# Patient Record
Sex: Male | Born: 1960 | Race: White | Hispanic: No | Marital: Married | Smoking: Former smoker
Health system: Southern US, Community
[De-identification: ages and names within clinical notes are randomized; demographics above are authoritative.]

## PROBLEM LIST (undated history)

## (undated) DIAGNOSIS — R609 Edema, unspecified: Secondary | ICD-10-CM

## (undated) DIAGNOSIS — E119 Type 2 diabetes mellitus without complications: Secondary | ICD-10-CM

## (undated) DIAGNOSIS — D649 Anemia, unspecified: Secondary | ICD-10-CM

## (undated) DIAGNOSIS — J90 Pleural effusion, not elsewhere classified: Secondary | ICD-10-CM

## (undated) DIAGNOSIS — Z8719 Personal history of other diseases of the digestive system: Secondary | ICD-10-CM

## (undated) DIAGNOSIS — E669 Obesity, unspecified: Secondary | ICD-10-CM

## (undated) DIAGNOSIS — I219 Acute myocardial infarction, unspecified: Secondary | ICD-10-CM

## (undated) DIAGNOSIS — E7800 Pure hypercholesterolemia, unspecified: Secondary | ICD-10-CM

## (undated) DIAGNOSIS — I10 Essential (primary) hypertension: Secondary | ICD-10-CM

## (undated) DIAGNOSIS — I5032 Chronic diastolic (congestive) heart failure: Secondary | ICD-10-CM

## (undated) DIAGNOSIS — I2510 Atherosclerotic heart disease of native coronary artery without angina pectoris: Secondary | ICD-10-CM

## (undated) DIAGNOSIS — I4819 Other persistent atrial fibrillation: Secondary | ICD-10-CM

## (undated) DIAGNOSIS — R140 Abdominal distension (gaseous): Secondary | ICD-10-CM

## (undated) HISTORY — DX: Abdominal distension (gaseous): R14.0

## (undated) HISTORY — DX: Atherosclerotic heart disease of native coronary artery without angina pectoris: I25.10

## (undated) HISTORY — DX: Acute myocardial infarction, unspecified: I21.9

## (undated) HISTORY — DX: Type 2 diabetes mellitus without complications: E11.9

## (undated) HISTORY — DX: Pleural effusion, not elsewhere classified: J90

## (undated) HISTORY — DX: Obesity, unspecified: E66.9

## (undated) HISTORY — DX: Essential (primary) hypertension: I10

## (undated) HISTORY — DX: Personal history of other diseases of the digestive system: Z87.19

## (undated) HISTORY — DX: Edema, unspecified: R60.9

## (undated) HISTORY — DX: Anemia, unspecified: D64.9

## (undated) HISTORY — DX: Pure hypercholesterolemia, unspecified: E78.00

## (undated) HISTORY — DX: Other persistent atrial fibrillation: I48.19

---

## 1994-07-01 HISTORY — PX: CORONARY ANGIOPLASTY: SHX604

## 2001-11-13 ENCOUNTER — Ambulatory Visit (HOSPITAL_COMMUNITY): Admission: RE | Admit: 2001-11-13 | Discharge: 2001-11-13 | Payer: Self-pay | Admitting: Family Medicine

## 2001-11-13 ENCOUNTER — Encounter: Payer: Self-pay | Admitting: Family Medicine

## 2002-06-02 ENCOUNTER — Other Ambulatory Visit: Admission: RE | Admit: 2002-06-02 | Discharge: 2002-06-02 | Payer: Self-pay | Admitting: Dermatology

## 2003-02-22 ENCOUNTER — Emergency Department (HOSPITAL_COMMUNITY): Admission: EM | Admit: 2003-02-22 | Discharge: 2003-02-23 | Payer: Self-pay | Admitting: *Deleted

## 2005-10-24 ENCOUNTER — Encounter: Payer: Self-pay | Admitting: Cardiovascular Disease

## 2005-10-25 ENCOUNTER — Inpatient Hospital Stay (HOSPITAL_COMMUNITY): Admission: AD | Admit: 2005-10-25 | Discharge: 2005-10-29 | Payer: Self-pay | Admitting: Cardiovascular Disease

## 2006-07-01 HISTORY — PX: CARDIAC CATHETERIZATION: SHX172

## 2006-07-01 HISTORY — PX: CORONARY ARTERY BYPASS GRAFT: SHX141

## 2007-04-07 ENCOUNTER — Ambulatory Visit (HOSPITAL_COMMUNITY): Admission: RE | Admit: 2007-04-07 | Discharge: 2007-04-07 | Payer: Self-pay | Admitting: *Deleted

## 2007-04-14 ENCOUNTER — Ambulatory Visit: Payer: Self-pay | Admitting: Surgery

## 2007-04-16 ENCOUNTER — Ambulatory Visit: Payer: Self-pay | Admitting: Surgery

## 2007-04-16 ENCOUNTER — Inpatient Hospital Stay (HOSPITAL_COMMUNITY): Admission: AD | Admit: 2007-04-16 | Discharge: 2007-04-26 | Payer: Self-pay | Admitting: Surgery

## 2007-05-11 ENCOUNTER — Ambulatory Visit (HOSPITAL_COMMUNITY): Admission: RE | Admit: 2007-05-11 | Discharge: 2007-05-11 | Payer: Self-pay | Admitting: Cardiovascular Disease

## 2007-05-12 ENCOUNTER — Ambulatory Visit: Payer: Self-pay | Admitting: Surgery

## 2007-05-27 ENCOUNTER — Ambulatory Visit (HOSPITAL_COMMUNITY): Admission: RE | Admit: 2007-05-27 | Discharge: 2007-05-27 | Payer: Self-pay | Admitting: Cardiovascular Disease

## 2007-06-01 ENCOUNTER — Ambulatory Visit (HOSPITAL_COMMUNITY): Admission: RE | Admit: 2007-06-01 | Discharge: 2007-06-01 | Payer: Self-pay | Admitting: Cardiovascular Disease

## 2007-06-02 ENCOUNTER — Ambulatory Visit: Payer: Self-pay | Admitting: Thoracic Surgery (Cardiothoracic Vascular Surgery)

## 2007-06-02 ENCOUNTER — Encounter
Admission: RE | Admit: 2007-06-02 | Discharge: 2007-06-02 | Payer: Self-pay | Admitting: Thoracic Surgery (Cardiothoracic Vascular Surgery)

## 2007-06-16 ENCOUNTER — Encounter
Admission: RE | Admit: 2007-06-16 | Discharge: 2007-06-16 | Payer: Self-pay | Admitting: Thoracic Surgery (Cardiothoracic Vascular Surgery)

## 2007-06-16 ENCOUNTER — Ambulatory Visit: Payer: Self-pay | Admitting: Thoracic Surgery (Cardiothoracic Vascular Surgery)

## 2007-06-22 ENCOUNTER — Encounter (HOSPITAL_COMMUNITY): Admission: RE | Admit: 2007-06-22 | Discharge: 2007-07-01 | Payer: Self-pay | Admitting: Cardiovascular Disease

## 2007-07-03 ENCOUNTER — Encounter (HOSPITAL_COMMUNITY): Admission: RE | Admit: 2007-07-03 | Discharge: 2007-08-02 | Payer: Self-pay | Admitting: Cardiovascular Disease

## 2007-07-20 ENCOUNTER — Ambulatory Visit (HOSPITAL_COMMUNITY): Admission: RE | Admit: 2007-07-20 | Discharge: 2007-07-20 | Payer: Self-pay | Admitting: Cardiovascular Disease

## 2007-08-20 ENCOUNTER — Ambulatory Visit (HOSPITAL_COMMUNITY): Admission: RE | Admit: 2007-08-20 | Discharge: 2007-08-20 | Payer: Self-pay | Admitting: Cardiovascular Disease

## 2008-03-14 ENCOUNTER — Ambulatory Visit: Payer: Self-pay | Admitting: Internal Medicine

## 2008-05-04 ENCOUNTER — Ambulatory Visit: Payer: Self-pay | Admitting: Internal Medicine

## 2009-02-27 DIAGNOSIS — Z862 Personal history of diseases of the blood and blood-forming organs and certain disorders involving the immune mechanism: Secondary | ICD-10-CM | POA: Insufficient documentation

## 2009-02-27 DIAGNOSIS — K219 Gastro-esophageal reflux disease without esophagitis: Secondary | ICD-10-CM | POA: Insufficient documentation

## 2009-02-27 DIAGNOSIS — R197 Diarrhea, unspecified: Secondary | ICD-10-CM | POA: Insufficient documentation

## 2009-02-27 DIAGNOSIS — R12 Heartburn: Secondary | ICD-10-CM | POA: Insufficient documentation

## 2009-02-28 ENCOUNTER — Ambulatory Visit: Payer: Self-pay | Admitting: Internal Medicine

## 2009-03-02 ENCOUNTER — Encounter: Payer: Self-pay | Admitting: Internal Medicine

## 2009-03-20 ENCOUNTER — Ambulatory Visit: Payer: Self-pay | Admitting: Internal Medicine

## 2009-03-20 ENCOUNTER — Ambulatory Visit (HOSPITAL_COMMUNITY): Admission: RE | Admit: 2009-03-20 | Discharge: 2009-03-20 | Payer: Self-pay | Admitting: Internal Medicine

## 2009-03-20 ENCOUNTER — Encounter: Payer: Self-pay | Admitting: Internal Medicine

## 2009-04-03 ENCOUNTER — Encounter: Payer: Self-pay | Admitting: Internal Medicine

## 2009-05-01 ENCOUNTER — Ambulatory Visit: Payer: Self-pay | Admitting: Gastroenterology

## 2009-05-11 ENCOUNTER — Encounter: Payer: Self-pay | Admitting: Internal Medicine

## 2009-05-22 ENCOUNTER — Ambulatory Visit (HOSPITAL_COMMUNITY): Admission: RE | Admit: 2009-05-22 | Discharge: 2009-05-22 | Payer: Self-pay | Admitting: Internal Medicine

## 2009-05-23 ENCOUNTER — Telehealth: Payer: Self-pay | Admitting: Urgent Care

## 2009-05-31 ENCOUNTER — Telehealth (INDEPENDENT_AMBULATORY_CARE_PROVIDER_SITE_OTHER): Payer: Self-pay | Admitting: *Deleted

## 2010-07-22 ENCOUNTER — Encounter: Payer: Self-pay | Admitting: Surgery

## 2010-10-05 LAB — OVA AND PARASITE EXAMINATION: Ova and parasites: NONE SEEN

## 2010-10-05 LAB — CLOSTRIDIUM DIFFICILE EIA: C difficile Toxins A+B, EIA: NEGATIVE

## 2010-10-05 LAB — FECAL LACTOFERRIN, QUANT

## 2010-10-05 LAB — STOOL CULTURE

## 2010-11-13 NOTE — Assessment & Plan Note (Signed)
OFFICE VISIT   [name], [name]  DOB:  [birth year]                                        June 02, 2007  CHART #:  [id number]   ADDENDUM:  The patient has pointed out that he has had persistent hypertension and  elevated heart rate since his surgery.  He says every time he has been  checked his blood pressure has been elevated, and his heart rate has  been around 100.  He has been on Lopressor 25 [name].i.d.  I am going to  increase his Lopressor dose to 50 [name].i.d. to see if we can get his blood  pressure and heart rate more under control.   Salvatore Decent Dorris Fetch, M.D.  Electronically Signed   SCH/MEDQ  D:  06/02/2007  T:  06/02/2007  Job:  [id number]

## 2010-11-13 NOTE — Consult Note (Signed)
[name], [name]                 ACCOUNT NO.:  [id number]   MEDICAL RECORD NO.:  [id number]          PATIENT TYPE:  INP   LOCATION:  2032                         FACILITY:  [facility]   PHYSICIAN:  Alfonse Alpers. Gegick, M.D.DATE OF BIRTH:  05/15/1961   DATE OF CONSULTATION:  DATE OF DISCHARGE:                                 CONSULTATION   HISTORY:  This is a 50 year old man admitted to the hospital with a  history of arteriosclerotic heart disease.  Approximately 1-1/2 years  ago he had an evaluation for coronary disease and was found to have  atherosclerotic disease and was treated with one stent.  Since then, he  has had an increased number of stents to be replaced and is scheduled  for a coronary artery bypass graft.   His risk factors primarily revolve around a history of metabolic  syndrome associated with cigarette use.  He used to smoke a pack and a  half of cigarettes per day but stopped 18 months ago.  He has a history  of diabetes which has been present for approximately 10 years.  This has  never been well controlled.  He also has a history of dyslipidemia for  which he is taking Lipitor and Niaspan.  The patient also has a history  of hypertension which apparently has been controlled.  His A1c is 8.4 in  the hospital, and his blood sugars range usually less than 200 in the  hospital at this time.   PERSONAL HISTORY:  He used to smoke but discontinued this a year and a  half ago.  He denies excessive alcoholic intake.  No history of  allergies.   REVIEW OF SYSTEMS:  His weight has increased approximately 15-20 pounds  over the last year and a half.  He does not know what his A1c's have  been during the interim either.  GI:  No complaints.  GU:  No  complaints.   PHYSICAL EXAMINATION:  GENERAL:  This is a well-developed obese man in  no distress.  LUNGS:  Clear.  HEART:  Normal.  ABDOMEN:  Negative.   IMPRESSION:  1. Arteriosclerotic heart disease.  2. Diabetes  mellitus, type 2.  3. History of hypertension.  4. History of dyslipidemia.   MEDICATIONS PRIOR TO ADMISSION:  Plavix, Lopressor, Isordil, Avandia 8  mg daily, glipizide 5 mg daily, metformin 1000 mg b.i.d.   DISCUSSION:  He has type 2 diabetes associated with his coronary artery  disease.  The metabolic syndrome is exacerbated by his history of  cigarette use.  At this time, I would change some of his medications for  the reasons below.  Avandia will be switched to Actos.  There are  several studies now that have raised some question about Avandia with  reference to coronary artery disease.  This will be switched to Actos.  In addition, the Lipitor should be at maximum dose which it is now at 80  mg.  Niaspan will be discontinued and started later if needed.  Hopefully with the use of Actos, we will decrease the triglycerides and  increase  the HDL such that we will not need the  Niaspan.  Niaspan will increase insulin resistance.  Glipizide will also  be discontinued.  Again with this medication, there are some questions  about its role in coronary artery disease.  Januvia will be replaced  instead.  Thank you for the opportunity in seeing this patient.  We will  follow this patient.           ______________________________  Alfonse Alpers. Dagoberto Ligas, M.D.     CGG/MEDQ  D:  04/18/2007  T:  04/20/2007  Job:  [id number]

## 2010-11-13 NOTE — Assessment & Plan Note (Signed)
OFFICE VISIT   ROLLEN, SELDERS  DOB:  [birth year]                                        June 16, 2007  CHART #:  [id number]   Mr. Stellmach is a 50 year old gentleman who had coronary bypass grafting  x4 by Dr. Laneta Simmers back in October.  He had been seen by Dr. Laneta Simmers in  November, at which time he had a little bit of left basilar atelectasis,  but was really making good progress.  He subsequently returned in early  December with a large left pleural effusion.  I saw him at that time as  Dr. Laneta Simmers was out, and recommended a thoracentesis.  We had to stop  that short because he experienced a great deal of discomfort, but we  were able to remove approximately 50% - 60% of the pleural fluid, which  was about 800 cc.  He then was treated with diuretics.  He states that  since that time, he has had marked improvement in his breathing and has  not noted any shortness of breath or orthopnea.  He still has Lasix  tablets.  He says he takes one about every 2-3 days, when he notices  that his weight is increasing and he has noted some peripheral edema as  well.   PHYSICAL EXAMINATION:  On physical examination, Mr. Schue is a well-  appearing 50 year old white male, in no acute distress.  His blood  pressure is 138/95, pulse 99, respirations are 18, his oxygen saturation  is 98% on room air.  Lungs are essentially equal bilaterally, with no  wheezing.  There are some minimal crackles in the left base.  There is  trace peripheral edema.   His chest x-ray shows marked improvement of his left pleural effusion.  There is still a small residual effusion, with some blunting of the  costophrenic angle, but overall there is much improved aeration of the  left lung.   IMPRESSION:  Mr. Elizondo is doing well at this point in time.  His left  pleural effusion has improved markedly with thoracentesis and diuretics.  He still is taking diuretics on an as-needed basis, watching  his weight.  He does not need any further specific followup regarding the pleural  effusion, but should just continue to follow up with Dr. [name] as  scheduled.  We would be happy to see him back at any time if we could be  of any further assistance with his care.   Salvatore Decent Dorris Fetch, M.D.  Electronically Signed   SCH/MEDQ  D:  06/16/2007  T:  06/16/2007  Job:  [id number]   cc:   Evelene Croon, M.D.  Richard A. [name], M.D.  Mila Homer. Sudie Bailey, M.D.

## 2010-11-13 NOTE — Assessment & Plan Note (Signed)
OFFICE VISIT   [name], [name]  DOB:  1961-06-25                                        June 02, 2007  CHART #:  [id number]   The patient is a 50 year old gentleman who had coronary artery bypass  grafting x4 on October 22 of this year by Dr. Laneta Simmers.  He had some  postoperative atrial fibrillation and was discharged home on amiodarone.  He returns today because he has been having persistent problems with his  breathing.  He does get short of breath.  He does have some orthopnea  and some cough although that has not been severe.  He continued to use  his incentive spirometer, and actually says over the last few days, he  has noted some improvement.  He was noted about a week ago to have a  large left pleural effusion on chest x-ray and had his Lasix dose  increased.  He also had some peripheral edema at that time.  His  peripheral edema since has resolved, but other than some slight  improvement, his shortness of breath has not.   PHYSICAL EXAMINATION:  The patient is a 50 year old white male in no  acute distress.  His blood pressure is 148/93, pulse 102, respirations  18, oxygen saturation 97% on room air.  His lungs have diminished breath  sounds with dullness to percussion at the left base.  Otherwise, clear.  There is no peripheral edema.   Chest x-ray shows a moderately large left pleural effusion with  compressive atelectasis.   IMPRESSION:  The patient is a 50 year old gentleman who is now about 6  weeks out from coronary artery bypass grafting by Dr. Laneta Simmers.  He has a  left pleural effusion.  He has had some improvement with increasing  diuretics, but the effusion has not changed in size since his diuretics  were increased and I recommend that he have a left thoracentesis to  evacuate the fluid.  I discussed with them the indications, risks,  benefits, and alternatives.  They understand the risk, including  bleeding and pneumothorax.  They do  understand that there is the  possibility of need for repeat thoracentesis in approximately 10% of  patients.   PROCEDURE NOTE:  Using local anesthetic and sterile technique, left  thoracentesis was performed.  Approximately 600 mL of fluid was removed  and the patient became light-headed and very anxious.  The procedure was  terminated at that point.  We will proceed with a chest x-ray and then  plan to follow him up in 2 weeks with a repeat chest x-ray.  He will  continue with the 80 mg of Lasix [name].i.d. in the interim.   Salvatore Decent Dorris Fetch, M.D.  Electronically Signed   SCH/MEDQ  D:  06/02/2007  T:  06/02/2007  Job:  [id number]   cc:   Gerlene Burdock A. [name], M.D.  Mila Homer. Sudie Bailey, M.D.

## 2010-11-13 NOTE — Consult Note (Signed)
NEW PATIENT CONSULTATION   [name]  DOB:  03-11-1961                                        April 14, 2007  CHART #:  [id number]   REASON FOR CONSULTATION:  Severe three-vessel coronary artery disease  with unstable angina.   CLINICAL HISTORY:  I was asked to evaluate this gentleman for  consideration of coronary artery bypass graft surgery, by Dr. Pearletha Furl.  [name].  He is a 50 year old gentleman with a history of premature  coronary artery disease starting at age 51 in 81, when he underwent a  stenting for a high-grade proximal LAD stenosis with a bare metal stent.  He had suffered an anterior wall subendocardial myocardial infarction at  that time.  He was treated medically but had recurrent angina in 2007,  and underwent repeat catheterization on October 25, 2005, showing widely  patent proximal LAD stent with progression of disease in his mid-LAD to  85%-90% stenosis, as well as 95% stenosis in the mid-posterolateral  branch.  He underwent staged intervention with a TAXUS stent to the  proximal right coronary artery and mid-posterolateral branch.  On October 28, 2005, he underwent overlapping CYPHER stents to the mid-LAD, across  the second diagonal branch.  He has been treated medically since then  and has done well until the past four to six weeks, when he has  developed intermittent substernal chest pain with exertion.  He has also  had bilateral upper extremity aching pain, particularly in his upper  arms.  This initially occurred with exertion, but he has also been  having symptoms at rest.  He does have associated shortness of breath  and some palpitations.  He underwent a cardiac catheterization on  April 10, 2007.  This showed about a 40%-50% stent restenosis in the  proximal LAD stent.  Beyond the stent there was aneurysmal dilatation  with 85% stenosis of the LAD involving the first diagonal branch.  Further down, the stent that was  across the second diagonal branch, has  mild in-stent re-stenosis.  The second diagonal branch itself has about  90% proximal stenosis.  The left circumflex gives off a moderate-sized  first marginal and has a 70%-80% proximal stenosis.  There is a  posterolateral branch that has about 95% proximal stenosis.  The right  coronary artery has 50%-60% proximal and mid-stenosis.  There is less  than 30% stenosis within the posterolateral stent.  The left ventricular  ejection fraction was normal.  The end diastolic pressure was 18.  There  was no gradient across the aortic valve.  There was no mitral  regurgitation.   REVIEW OF SYSTEMS:  GENERAL:  He denies any fevers or chills.  He has  had no recent weight changes, but has gained weight since he quit  smoking in April 2007.  He does report fatigue.  He has continued to  work full-time.  EYES:  Negative.  ENT:  Negative.  ENDOCRINE:  He has  adult onset diabetes since age 63.  He denies hypothyroidism.  CARDIOVASCULAR:  As above.  He has had exertional and rest chest  pressure and pain.  He has had radiation into his upper arms.  He has  had exertional shortness of breath.  He reports palpitations.  He denies  peripheral edema.  RESPIRATORY:  He denies cough  and sputum production.  GI:  He has some reflux symptoms.  He denies nausea and vomiting.  He  has had no melena or bright red blood per rectum.  GENITOURINARY:  He  does have urinary frequency.  He denies dysuria and hematuria.  VASCULAR:  Denies claudication and phlebitis.  NEUROLOGIC:  He has had  some dizziness recently.  He denies any focal weakness or numbness.  He  has never had a TIA or a stroke.  MUSCULOSKELETAL:  He does have joint  pains.  PSYCHIATRIC:  Negative.  HEMATOLOGICAL:  Negative.   ALLERGIES:  PROTONIX causes diarrhea.   CURRENT MEDICATIONS:  1. Cozaar 100 mg daily.  2. Avandia 8 mg daily.  3. A multivitamin daily.  4. Metformin 1000 mg twice daily.  5.  Glucosamine 1500 mg daily.  6. Plavix 75 mg daily.  7. Lipitor 80 mg daily.  8. Naprosyn 500 mg twice daily.  9. Niaspan ER 500 mg twice daily.  10.Glipizide 5 mg daily.  11.Lopressor 25 mg twice daily.  12.Nexium 40 mg daily.  13.Imdur 30 mg daily.  14.Nitroglycerin spray p.r.n.  15.Keflex 500 mg four times daily, which was just recently started for      a flare up of infection under his arms.   PAST MEDICAL HISTORY:  1. Significant for coronary artery disease, as mentioned above.      Status post multiple percutaneous interventions.  2. History of hypertension.  3. History of hyperlipidemia.  4. He is status post ORIF of a left leg fracture in the past with slow      healing.  5. 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  These have always responded to azithromycin.   FAMILY HISTORY:  Strongly positive for coronary artery disease.  His  father has had coronary artery bypass graft surgery, as well as carotid  artery surgery.   SOCIAL HISTORY:  He is married and has one child.  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  He quit smoking in  April 2007, and denies alcohol abuse.  He weighs about 280 pounds and is  6 feet tall.   PHYSICAL EXAMINATION:  VITAL SIGNS:  Blood pressure 145/98, pulse 74 and  regular, respirations 18 and unlabored, oxygen saturation on room air  96%.  GENERAL:  He is an obese, large-framed white male, in no distress.  HEENT:  Normocephalic and atraumatic.  Pupils equal, react to light and  accommodation.  His extraocular muscles are intact.  Throat is clear.  Teeth in good condition.  NECK:  Normal carotid pulses bilaterally.  There are no bruits.  There  is no adenopathy or thyromegaly.  HEART:  A regular rate and rhythm with normal S1 and S2.  There is no  murmur, rub or gallop.  LUNGS:  Clear.  ABDOMEN:  Shows active bowel sounds.  His abdomen is obese.  He does  have  diastasis recti.  There are no palpable masses or organomegaly.  EXTREMITIES:  Shows no peripheral edema.  Pedal pulses palpable  bilaterally.  SKIN:  Warm and dry.  NEUROLOGIC:  Alert and oriented x3.  Motor and sensory exams are grossly  normal.   IMPRESSION/RECOMMENDATIONS:  1. Mr. Venard has severe three-vessel coronary artery disease with      high-grade proximal left anterior descending coronary artery,      diagonal bifurcation stenosis.  He has unstable  anginal symptoms      and is now having chest and upper arm pain at rest.  I agree that      coronary artery bypass graft surgery is the best treatment to      prevent further ischemia and infarction.  He has been on Plavix      chronically for multiple drug-coated stents.  With his unstable      anginal symptoms, I would be hesitant to stop his Plavix prior to      proceeding with surgery.  We will plan to admit him for Integrilin      therapy while his Plavix is discontinued for at least five days.  2. 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  I started him on azithromycin today, to try to      get this treated prior to surgery.  It has always resolved before      with that therapy.   I discussed the operative procedure of coronary artery bypass graft  surgery with him and with his wife.  We discussed alternatives, benefits  and risks including bleeding, blood transfusion, infection, stroke,  myocardial infarction, graft failure, renal failure and death.  He  understands and  would like to proceed with surgery.  We will  plan to admit him on  Thursday for Integrilin therapy and will discontinue his Plavix at that  time and plan to do surgery next Tuesday.   Evelene Croon, M.D.  Electronically Signed   BB/MEDQ  D:  04/14/2007  T:  04/15/2007  Job:  [id number]   cc:   Gerlene Burdock A. [name], M.D.  Mila Homer. Sudie Bailey, M.D.

## 2010-11-13 NOTE — Assessment & Plan Note (Signed)
[name], [name]                  CHART#:  [id number]   DATE:  05/04/2008                       DOB:  1961/03/23   Follow up gastroesophageal reflux disease, diarrhea, iron deficiency  anemia, Hemoccult negative.   The patient returns to see today after orginal consultation on March 14, 2008.  He had problems with diarrhea at least temporarily related to  a variety of proton pump inhibitor agents.  He had now been started on  Aciphex.  We started him on Aciphex 20 mg orally daily.  Nearly, at the  same time Dr. [name] backed off on his Bystolic to one-half of a 10  mg tablet daily.  These maneuvers were associated with essentially  resolution of his diarrhea.  His reflux symptoms are under great control  with the Aciphex.  He has had longstanding reflux symptoms for a number  of years.  No odynophagia.  No dysphagia.  No melena.  No rectal  bleeding.  He has never had his lower GI tract evaluated.  There is no  family history of colon cancer.  Overall, he is doing very well at this  point from a GI standpoint.  He has lost 40 pounds since his bypass  surgery.   CURRENT MEDICATIONS:  See updated list.   ALLERGIES:  No known drug allergies.   PHYSICAL EXAMINATION:  Today, he looks well.  Weight 265, height 6 feet  10 inches, temperature 97.8, BP 118/82, and pulse 60.  A detailed exam  today is deferred.   ASSESSMENT:  1. Gastroesophageal reflux disease, now well controlled on Aciphex 20      mg orally daily.  He is tolerating this agent very well with no      apparent side effects at this time.  2. History of iron deficiency anemia, Hemoccult negative.   No GI evaluation thus far.   RECOMMENDATIONS:  I have recommended to the patient as he go ahead and  have both a diagnostic colonoscopy and an EGD in the near future to  further evaluate his longstanding gastroesophageal reflux disease and  iron deficiency anemia.  Risks, benefits, alternatives, and limitations  of  this approach have been reviewed.  He is the primary caregiver for  his ailing father and has to get some issues taken care of with him  before he signs up for an endoscopic evaluation.  He states that he need  a little time and wanted to get this done between now and  January 2010.  I told him we would be on stand and as soon as he wanted  to proceed we can get him set up at [facility].  He is to let  us know.       Jonathon Bellows, M.D.  Electronically Signed     RMR/MEDQ  D:  05/04/2008  T:  05/04/2008  Job:  [id number]   cc:   Gerlene Burdock A. [name], M.D.

## 2010-11-13 NOTE — Discharge Summary (Signed)
[name], [name]                 ACCOUNT NO.:  [id number]   MEDICAL RECORD NO.:  [id number]          PATIENT TYPE:  INP   LOCATION:  2003                         FACILITY:  [facility]   PHYSICIAN:  Evelene Croon, M.D.     DATE OF BIRTH:  May 19, 1961   DATE OF ADMISSION:  04/16/2007  DATE OF DISCHARGE:  04/26/2007                               DISCHARGE SUMMARY   FINAL DIAGNOSIS:  Severe three vessel coronary artery disease with  unstable angina.   IN-HOSPITAL DIAGNOSES:  1. Postoperative atrial fibrillation.  2. Postoperative acute blood loss anemia.  3. Volume overload postoperatively.   SECONDARY DIAGNOSES:  1. Hypertension.  2. Dyslipidemia.  3. Diabetes mellitus type 2.   IN-HOSPITAL OPERATIONS AND PROCEDURES:  1. Coronary artery bypass grafting x4 using a left internal mammary      artery graft to left anterior descending coronary artery, saphenous      vein graft to first diagonal branch of the left anterior descending      artery, saphenous vein graft to first obtuse marginal branch of      left circumflex coronary artery, saphenous vein graft to posterior      descending coronary artery.  2. Endoscopic vein harvesting from right leg.   HISTORY AND PHYSICAL AND HOSPITAL COURSE:  The patient is a 50 year old  gentleman referred by Dr. [name] who has a history of premature  coronary artery disease starting at age 67 in 89 when he underwent  stenting of a high-grade proximal LAD stenosis with a bare metal stent.  He has sustained anterior myocardial infarction at that time.  The  patient was treated medically but had recurrent angina in 2007.  He  underwent repeat cardiac catheterization in April of 2007 which showed  high-grade right coronary stenosis.  He underwent staged intervention  with a Taxus stent to the proximal right coronary artery in the mid  posterior lateral branch.  He then subsequently underwent overlapping  Cypher stents to the mid LAD, first and  second diagonal branch.  He has  been treated medically since and did well until the past 4-6 weeks.  The  patient at that point developed intermittent substernal chest pain with  exertion.  He also had bilateral upper extremity aching pain.  He  underwent repeat cardiac catheterization April 10, 2007, which showed  40-50% in stent restenosis in the proximal LAD stent.  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  Further down the stent crossed the  second diagonal branch and mid in stent restenosis.  Second diagonal  branch had 90% proximal stenosis.  Left circumflex developed some  moderate sized first marginal and had 78% proximal stenosis.  There is a  posterior lateral branch of the distal circumflex that had 95% proximal  stenosis.  The right coronary artery had 50-60% proximal mid stenosis.  There was less than 30% within the posterior lateral stent.  Left  ventricular ejection fraction was normal.  The patient was then referred  to Dr. Laneta Simmers.  Dr. Laneta Simmers saw and evaluated the patient.  He discussed  with the patient undergoing coronary artery bypass grafting.  He  discussed risks and benefits with the patient.  The patient acknowledged  his understanding and agreed to proceed.  The plan was to admit the  patient for intravenous Integrilin therapy with Plavix washing out for  about 5 days prior to undergoing surgery.  The plan was to admit the  patient April 16, 2007, and proceed with surgery April 22, 2007.  The patient agreed to this plan.  For details of the patient's past  medical history and physical exam please see dictated H and P.   The patient was admitted to [facility] on April 16, 2007.  At  this time his Plavix was discontinued and he was started on Integrilin.  The patient remained stable preoperatively.  Dr. Dagoberto Ligas was consulted to  assist in managing the patient's  diabetes mellitus preoperatively as  well as postoperatively.  The patient did have bilateral carotid duplex  ultrasound done preoperatively showing no ICA stenosis.  He also had  bilateral ABIs done showing to be bilaterally greater than 1.0.  The  patient had no chest pain or shortness of breath during his preoperative  course.  He was scheduled and ready for surgery for April 22, 2007.   The patient was taken to the operating room on April 22, 2007, where  he underwent coronary artery bypass grafting x4 using a left internal  mammary artery graft to the left anterior descending coronary artery,  saphenous vein graft to the first diagonal branch of the left anterior  descending artery, saphenous vein graft to the first obtuse marginal  branch of the left circumflex coronary artery, saphenous vein graft to  the posterior descending coronary artery.  Endoscopic vein harvesting  from the right leg was done.  The patient tolerated this procedure well  and was transferred to the intensive care unit in stable condition.  Postoperatively the patient was noted to be hemodynamically stable.  He  was extubated the evening of surgery.  Post extubation the patient was  noted to be alert and oriented x4.  Neuro intact.  Postop day 1 while in  the intensive care unit the patient's vital signs were monitored  closely.  These did remain stable.  Swan-Ganz catheter was discontinued  in the normal fashion.  Postoperative chest x-ray was clear.  The  patient had minimal drainage from chest tubes and chest tubes were  discontinued in normal fashion.  The patient did have slight acute blood  loss anemia with hemoglobin and hematocrit 8.4 and 24%.  He was  asymptomatic and was to be followed closely.  The patient was noted to  be in normal sinus rhythm postop day 1.  He was out of bed ambulating  well with cardiac rehab.  The patient was felt to be stable for transfer  to PCU postoperatively.   While  on telemetry floor the patient went into atrial fibrillation  evening postop day 1 and morning of postop day 2.  He was started on IV  amiodarone.  After starting IV amiodarone the patient was able to  convert back to normal sinus rhythm.  Postop day 2 the patient was  switched to p.o. amiodarone.  During the remainder of his postoperative  course he did remain in normal sinus rhythm.  He was continued on  amiodarone p.o.  The patient's other vital signs remained stable.  He  remained afebrile.  He was able to be weaned off oxygen satting greater  than 90% on room air.  The patient did have slight volume overload and  was started on diuretics.  Daily weights were obtained.  The patient was  back near baseline weight prior to discharge home.  The patient's blood  sugars were also monitored closely.  Dr. Dagoberto Ligas continued to follow the  patient postoperatively.  Prior to discharge home blood sugars were  stable.  The patient was noted to have developed anemia postoperatively  while in the intensive care unit.  Postop day 3 CBC was obtained.  Hemoglobin and hematocrit had dropped further to 7.3 and 21%.  He did  require one unit of packed red blood cells.  Repeat CBC did improve at  7.7 and 22.4% the following day.  This was rechecked in the a.m. and  hematocrit increased further to 24% postop day 5.  The patient was  asymptomatic.  Postop day 5 the patient was noted to be in normal sinus  rhythm.  His pulmonary status continued to improve.  All incisions were  clean, dry and intact and healing well.  He was out of bed and  ambulating well without difficulty.  He was tolerating diet well.  No  nausea or vomiting noted.  External pacing wires were discontinued in  normal fashion.  The patient was felt to be stable and ready for  discharge home postop day 5 on April 26, 2007.   FOLLOWUP APPOINTMENTS:  A followup appointment was arranged with Dr.  Laneta Simmers for May 12, 2007, at 11:45 a.m.   The patient will need to  obtain PA and lateral chest x-ray 30 minutes prior to this appointment.  The patient will need to follow up with Dr. [name] in 2 weeks.  He  will need to contact the office to make these arrangements.   ACTIVITY:  The patient was instructed no driving until released to do  so, no heavy lifting over 10 pounds.  He was told to ambulate 4x per day  and progress as tolerated and to continue his breathing exercises.   INCISIONAL CARE:  The patient is told to shower, washing his incisions  using soap water.  He is to contact the office if he develops any  drainage or opening from any of his incision sites.   DIET:  The patient was educated on diet to be low-fat, low-salt as well  as modified medium calorie diet.   DISCHARGE MEDICATIONS:  1. Aspirin 325 mg daily.  2. Lopressor 25 mg b.i.d.  3. Crestor 40 mg at night.  4. Januvia 100 mg daily.  5. Amiodarone 200 mg 3x per day.  6. Lasix 40 mg daily.  7. Potassium chloride 20 mEq daily.  8. Plavix 75 mg daily.  9. Metformin 1000 mg b.i.d.  10.Actos 45 mg daily.  11.Oxycodone 5 mg one to two tabs q.4-6 h p.r.n. pain.      Theda Belfast, [place]      Evelene Croon, M.D.  Electronically Signed    KMD/MEDQ  D:  05/14/2007  T:  05/15/2007  Job:  [id number]   cc:   Evelene Croon, M.D.  Richard A. [name], M.D.

## 2010-11-13 NOTE — Op Note (Signed)
[name], [name]                 ACCOUNT NO.:  [id number]   MEDICAL RECORD NO.:  [id number]          PATIENT TYPE:  INP   LOCATION:  2306                         FACILITY:  [facility]   PHYSICIAN:  Evelene Croon, M.D.     DATE OF BIRTH:  11-04-1960   DATE OF PROCEDURE:  04/22/2007  DATE OF DISCHARGE:                               OPERATIVE REPORT   PREOPERATIVE DIAGNOSIS:  Severe three-vessel coronary disease with  unstable angina.   POSTOPERATIVE DIAGNOSIS:  Severe three-vessel coronary disease with  unstable angina.   OPERATIVE PROCEDURES:  1. Median sternotomy.  2. Extracorporeal circulation.  3. Coronary artery bypass graft surgery x4 using a left internal      mammary artery graft to the left anterior descending coronary      artery, with a saphenous vein graft to the first diagonal branch of      the left anterior descending artery, a saphenous vein graft to the      first obtuse marginal branch of the left circumflex coronary      artery, and a saphenous vein graft to the posterior descending      coronary artery.  4. Endoscopic vein harvesting from the right leg.   ATTENDING SURGEON:  Evelene Croon, M.D.   ASSISTANT:  Kerin Perna, M.D.   SECOND ASSISTANT:  Lenise Herald, [facility].   ANESTHESIA:  General endotracheal.   CLINICAL HISTORY:  This patient is a 50 year old gentleman referred by  Dr. Susa Griffins, who has a history of premature coronary disease  starting at age 45 in 39 when he underwent stenting of a high-grade  proximal LAD stenosis with a bare metal stent.  He had suffered an  anterior myocardial infarction at that time.  He was treated medically  but had recurrent angina in 2007 and underwent repeat catheterization in  April 2007, which showed high-grade right coronary stenosis.  He  underwent staged intervention with a Taxus stent to the proximal right  coronary artery and the mid posterolateral branch.  Then he subsequently  underwent overlapping  Cypher stents to the mid LAD across the second  diagonal branch.  He has been treated medically since then and did well  until the past 4-6 weeks, when he developed intermittent substernal  chest pain with exertion.  He also had bilateral upper extremity aching  pain.  This began occurring with exertion initially but then progressed  to having intermittent rest symptoms.  He underwent repeat  catheterization on April 10, 2007, which showed a 40-50% in-stent  restenosis in the proximal LAD stent.  Just beyond the stent there was  an aneurysmal dilatation of 85% stenosis of the LAD involving the first  diagonal branch.  Further down the stent that was across second diagonal  branch had mild in-stent restenosis.  The second diagonal branch itself  had about 90% proximal stenosis and was a small vessel.  The left  circumflex gave off of a moderate-sized first marginal that had 70-80%  proximal stenosis.  There is posterolateral branch off the distal left  circumflex that had about 95% proximal  stenosis but was a relatively  small vessel.  The right coronary artery had 50-60% proximal and mid  stenosis.  There was less than 30% stenosis within the posterolateral  stent.  Left ventricular ejection fraction was normal.  There was no  gradient across the aortic valve and no mitral regurgitation.  After  review of the angiogram and examination of the patient, it was felt that  coronary artery bypass graft surgery was the best treatment to prevent  further ischemia and infarction.  Given the patient's symptoms at rest  and the appearance of the proximal LAD stent with aneurysmal dilatation  and stenosis just beyond it, I felt it would be best to admit the  patient for intravenous Integrilin therapy while his Plavix was washing  out for about 5 days.  I discussed the plan with the patient and his  wife, including surgery.  We discussed the alternatives, benefits, and  risks, including but not  limited to bleeding, blood transfusion,  infection, stroke, myocardial infarction, graft failure, and death.  He  understood and agreed to proceed.  He was admitted to the hospital and  started on Integrilin and his Plavix was discontinued for 5 days prior  to surgery.  He remained stable without symptoms.   OPERATIVE PROCEDURE:  The patient was taken to the operating room and  placed on the table in supine position.  After induction of general  endotracheal anesthesia, a Foley catheter was placed in the bladder  using sterile technique.  Then the chest, abdomen and both lower  extremities were prepped and draped in the usual sterile manner.  The  chest was entered through a median sternotomy incision and the  pericardium opened in the midline.  Examination of the heart showed good  ventricular contractility.  The ascending aorta had no palpable plaques  in it.   Then the left internal mammary artery was harvested from the chest wall  as pedicle graft.  This was a medium-caliber vessel with excellent blood  flow through it.  At the same time a segment of greater saphenous vein  was harvested from the right leg using endoscopic vein harvest  technique.  This vein was of medium size and good quality.  There was  large branch in the mid thigh which could not be ligated safely using  the endoscopic system, and therefore a second counter incision was made  in the mid thigh to localize this branch and ligate it.   Then the patient was heparinized and when an adequate ACT was obtained,  the distal ascending aorta was cannulated using a 22-French aortic  cannula for arterial inflow.  Venous outflow was achieved using a two-  stage venous cannula through the right atrial appendage.  An antegrade  cardioplegia and vent cannula was inserted in the aortic root.   The patient was placed on cardiopulmonary bypass and the distal coronary  arteries identified.  The LAD was heavily diseased in its  proximal  portion.  The mid vessel had segmental plaque present.  The distal third  of the vessel appeared fairly free of disease.  The first diagonal  branch was a moderate-sized, graftable vessel.  The second diagonal  branch was small and diffusely diseased and not felt to be graftable.  The first marginal was intramyocardial but located in its midportion,  where it was a large, graftable vessel.  The second marginal was visible  on the surface the heart and had no apparent disease.  The distal  left  circumflex terminated as a posterolateral branch that was small and not  felt to be graftable.  The right coronary artery gave off a high  posterior descending branch that came off just above the acute margin  and then crossed over the acute marginal into the inferior wall.  This  had mild disease in it.  The continuation of the right coronary artery  was palpable and diffusely diseased and there was a stent palpable in it  distally.  It was not possible to get beyond the stented area to graft  the distal right coronary artery.  The posterolateral branch itself was  small and non-graftable.  The stent was patent with less than 30%  stenosis noted on angiogram.   Then the aorta was crossclamped and 1000 mL of cold blood antegrade  cardioplegia was administered in the aortic root with quick arrest the  heart.  Systemic hypothermia to 28 degrees centigrade and topical  hypothermia with iced saline was used.  A temperature probe was placed  in septum, an insulating pad in the pericardium.   The first distal anastomosis was performed to the obtuse marginal  branch.  The internal diameter of this vessel was about 1.75 mm.  The  conduit used was a segment of greater saphenous vein and the anastomosis  performed in an end-to-side manner using continuous 7-0 Prolene suture.  Flow was noted through the graft and was excellent.   The second distal anastomosis was performed to the posterior  descending  branch.  The internal diameter was about 1.75 mm.  The conduit used was  a second segment of greater saphenous vein and the anastomosis performed  in a end-to-side manner using continuous 7-0 Prolene suture.  Flow was  noted through the graft and was excellent.  Then another dose of  cardioplegia was given down the vein grafts and in the aortic root.   The third distal anastomosis was performed to the first diagonal branch.  The internal diameter of this vessel was a 1.6 mm.  The conduit used was  a third segment of greater saphenous vein and the anastomosis performed  in an end-to-side manner using continuous 7-0 Prolene suture.  Flow was  noted through the graft and was excellent.   The fourth distal anastomosis was performed to the distal LAD.  The  internal diameter was 1.75 mm.  The conduit used was the left internal  mammary graft and this brought through an opening in the left  pericardium anterior to the phrenic nerve.  This was anastomosed to the  LAD in an end-to-side manner continuous 8-0 Prolene suture.  The pedicle  was sutured to the epicardium with 6-0 Prolene sutures to prevent  rotation.  Then the patient was rewarmed to 37 degrees centigrade.  With  the crossclamp in place the three proximal vein graft anastomoses were  performed to the aortic root in an end-to-side manner using continuous 6-  0 Prolene suture.  Then the clamp was removed from the mammary pedicle.  There was rapid warming of ventricular septum and return of spontaneous  ventricular fibrillation.  The crossclamp was removed with a time of 83  minutes.  The patient defibrillated into sinus rhythm.  The proximal and  distal anastomoses appeared hemostatic and the alignment of the  satisfactory.  Graft markers were placed around the proximal  anastomoses.  Two temporary right ventricular and right atrial pacing  wires were placed and brought through the skin.   When the  patient had rewarmed to  37 degrees centigrade, he was weaned  off cardiopulmonary bypass on no inotropic agents.  Total bypass time  was 106 minutes.  Cardiac function appeared excellent with a cardiac  output of 5 L:min.  Protamine was given and the venous and aortic  cannulas were removed without difficulty.  Hemostasis was achieved.  Three chest tubes were placed with a tube in the posterior pericardium,  one in the left pleural space and one in the anterior mediastinum.  The  sternum was then closed with double #6 stainless steel wires.  The  fascia was closed with continuous #1 Vicryl suture.  The subcutaneous  tissue was closed with continuous 2-0 Vicryl and the skin with a 3-0  Vicryl subcuticular closure.  The lower extremity vein harvest site was  closed in layers in a similar manner.  The sponge, needle and instrument  counts were correct according to the scrub nurse.  Dry sterile dressings  were applied over the incisions and around the chest tubes, which were  hooked to Pleur-Evac suction.  The patient remained hemodynamically  stable and was transported to the SICU in guarded but stable condition.      Evelene Croon, M.D.  Electronically Signed     BB/MEDQ  D:  04/21/2007  T:  04/22/2007  Job:  [id number]   cc:   Gerlene Burdock A. [name], M.D.

## 2010-11-13 NOTE — Telephone Encounter (Signed)
[name], [name]                  CHART#:  [id number]   DATE:  03/14/2008                       DOB:  [birth year]   PHYSICIAN REQUESTING CONSULTATION:  [name] A. [name] Amass, MD   PRIMARY CARE PHYSICIAN:  [name] Homer. Sudie Bailey, MD   REASON FOR CONSULTATION:  Reflux, nausea, diarrhea, and indigestion.   HISTORY OF PRESENT ILLNESS:  The patient is a 50 year old Caucasian  gentleman who presents today for further evaluation of the above-stated  symptoms at the request of Dr. Susa [name].  The patient states  that he has about 2-3 year history of acid reflux.  Initially, he was  started on [name] [name] for a couple of months and did fairly well with  regards to his reflux, but then he started having diarrhea.  He was then  switched to [name] [name].  At one point, he was on [name] [name] which did control  his symptoms well and did not notice any problems with his bowel  movements.  When he was in the hospital last fall with [name], he was  switched to [name] [name] and developed postprandial diarrhea on this.  Most  recently, he was switched to [name] [name] and his diarrhea is better but  still persists.  He predominantly noticed his postprandial watery stools  occurring mostly after breakfast, sometimes after lunch.  Denies any  gross blood per rectum or melena.  He has had multiple stool hemoccults  which have been negative per his report, given his history of anemia  postoperatively.  His hemoglobin has been improving, but he still has a  hemoglobin around 11.4 with an iron level of 44, iron saturation is 13%  which is slightly low.  He states that his heartburn is well controlled.  He denies any dysphagia or odynophagia.  No nausea or vomiting.  Denies  any unintentional weight loss.   CURRENT MEDICATIONS:  1. [name] [name] 40 mg daily.  2. Folic acid 1 mg b.i.d.  3. [name] [name] 500 mg b.i.d.  4. [name] [name] 1000 mg b.i.d.  5. [name] 20 mg daily.  6. [name] [name] 20 mg daily.  7. [name] [name] 20 mEq 1/2 tablet daily.  8.  Bystolic 10 mg daily.  9. [name] [name] 50 mg daily.  10.[name] [name] 100 mg daily.  11.[name] [name] 81 mg daily.  12.[name] [name] 325 mg b.i.d.   ALLERGIES:  No known drug allergies.   PAST MEDICAL HISTORY:  1. Hypertension.  2. Diabetes mellitus.  3. Hypercholesterolemia.  4. Coronary artery disease status post [name] x4 in October 2008.  He      had postoperative paroxysmal atrial fibrillation.  5. He has chronic GERD.  6. Anemia postoperatively with low iron saturation recently.  He has      had a steel rod put in his left leg due to fracture.  No prior      colonoscopy or EGD.   FAMILY HISTORY:  Mother succumb to acetaminophen poisoning at age 57.  No family history of colorectal cancer.   SOCIAL HISTORY:  He is married.  He is employed by [name].  He  is a Production designer, theatre/television/film at [name] in [place].  Quit smoking a couple of  years ago.  Smoked for over 20 years at 1-2 packs a day.  Rarely  consumes beer or wine.   REVIEW OF SYMPTOMS:  See HPI for GI.  Constitutional:  No unintentional  weight loss.  Cardiopulmonary:  Denies chest pain or shortness of  breath.  Genitourinary:  Denies dysuria or hematuria.   PHYSICAL EXAMINATION:  VITAL SIGNS:  Weight 264, height 6 feet, temp  98.5, blood pressure 142/90, and pulse 72.  GENERAL:  A pleasant obese Caucasian male in no acute distress.  SKIN:  Warm and dry.  No jaundice.  HEENT:  Sclerae nonicteric.  Oropharyngeal mucosa moist and pink.  No  lesions, erythema, or exudates.  No lymphadenopathy or thyromegaly.  CHEST:  Lungs are clear to auscultation.  CARDIAC:  Regular rate and rhythm.  No murmurs, rubs, or gallops.  ABDOMEN:  Obese, positive bowel sounds.  Soft and nontender.  No  organomegaly or masses.  No rebound or guarding.  No abdominal bruits.  LOWER EXTREMITIES:  No edema.   LABORATORY DATA:  Labs from July 2009, WBC is 8800, hemoglobin reported  to be 11.4, platelets 228,000, hemoglobin A1c 7.6, iron 44, TRBC 343,  and  iron saturation is 13%.   IMPRESSION:  The patient is a 49 year old gentleman with 2-3 year  history of gastroesophageal reflux disease which has been intermittently  controlled with PPI therapy.  He has had some problems he feels with  diarrhea related to [name] [name], [name] [name], and [name] [name].  He states he did  better on [name] [name].  He has pretty good control of his reflux symptoms  currently on [name] [name].  He denies any alarm symptoms at this time.  He  does have some chronic anemia postoperatively, per his report, multiple  negative heme stool hemoccults.  I did discuss with him today that if  his anemia persists, we really ought to consider a colonoscopy for iron  deficiency anemia, plus or minus or chronic intermittent diarrhea  depending on if those symptoms settle down.  If colonoscopy is pursued,  we would offer EGD at the same time for chronic gastroesophageal reflux  disease to exclude complications such as Barrett esophagus.  For now, he  would like to postpone any type of endoscopic procedures.  We would like  to see how he does on [name] [name] again.   PLAN:  1. [name] [name] 20 mg daily, #90 with 3 refills given.  2. He will come back in 6-8 weeks to see Dr. Jena [name].  Based on how he      is doing then, may consider colonoscopy, plus or minus EGD      predominantly for chronic GERD, iron deficiency anemia and to      reevaluate whether or not his diarrhea has resolved with change in      PPI therapy.   I would like to thank Dr. Alanda [name] for allowing [place] to take part in the  care of this patient.      [name] [name], P.A.  Electronically Signed     R. [name] [name], M.D.  Electronically Signed    [name] [name]  D:  03/14/2008  T:  03/15/2008  Job:  [id number]   cc:   [name] [name], M.D.  [name] [name], M.D.

## 2010-11-13 NOTE — Assessment & Plan Note (Signed)
OFFICE VISIT   ARPAN, ESKELSON B  DOB:  08-Apr-1961                                        May 12, 2007  CHART #:  [id number]   The patient returned today for followup status post coronary artery  bypass graft surgery x4 on April 22, 2007. He had post operative  atrial fibrillation and was sent home on amiodarone. He has been feeling  fairly well overall and is walking daily without chest pain or shortness  of breath. His main complaint is that he has had persistent nausea since  discharge and has not been eating very well. He saw Dr. [name]  yesterday and his amiodarone was decreased to 300 mg per day.   PHYSICAL EXAMINATION:  His blood pressure is 133/90 and his pulse is 84  and regular, respiratory rate is 18 and unlabored. Oxygen saturation on  room air is 98%. He looks well. CARDIAC: Shows a regular rate and rhythm  with normal heart sounds. His lung examination is clear. The chest  incision is healing well and the sternum is stable. His leg incision is  healing well and there is no peripheral edema.   Followup chest x-ray shows minimal left basilar atelectasis.   IMPRESSION:  Overall, the patient is recovering well from his surgery.  His only real complaint is of nausea, which is likely related to the  amiodarone. If this is not resolved over the next week on a lower dose  of amiodarone, then I would recommend discontinuing that altogether  since he appears to be maintaining sinus rhythm and is far enough out  from surgery that the likelihood of recurrent atrial fibrillation is  low. I encouraged him to continue walking as much as possible. I asked  him not to lift anything heavier than 10 pounds for a total of three  months from the date of surgery. I told him he can return to driving a  car. He will continue to followup with Dr.  [name] and his primary physician Dr. John Giovanni. He will return  to see me if he develops any problems  with his incisions.   Evelene Croon, M.D.  Electronically Signed   BB/MEDQ  D:  05/12/2007  T:  05/12/2007  Job:  [id number]   cc:   Gerlene Burdock A. [name], M.D.  Mila Homer. Sudie Bailey, M.D.

## 2010-11-16 NOTE — Cardiovascular Report (Signed)
[name], [name]                 ACCOUNT NO.:  [id number]   MEDICAL RECORD NO.:  [id number]          PATIENT TYPE:  INP   LOCATION:  2036                         FACILITY:  [facility]   PHYSICIAN:  Richard A. [name], M.D.DATE OF BIRTH:  [birth year]   DATE OF PROCEDURE:  10/25/2005  DATE OF DISCHARGE:                              CARDIAC CATHETERIZATION   PROCEDURE:  Retrograde central aortic catheterization, selective right  coronary angiography via Judkins technique, pre and postop nitroglycerin  administration, predilatation, percutaneous transluminal coronary  angiography, and subsequent DES 2.5/16 Taxus stent distal right coronary  artery (posterolateral artery) symptomatic stenosis, direct stenting  proximal right coronary artery stenosis 3/12 Taxus DES, double bolus  Aggrastat plus infusion, intracoronary nitroglycerin administration, weight  adjusted heparin 600,900 units in divided doses, preoperative Plavix 600 mg  give 4-6 hours prior to the procedure.   DESCRIPTION OF PROCEDURE:  The patient was brought to the second floor CP  lab.  Heparin drip was discontinued.  He had been premedicated with 600 mg  of Plavix p.o. prior to the procedure after catheterization in the  [facility].  Please see dictated report for full details.  The  previously placed 4-French Cordis short side-arm sheath was exchanged  sterilely for a 6-French Daig side-arm sheath.  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  The patient was hydrated preoperatively.  During the  procedure, he was given that Versed 3 mg IV in divided doses, 75 mcg of  fentanyl IV, and 2 mg of Nubain IV at the end of the procedure.  Previous  catheterization in the setting of new onset angina showed 95% segmental  stenosis of the PLA which arose before the acute  margin and a dominant right  coronary, 70% narrowing of the proximal third of the RCA, 40-50% at the  junction of the proximal third, and 40% beyond the acute marginal branch.  The previously placed DES 1535 from 1995 in the proximal LAD was widely  patent and he had a high-grade 85-90% stenosis beyond the second diagonal  and first septal perforator at the junction of the proximal and mid third of  the LAD.  Good LV function normal in the LAO projection with mid  anterolateral and distal inferior hypokinesis of the RAO projection.  EF  greater than 55%. Normal single renal arteries in the setting of  hypertension.   The patient enters diagnostic catheterization.  He was hydrated and given  600 mg Plavix p.o.  We elected to proceed with PCI in this setting in a  staged fashion for his culprit lesions.  Informed consent was obtained from  the patient and his wife to proceed.  The patient was given weight adjusted  heparin, ACTs were monitored, and he was sedated as outlined above.  The  right coronary was intubated with a JR-4 Cordis guiding catheter.  The  lesion was crossed with a 0.014 inch Asahi soft wire which was free in  the  distal PLA.  Initial attempts at direct stenting of the distal PLA with a  2.5 Taxus stent were unsuccessful because of the high-grade stenosis, so  this was removed and the lesion was predilated with a 2.25 by 12 Scimed  Maverick balloon at 07-15, 7-25 and 10-17.  This was then exchanged for  2.5/16 DES Taxus stent which was positioned across the stenosis and into the  distal bifurcation and AV node and deployed 12-35.  He was post dilated at  67-35.  The balloon was pulled back.  There was 70% stenosis just beyond the  guide catheter and there may have been a slight tear related to the guide  catheter, so we elected to go ahead and stent this area.  This was direct  stented with a 3/12 Taxus Express II stent positioned fluoroscopically and  deployed at 14-27  and post dilated 18-19 atmospheres.  The balloon was  pulled back.  Final injection showed the PLA stenosis reduced from 95 to 0%  and the proximal RCA 70% stenosis reduced from 70% to 0%.  There was no  dissection and good flow throughout the RCA.  There was residual 40%  narrowing at the junction of the proximal mid third beyond the proximal RCA  stent.  There was another 40% narrowing in the mid RCA beyond the RV branch.  There was excellent flow and no dissection.  Arterial pressures were  monitored throughout the procedure and blood pressure was 120-140 mmHg with  sinus rhythm.   The dilatation system was removed and diagnostic left coronary angiography  was done with two hand injections for better dye visualization with 6-French  JL-4 catheter.  This demonstrated high grade with 85% to 90% mildly  segmental stenosis of the LAD beyond the second diagonal and the first  septal perforator at the junction of the proximal mid third of the LAD.  The  previously placed DES 15/35 in the very proximal LAD before the DX1 was  widely patent and the circumflex had no significant disease.   The patient had successful RCA DES stenting for new onset unstable angina  seen yesterday in the office.  We have planned staged PCI of his LAD culprit  lesion.  Continue medical therapy including smoking cessation, ACE  inhibitors and ARB are being held now with good blood pressure control, he  was started on a beta-blocker, and will be continued on the Plavix.   CATHETERIZATION DIAGNOSIS:  1.  Arteriosclerotic heart disease - AWSEMI, age 25 premature coronary      disease with single vessel disease and ostial LAD bare metal stent      [id number] on Apalog protocol December 20, 1993, no long term re-stenosis.  2.  Unstable angina new onset.      1.  Successful high-grade PLA high-grade proximal RCA DES stenting as          outlined above.  3.  Mild segmental wall motion abnormalities, good overall EF greater  than     55% as outlined above.  4.  Planned staged LAD PCI.  5.  Systemic hypertension, normal renal arteries on medical therapy.  6.  AODM.  7.  Severe exogenous obesity.  8.  Hyperlipidemia on therapy.  9.  Continued cigarette abuse.      Richard A. [name], M.D.  Electronically Signed     RAW/MEDQ  D:  10/25/2005  T:  10/26/2005  Job:  [id number]   cc:   Mila Homer. Sudie Bailey, M.D.  Fax: [phone]

## 2010-11-16 NOTE — Cardiovascular Report (Signed)
NAMEWELLS, MABE                 ACCOUNT NO.:  [id number]   MEDICAL RECORD NO.:  [id number]          PATIENT TYPE:  INP   LOCATION:  6527                         FACILITY:  [facility]   PHYSICIAN:  Richard A. [name], M.D.DATE OF BIRTH:  06-May-1961   DATE OF PROCEDURE:  10/28/2005  DATE OF DISCHARGE:  10/29/2005                              CARDIAC CATHETERIZATION   PROCEDURE:  1.  Retrograde central aortic catheterization.  2.  Selective left coronary angiography pre and post intracoronary      nitroglycerin administration.  3.  IVUS interrogation left anterior descending using Atlantis Pro Scimed      IVUS.  4.  Weight-adjusted heparin.  5.  Continued Aggrastat infusion.  6.  Continued aspirin and Plavix.   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  He  has known coronary disease and remote acute coronary syndrome with a [facility]  and [id number] proximal LAD stent October 20, 1993 on epilog protocol.  No long  term re-stenosis.  He has been intolerant to beta blockers as an outpatient.  Continues to smoke.  Has hypertension, hyperlipidemia, exogenous obesity,  AODM.  He did well long-term with a negative Cardiolite just over two years  ago and presented with acute coronary syndrome, unstable angina of several  weeks' duration.  He has underwent outpatient catheterization at [facility], was found to have no restenosis of his proximal LAD, but high-  grade stenosis of the mid/distal right PLA (arising proximal to the acute  margin), proximal RCA and proximal/mid LAD, and borderline stenosis of the  proximal LAD.  He had minor wall motion abnormalities, good EF.  __transferred to [facility] where he underwent staged PCI on October 25, 2005 with 2B3A inhibitor, Plavix loading, continued aspirin.  He had PLA  stenting with a 2.516 TAXUS in the large PLA and required 3/12  TAXUS  stenting of the proximal bend of the RCA.  He tolerated this well.  Heparin  was discontinued.  He was kept on 2B3A over the weekend and he was brought  back for staged intervention of his LAD.  Informed consent was obtained from  the patient and his wife to proceed after all risks, benefits, and  alternatives were explained.   Patient was brought to the second floor CP laboratory in the post absorptive  state after 5 mg Valium p.o. pre medication, hydration preoperatively.  The  right groin had some mild ecchymosis but we used the left groin and LCFA was  entered with single anterior puncture using 18 thin-wall needle and a 6-  [place] short Daig sidearm sheath were inserted without difficulty.  The LAD  was intubated with a 6-French JL4 guiding catheter.  The LAD was crossed  into the distal vessel with a 0.014 inch Asahi soft wire after the patient  was given weight-adjusted heparin of 5000 units and ACT was therapeutic.  He  was given IC nitroglycerin 200 x3 during the procedure.  For sedation there  was 1%  Xylocaine, 5 mg of Versed IV, fentanyl 25 mcg IV, and Nubain 2 mg IV  in divided doses.  The LAD demonstrated calcific disease, a patent [id number]  stent just beyond the LAD ostia in the proximal third before DX1.  DX1 had  mild dilatation in its proximal third, but no significant stenosis when we  did very steep caudal shallow RAO views.  There was aneurysmal dilatation of  the LAD before DX1.  There was a 70-75% eccentric stenosis of the LAD  opposite the DX2 and between the DX2 and DX3 in the mid LAD was a 95%  stenosis concentric.  The initial lesion beyond the DX2 was crossed and  direct stented with a 3/13 CYPHER positioned fluoroscopically beyond the SP  branch deployed a 14/23, post dilated 14/21 and 16/14.  The balloon was  pulled back.  IVUS interrogation was then done with an Atlantis Pro IVUS.  This revealed good expansion of the mid LAD stent with some eccentric  mild  residual plaque.  There was 75% narrowing just the opposite to DX2.  The DX2  ostium was intact and widely patent.  The DX1 ostium was widely patent.  The  [id number] was widely patent with less than 30% narrowing except for a focal  area of approximately 50% or less in the mid stent.  This was fairly typical  intimal proliferation.   It was elected to proceed with tandem stenting of the proximal/mid LAD  lesion to cross with a DX2.  This was done with an overlapping 3/13 CYPHER  that was deployed at 14/21, post dilated at 16/20 and then the overlapped  dilated to 18/30.  The overlap was approximately one to two cells  angiographically.  He was very difficult to see because of his large size.  There was good TIMI 3 flow throughout the LAD and stenosis was reduced from  95% in the mid LAD to 0 and 75% in the proximal/mid LAD to 0 post DES  stenting.  There was TIMI 3 flow.  No dissection.  The patient tolerated the  procedure well.  He had chest pain with inflation promptly relieved with  balloon deflations   Left femoral angiogram showed good puncture into the LCFA.  The final ACT  was 224 seconds so we went ahead and used StarClose device to close the left  common femoral arteriotomy.  This was done successfully.   CATHETERIZATION DIAGNOSIS:  Please see full diagnoses on the catheterization  report/PCI report of October 25, 2005.   Successful staged mid left anterior descending and proximal/mid left  anterior descending tandem DES stenting with IVUS interrogation as outlined  above.      Richard A. [name], M.D.  Electronically Signed     RAW/MEDQ  D:  10/28/2005  T:  10/29/2005  Job:  [id number]   cc:   Mila Homer. Sudie Bailey, M.D.  Fax: [phone]

## 2010-11-16 NOTE — Discharge Summary (Signed)
NAMEDEQUAVIUS, KUHNER                 ACCOUNT NO.:  [id number]   MEDICAL RECORD NO.:  [id number]          PATIENT TYPE:  INP   LOCATION:  6527                         FACILITY:  [facility]   PHYSICIAN:  Richard A. [name], M.D.DATE OF BIRTH:  [birth year]   DATE OF ADMISSION:  10/25/2005  DATE OF DISCHARGE:  10/29/2005                                 DISCHARGE SUMMARY   DISCHARGE DIAGNOSES:  1.  Unstable angina, staged percutaneous coronary intervention to the right      coronary artery on October 25, 2005, and the left anterior descending      coronary artery on October 28, 2005.  2.  Prior left anterior descending coronary artery stent in 1995.  3.  Non-insulin-dependent diabetes.  4.  Treated dyslipidemia.  5.  History of smoking.  6.  Obesity.   HOSPITAL COURSE:  The patient is a 50 year old male followed by Dr.  [name] and Dr. Sudie Bailey who lives in [place].  He has a history of  coronary disease and had an LAD stent for unstable angina in June 1995.  He  had been treated medically.  He presented to Dr. [name] October 24, 2005,  and had been complaining of exertional chest pain worrisome for unstable  angina.  He was set up [facility]  This was October 25, 2005.  It revealed a 70% proximal RCA, a  95% distal RCA, a patent proximal RCA stent, and an 85% mid-LAD stenosis  after the second diagonal.  There was a 75% stenosis of the LAD at the  takeoff of the second diagonal.  Circumflex was without significant stenosis  and his EF was greater than 55%.  Renal arteries were normal.  Internal  mammary arteries were normal.  The patient was transferred to [facility], started  on IV nitrates and heparin, and set up for RCA intervention which was done  October 25, 2005, by Dr. [name].  Proximal and distal RCA stenoses were  opened with Taxus stents.  Plan was for a staged intervention to the LAD.  He tolerated the procedure well.  He was  kept on Aggrastat post procedure.  On Monday October 28, 2005, he underwent elective intervention to the LAD to  two sites.  Cypher stents were used.  He tolerated this procedure well.  We  feel he can be discharged Oct 29, 2005.   DISCHARGE MEDICATIONS:  1.  Coated aspirin once a day.  2.  Plavix 75 mg a day.  3.  Niacin 1 g h.s.  4.  Glyburide 5 mg twice a day.  5.  Avandia 8 mg a day.  6.  Glucophage 500 mg twice a day to start on Oct 31, 2005.  7.  Cozaar 100 mg a day.  8.  Lisinopril 20 mg a day.  9.  Metoprolol 25 mg twice a day.  10. Zantac 150 a day.  11. Lipitor 80 mg a day.  12. Nitroglycerin sublingual p.r.n.   LABORATORIES:  White count 10.7, hemoglobin 12.6, hematocrit 36.5, platelets  304.  Sodium 138,  potassium 3.9, BUN 11, creatinine 1.0, glucose 170.  Troponins and CKs are negative.  Hemoglobin A1c is 7.4.  Chest x-ray:  No  active disease.  INR 0.9.  EKG shows sinus rhythm with no acute changes.   DISPOSITION:  The patient is discharged in stable condition and will follow  up with Dr. [name] in [place]; he already has an appointment.      Abelino Derrick, P.A.      Richard A. [name], M.D.  Electronically Signed    LKK/MEDQ  D:  10/29/2005  T:  10/29/2005  Job:  [id number]   cc:   Mila Homer. Sudie Bailey, M.D.  Fax: [phone]

## 2011-04-10 LAB — CROSSMATCH: Antibody Screen: NEGATIVE

## 2011-04-10 LAB — URINE MICROSCOPIC-ADD ON

## 2011-04-10 LAB — CBC
HCT: 21 — ABNORMAL LOW
HCT: 23.2 — ABNORMAL LOW
HCT: 34.4 — ABNORMAL LOW
HCT: 35.8 — ABNORMAL LOW
HCT: 37 — ABNORMAL LOW
Hemoglobin: 12 — ABNORMAL LOW
Hemoglobin: 12.2 — ABNORMAL LOW
Hemoglobin: 12.8 — ABNORMAL LOW
Hemoglobin: 13.1
Hemoglobin: 7.3 — CL
Hemoglobin: 8 — ABNORMAL LOW
MCHC: 33.9
MCHC: 34
MCHC: 34.4
MCHC: 34.5
MCHC: 34.5
MCHC: 34.5
MCHC: 34.9
MCV: 87.6
MCV: 88.4
MCV: 88.7
MCV: 88.7
MCV: 89.4
MCV: 90
Platelets: 246
Platelets: 266
Platelets: 333
RBC: 2.35 — ABNORMAL LOW
RBC: 2.67 — ABNORMAL LOW
RBC: 2.74 — ABNORMAL LOW
RBC: 2.85 — ABNORMAL LOW
RBC: 3.91 — ABNORMAL LOW
RBC: 4.09 — ABNORMAL LOW
RBC: 4.21 — ABNORMAL LOW
RBC: 4.24
RDW: 12.6
RDW: 12.7
RDW: 12.9
RDW: 12.9
RDW: 13.3
RDW: 13.7
WBC: 10.6 — ABNORMAL HIGH
WBC: 11.3 — ABNORMAL HIGH
WBC: 13.4 — ABNORMAL HIGH
WBC: 13.6 — ABNORMAL HIGH
WBC: 17.5 — ABNORMAL HIGH
WBC: 9.1
WBC: 9.6
WBC: 9.7

## 2011-04-10 LAB — HEMOGLOBIN A1C
Hgb A1c MFr Bld: 8.4 — ABNORMAL HIGH
Mean Plasma Glucose: 222

## 2011-04-10 LAB — BASIC METABOLIC PANEL
BUN: 14
BUN: 20
CO2: 25
CO2: 28
CO2: 28
Calcium: 8 — ABNORMAL LOW
Calcium: 8.3 — ABNORMAL LOW
Calcium: 8.4
Chloride: 103
Chloride: 99
Creatinine, Ser: 0.9
Creatinine, Ser: 0.92
Creatinine, Ser: 0.98
GFR calc Af Amer: >60
GFR calc Af Amer: >60
GFR calc Af Amer: >60
GFR calc Af Amer: >60
GFR calc non Af Amer: >60
Glucose, Bld: 142 — ABNORMAL HIGH
Glucose, Bld: 165 — ABNORMAL HIGH
Glucose, Bld: 180 — ABNORMAL HIGH
Potassium: 4.7
Sodium: 136
Sodium: 138

## 2011-04-10 LAB — LIPID PANEL
HDL: 21 — ABNORMAL LOW
Total CHOL/HDL Ratio: 5.6

## 2011-04-10 LAB — I-STAT EC8
Acid-base deficit: 3 — ABNORMAL HIGH
Bicarbonate: 23.3
Chloride: 105
HCT: 24 — ABNORMAL LOW
Operator id: 277261
pCO2 arterial: 45.5 — ABNORMAL HIGH

## 2011-04-10 LAB — POCT I-STAT 3, ART BLOOD GAS (G3+)
Bicarbonate: 23.6
Bicarbonate: 24
Bicarbonate: 24.9 — ABNORMAL HIGH
Bicarbonate: 25.2 — ABNORMAL HIGH
O2 Saturation: 100
O2 Saturation: 98
Operator id: 3402
TCO2: 25
TCO2: 25
pCO2 arterial: 40.5
pCO2 arterial: 41.9
pCO2 arterial: 43.4
pH, Arterial: 7.351
pO2, Arterial: 195 — ABNORMAL HIGH
pO2, Arterial: 283 — ABNORMAL HIGH
pO2, Arterial: 83
pO2, Arterial: 98

## 2011-04-10 LAB — POCT I-STAT 4, (NA,K, GLUC, HGB,HCT)
Glucose, Bld: 142 — ABNORMAL HIGH
Glucose, Bld: 157 — ABNORMAL HIGH
HCT: 24 — ABNORMAL LOW
HCT: 38 — ABNORMAL LOW
Hemoglobin: 12.9 — ABNORMAL LOW
Hemoglobin: 8.2 — ABNORMAL LOW
Hemoglobin: 8.8 — ABNORMAL LOW
Operator id: 3402
Potassium: 4.3
Potassium: 4.5
Sodium: 135
Sodium: 136

## 2011-04-10 LAB — URINALYSIS, ROUTINE W REFLEX MICROSCOPIC
Bilirubin Urine: NEGATIVE
Glucose, UA: >1000 — AB
Ketones, ur: NEGATIVE
Leukocytes, UA: NEGATIVE
Nitrite: NEGATIVE
Nitrite: NEGATIVE
Protein, ur: NEGATIVE
Protein, ur: NEGATIVE
pH: 5.5
pH: 7

## 2011-04-10 LAB — URINE CULTURE: Culture: NO GROWTH

## 2011-04-10 LAB — MAGNESIUM
Magnesium: 2.3
Magnesium: 2.5

## 2011-04-10 LAB — ABO/RH: ABO/RH(D): A POS

## 2011-04-10 LAB — HEMOGLOBIN AND HEMATOCRIT, BLOOD
HCT: 26.8 — ABNORMAL LOW
Hemoglobin: 9.1 — ABNORMAL LOW

## 2011-04-10 LAB — COMPREHENSIVE METABOLIC PANEL
CO2: 28
Calcium: 9.5
Chloride: 103
Creatinine, Ser: 0.86
GFR calc Af Amer: >60
Glucose, Bld: 236 — ABNORMAL HIGH
Potassium: 4.2

## 2011-04-10 LAB — BLOOD GAS, ARTERIAL
Acid-Base Excess: 0.3
TCO2: 25.5
pCO2 arterial: 38.6

## 2011-04-10 LAB — PREPARE PLATELET PHERESIS

## 2011-04-10 LAB — TYPE AND SCREEN
ABO/RH(D): A POS
Antibody Screen: NEGATIVE

## 2011-04-10 LAB — C-REACTIVE PROTEIN: CRP: 0.1 — ABNORMAL LOW (ref <0.6)

## 2011-04-10 LAB — CREATININE, SERUM: GFR calc Af Amer: >60

## 2014-02-10 ENCOUNTER — Encounter: Payer: Self-pay | Admitting: Cardiovascular Disease

## 2014-02-10 ENCOUNTER — Telehealth: Payer: Self-pay | Admitting: Cardiovascular Disease

## 2014-02-11 NOTE — Telephone Encounter (Signed)
Closed encounter °

## 2014-02-28 ENCOUNTER — Telehealth: Payer: Self-pay | Admitting: Cardiovascular Disease

## 2014-02-28 ENCOUNTER — Other Ambulatory Visit (HOSPITAL_COMMUNITY): Payer: Self-pay | Admitting: Internal Medicine

## 2014-02-28 ENCOUNTER — Ambulatory Visit (HOSPITAL_COMMUNITY)
Admission: RE | Admit: 2014-02-28 | Discharge: 2014-02-28 | Disposition: A | Discharge status: Home / Self Care | Payer: Federal, State, Local not specified - PPO | Source: Ambulatory Visit | Attending: Internal Medicine | Admitting: Internal Medicine

## 2014-02-28 DIAGNOSIS — N5089 Other specified disorders of the male genital organs: Secondary | ICD-10-CM

## 2014-02-28 DIAGNOSIS — N508 Other specified disorders of male genital organs: Secondary | ICD-10-CM | POA: Diagnosis present

## 2014-02-28 DIAGNOSIS — I861 Scrotal varices: Secondary | ICD-10-CM | POA: Insufficient documentation

## 2014-02-28 NOTE — Telephone Encounter (Signed)
Pt's wife was returning Cheryl's phone call. Please call  thanks

## 2014-02-28 NOTE — Telephone Encounter (Signed)
Returned call to patient's wife.She stated husband has been having chest pain off and on for 1 week.Stated no chest pain at present.Stated husband is a former patient of Dr.Weintraub and has not been seen in a long time over 4 years.Stated he has appointment with Dr.Croitoru 03/28/14.Stated would like to be seen sooner.Dr.Croitoru's schedule is full.Will send message to his nurse Britta Mccreedy for a sooner appointment.Advised to go to ER if needed.

## 2014-02-28 NOTE — Telephone Encounter (Signed)
[name] is calling because [name] is having chest pains off and on for 2-3 days which is not constant  and shortness of breath and feeling bad . Has an appointment  On 09/28 and wants to know should he comes in earlier.. Please call    Thanks

## 2014-03-01 NOTE — Telephone Encounter (Signed)
Message sent to [facility] to schedule sooner if an opening comes available..  This will be a new patient.  Per Elnita Maxwell advised to go to ER if he has any chest pain.

## 2014-03-02 ENCOUNTER — Telehealth: Payer: Self-pay | Admitting: Cardiovascular Disease

## 2014-03-03 NOTE — Telephone Encounter (Signed)
Closed encounter °

## 2014-03-28 ENCOUNTER — Ambulatory Visit (INDEPENDENT_AMBULATORY_CARE_PROVIDER_SITE_OTHER): Payer: Federal, State, Local not specified - PPO | Admitting: Cardiovascular Disease

## 2014-03-28 ENCOUNTER — Encounter: Payer: Self-pay | Admitting: Cardiovascular Disease

## 2014-03-28 VITALS — BP 132/86 | HR 79 | Resp 16 | Ht 73.0 in | Wt 263.7 lb

## 2014-03-28 DIAGNOSIS — E669 Obesity, unspecified: Secondary | ICD-10-CM

## 2014-03-28 DIAGNOSIS — I4891 Unspecified atrial fibrillation: Secondary | ICD-10-CM

## 2014-03-28 DIAGNOSIS — I798 Other disorders of arteries, arterioles and capillaries in diseases classified elsewhere: Secondary | ICD-10-CM

## 2014-03-28 DIAGNOSIS — I2510 Atherosclerotic heart disease of native coronary artery without angina pectoris: Secondary | ICD-10-CM

## 2014-03-28 DIAGNOSIS — E782 Mixed hyperlipidemia: Secondary | ICD-10-CM

## 2014-03-28 DIAGNOSIS — I10 Essential (primary) hypertension: Secondary | ICD-10-CM

## 2014-03-28 DIAGNOSIS — I4819 Other persistent atrial fibrillation: Secondary | ICD-10-CM

## 2014-03-28 DIAGNOSIS — E1159 Type 2 diabetes mellitus with other circulatory complications: Secondary | ICD-10-CM

## 2014-03-28 MED ORDER — DRONEDARONE HCL 400 MG PO TABS: 400.0000 mg | ORAL_TABLET | Freq: Two times a day (BID) | ORAL | Status: DC

## 2014-03-28 NOTE — Patient Instructions (Signed)
A referral has been placed to Dr. Hillis Range to discuss cardiac ablation.  START Multaq  twice a day.  Once you start the Xarelto Dr. Sudie Bailey has prescribed STOP the Aspirin.  Your physician has requested that you have an echocardiogram. Echocardiography is a painless test that uses sound waves to create images of your heart. It provides your doctor with information about the size and shape of your heart and how well your heart's chambers and valves are working. This procedure takes approximately one hour. There are no restrictions for this procedure.  Dr. Royann Shivers recommends that you schedule a follow-up appointment in: One Month.

## 2014-03-31 ENCOUNTER — Encounter: Payer: Self-pay | Admitting: Cardiovascular Disease

## 2014-03-31 DIAGNOSIS — E669 Obesity, unspecified: Secondary | ICD-10-CM | POA: Insufficient documentation

## 2014-03-31 DIAGNOSIS — I4819 Other persistent atrial fibrillation: Secondary | ICD-10-CM | POA: Insufficient documentation

## 2014-03-31 DIAGNOSIS — E785 Hyperlipidemia, unspecified: Secondary | ICD-10-CM | POA: Insufficient documentation

## 2014-03-31 DIAGNOSIS — Z951 Presence of aortocoronary bypass graft: Secondary | ICD-10-CM | POA: Insufficient documentation

## 2014-03-31 DIAGNOSIS — E119 Type 2 diabetes mellitus without complications: Secondary | ICD-10-CM | POA: Insufficient documentation

## 2014-03-31 DIAGNOSIS — I10 Essential (primary) hypertension: Secondary | ICD-10-CM | POA: Insufficient documentation

## 2014-03-31 NOTE — Assessment & Plan Note (Addendum)
This is well rate controlled and appears to be most moderately symptomatic.  Recurrent episode's duration is uncertain. Cardioversion does not appear to be immediately necessary and may not be necessary as part of long-term management either. Discussed the fact that there are limited antiarrhythmic options for a patient with coronary disease. He has no interest in being hospitalized to start sotalol or Tikosyn. The side effects of amiodarone are probably not justified in a patient that is not symptomatic. We'll check an echocardiogram to assess the size of his left atrium and update evaluation of left ventricular systolic function. Stop aspirin when he starts theXarelto.  It is very important that he start taking anticoagulation. We discussed the risks of embolic events including major stroke at length. I think he now understands how critical this treatment is and agrees to proceed. Can try outpatient multaq 400 mg twice a day taken with meals. He needs to start the anticoagulant first. He is most interested in undergoing a more permanent solution for the arrhythmia and we'll refer him to discuss radiofrequency ablation with Dr. Hillis RangeJames Allred.

## 2014-03-31 NOTE — Progress Notes (Signed)
Patient ID: [name], male   DOB: [birth year], 53 y.o.   MRN: [id number]     Reason for office visit Atrial fibrillation, CAD  [name] was a patient of Dr. Terance Ice for many years but has not been seen by cardiology since 2009. He has a history of premature coronary artery disease.   He first presented with coronary disease at age 55 (bare-metal [id number] stent to proximal LAD in the setting of acute myocardial infarction) and had recurrent problems in 2007 (right coronary artery drug-eluting Taxus stents to the proximal RCA 3.0x12 and the mid PLA 2.5x16, followed by same admission placement of 2 drug-eluting Cypher 3.0x13 stents to the mid LAD). Bypass surgery was performed after repeat cardiac catheterization October 2008 showed 50% proximal LAD stent restenosis followed by aneurysmal dilatation and an 85% stenosis of the LAD involving the first diagonal branch as well as moderate lesions in the left circumflex and right coronary artery. He  underwent four-vessel bypass surgery in 2008. (Dr. Cyndia Bent, LIMA to LAD, SVG to first diagonal, SVG to first OM, SVG to PDA).  His most recent functional study was an echocardiogram in 2009 that showed normal overall left ventricular systolic function with mild anteroseptal hypokinesis and a mild to moderately dilated left atrium. He has not had a nuclear stress test since his bypass procedure.  He describes classical angina pectoris and shortness of breath before his coronary events. He describes as a substernal pressure and burning. It is different from his reflux pain. Angina has not recurred since his bypass  He did have postoperative paroxysmal atrial fibrillation but no atrial fibrillation was recorded until he recently saw Dr. Karie Kirks. The arrhythmia was recorded incidentally during a routine exam. He is aware of the palpitations and thinks that he does a little worse when he is in the arrhythmia. He thinks he has less energy and is a little  more dyspneic. He thinks it has happened many times over the last few years. His rhythm remains irregular today. Rate control is excellent, due to chronic treatment with beta blockers.  Dr. Karie Kirks prescribed Xarelto but the patient has not started it yet. He is reluctant because of the "bad commercials" on television. He does not have a history of stroke or TIA or other embolic events and does not have a known bleeding tendency or abnormal bleeding.  Additional problems include type 2 diabetes mellitus, obesity, hypertension and gastroesophageal reflux disease. He has mixed hyperlipidemia. He is a former smoker. He may have a history of asbestos exposure when he worked in the [employer]. 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  Denies exertional dyspnea but when I looked through his primary care physician's notes, [name] has described episodes of up to 8 pound weight gain within a couple of days it is associated with dyspnea and resolves if he "sweats a lot" by working.   No Known Allergies  Current Outpatient Prescriptions  Medication Sig Dispense Refill  . ACCU-CHEK AVIVA PLUS test strip       . aspirin EC 81 MG tablet Take 81 mg by mouth daily.      Marland Kitchen atorvastatin (LIPITOR) 40 MG tablet       . BYSTOLIC 10 MG tablet       . ferrous sulfate 325 (65 FE) MG tablet Take 325 mg by mouth 2 (two) times daily.      . furosemide (LASIX) 40  MG tablet       . JANUVIA 100 MG tablet       . Lancets (ACCU-CHEK MULTICLIX) lancets       . LORazepam (ATIVAN) 0.5 MG tablet       . losartan (COZAAR) 50 MG tablet       . metFORMIN (GLUCOPHAGE) 1000 MG tablet       . Multiple Vitamins-Minerals (MULTIVITAMIN WITH MINERALS) tablet Take 1 tablet by mouth daily.      . naproxen (NAPROSYN) 500 MG tablet       . potassium chloride SA (K-DUR,KLOR-CON) 20 MEQ tablet Take 20 mEq by mouth daily.      . ranitidine (ZANTAC) 75 MG tablet Take 75 mg by  mouth daily.      Marland Kitchen dronedarone (MULTAQ) 400 MG tablet Take 1 tablet (400 mg total) by mouth 2 (two) times daily with a meal.  60 tablet  6   No current facility-administered medications for this visit.    Past Medical History  Diagnosis Date  . Heart disease   . Hypertension   . Diabetes   . High cholesterol     Past Surgical History  Procedure Laterality Date  . Coronary angioplasty    . Coronary artery bypass graft      History reviewed. No pertinent family history.  History   Social History  . Marital Status: Married    Spouse Name: N/A    Number of Children: N/A  . Years of Education: N/A   Occupational History  . Not on file.   Social History Main Topics  . Smoking status: Never Smoker   . Smokeless tobacco: Not on file  . Alcohol Use: 0.5 oz/week    1 drink(s) per week  . Drug Use: No  . Sexual Activity: Not on file   Other Topics Concern  . Not on file   Social History Narrative  . No narrative on file    Review of systems: The patient specifically denies any chest pain at rest or with exertion, dyspnea at rest or with exertion, orthopnea, paroxysmal nocturnal dyspnea, syncope, palpitations, focal neurological deficits, intermittent claudication, lower extremity edema,  cough, hemoptysis or wheezing.  The patient also denies abdominal pain, nausea, vomiting, dysphagia, diarrhea, constipation, polyuria, polydipsia, dysuria, hematuria, frequency, urgency, abnormal bleeding or bruising, fever, chills, unexpected weight changes, mood swings, change in skin or hair texture, change in voice quality, auditory or visual problems, allergic reactions or rashes, new musculoskeletal complaints other than usual "aches and pains".   PHYSICAL EXAM BP 132/86  Pulse 79  Resp 16  Ht $R'6\' 1"'fN$  (1.854 m)  Wt 119.614 kg (263 lb 11.2 oz)  BMI 34.80 kg/m2  General: Alert, oriented x3, no distress Head: no evidence of trauma, PERRL, EOMI, no exophtalmos or lid lag, no  myxedema, no xanthelasma; normal ears, nose and oropharynx Neck: normal jugular venous pulsations and no hepatojugular reflux; brisk carotid pulses without delay and no carotid bruits Chest: clear to auscultation, no signs of consolidation by percussion or palpation, normal fremitus, symmetrical and full respiratory excursions Cardiovascular: normal position and quality of the apical impulse, irregular rhythm, normal first and second heart sounds, no murmurs, rubs or gallops Abdomen: no tenderness or distention, no masses by palpation, no abnormal pulsatility or arterial bruits, normal bowel sounds, no hepatosplenomegaly Extremities: no clubbing, cyanosis or edema; 2+ radial, ulnar and brachial pulses bilaterally; 2+ right femoral, posterior tibial and dorsalis pedis pulses; 2+ left femoral, posterior tibial and dorsalis pedis  pulses; no subclavian or femoral bruits Neurological: grossly nonfocal   EKG: Atrial fibrillation, delayed R-wave progression, otherwise normal  Lipid Panel     Component Value Date/Time   CHOL  Value: 118        ATP III CLASSIFICATION:  <200     mg/dL   Desirable  200-239  mg/dL   Borderline High  >=240    mg/dL   High 04/19/2007 0326   TRIG 169* 04/19/2007 0326   HDL 21* 04/19/2007 0326   CHOLHDL 5.6 04/19/2007 0326   VLDL 34 04/19/2007 0326   LDLCALC  Value: 63        Total Cholesterol/HDL:CHD Risk Coronary Heart Disease Risk Table                     Men   Women  1/2 Average Risk   3.4   3.3 04/19/2007 0326    BMET    Component Value Date/Time   NA 135 04/25/2007 0500   K 4.5 04/25/2007 0500   CL 99 04/25/2007 0500   CO2 28 04/25/2007 0500   GLUCOSE 165* 04/25/2007 0500   BUN 20 04/25/2007 0500   CREATININE 0.92 04/25/2007 0500   CALCIUM 8.4 04/25/2007 0500   GFRNONAA >60 04/25/2007 0500   GFRAA  Value: >60        The eGFR has been calculated using the MDRD equation. This calculation has not been validated in all clinical 04/25/2007 0500     ASSESSMENT  AND PLAN Persistent atrial fibrillation This is well rate controlled and appears to be most moderately symptomatic.  Recurrent episode's duration is uncertain. Cardioversion does not appear to be immediately necessary and may not be necessary as part of long-term management either. Discussed the fact that there are limited antiarrhythmic options for a patient with coronary disease. He has no interest in being hospitalized to start sotalol or Tikosyn. The side effects of amiodarone are probably not justified in a patient that is not symptomatic. We'll check an echocardiogram to assess the size of his left atrium and update evaluation of left ventricular systolic function. Stop aspirin when he starts theXarelto.  It is very important that he start taking anticoagulation. We discussed the risks of embolic events including major stroke at length. I think he now understands how critical this treatment is and agrees to proceed. Can try outpatient multaq 400 mg twice a day taken with meals. He needs to start the anticoagulant first. He is most interested in undergoing a more permanent solution for the arrhythmia and we'll refer him to discuss radiofrequency ablation with Dr. Thompson Grayer.  Mixed hyperlipidemia I don't have the actual results, but it appears that he was switched from Crestor to atorvastatin for financial reasons not long ago. Will need a repeat lipid profile. Diabetes control is mediocre, hemoglobin A1c of 7.7%.  CAD (coronary artery disease) He had typical exertional angina before all of his previous coronary events. He has no anginal complaints at this time. He does have shortness of breath which may be an expression of diastolic dysfunction/systolic dysfunction or even a manifestation of his atrial fibrillation. We'll have to review after his echocardiogram.   Orders Placed This Encounter  Procedures  . Ambulatory referral to Cardiac Electrophysiology  . EKG 12-Lead  . 2D  Echocardiogram without contrast   Meds ordered this encounter  Medications  . atorvastatin (LIPITOR) 40 MG tablet    Sig:   . furosemide (LASIX) 40 MG tablet  Sig:   . LORazepam (ATIVAN) 0.5 MG tablet    Sig:   . losartan (COZAAR) 50 MG tablet    Sig:   . metFORMIN (GLUCOPHAGE) 1000 MG tablet    Sig:   . naproxen (NAPROSYN) 500 MG tablet    Sig:   . BYSTOLIC 10 MG tablet    Sig:   . JANUVIA 100 MG tablet    Sig:   . ACCU-CHEK AVIVA PLUS test strip    Sig:   . Lancets (ACCU-CHEK MULTICLIX) lancets    Sig:   . Multiple Vitamins-Minerals (MULTIVITAMIN WITH MINERALS) tablet    Sig: Take 1 tablet by mouth daily.  Marland Kitchen aspirin EC 81 MG tablet    Sig: Take 81 mg by mouth daily.  . ranitidine (ZANTAC) 75 MG tablet    Sig: Take 75 mg by mouth daily.  . potassium chloride SA (K-DUR,KLOR-CON) 20 MEQ tablet    Sig: Take 20 mEq by mouth daily.  . ferrous sulfate 325 (65 FE) MG tablet    Sig: Take 325 mg by mouth 2 (two) times daily.  Marland Kitchen dronedarone (MULTAQ) 400 MG tablet    Sig: Take 1 tablet (400 mg total) by mouth 2 (two) times daily with a meal.    Dispense:  60 tablet    Refill:  Jackson Renlee Floor, MD, [place] [phone] office [phone] pager

## 2014-03-31 NOTE — Assessment & Plan Note (Signed)
He had typical exertional angina before all of his previous coronary events. He has no anginal complaints at this time. He does have shortness of breath which may be an expression of diastolic dysfunction/systolic dysfunction or even a manifestation of his atrial fibrillation. We'll have to review after his echocardiogram.

## 2014-03-31 NOTE — Assessment & Plan Note (Signed)
I don't have the actual results, but it appears that he was switched from Crestor to atorvastatin for financial reasons not long ago. Will need a repeat lipid profile. Diabetes control is mediocre, hemoglobin A1c of 7.7%.

## 2014-04-04 ENCOUNTER — Ambulatory Visit (HOSPITAL_COMMUNITY)
Admission: RE | Admit: 2014-04-04 | Discharge: 2014-04-04 | Disposition: A | Discharge status: Home / Self Care | Payer: Federal, State, Local not specified - PPO | Source: Ambulatory Visit | Attending: Cardiology | Admitting: Cardiology

## 2014-04-04 DIAGNOSIS — I340 Nonrheumatic mitral (valve) insufficiency: Secondary | ICD-10-CM | POA: Insufficient documentation

## 2014-04-04 DIAGNOSIS — I059 Rheumatic mitral valve disease, unspecified: Secondary | ICD-10-CM

## 2014-04-04 DIAGNOSIS — I4891 Unspecified atrial fibrillation: Secondary | ICD-10-CM | POA: Insufficient documentation

## 2014-04-04 NOTE — Progress Notes (Signed)
2D Echo Performed 04/04/2014    Kasen Sako, RCS  

## 2014-04-05 ENCOUNTER — Telehealth: Payer: Self-pay | Admitting: Cardiovascular Disease

## 2014-04-05 NOTE — Telephone Encounter (Signed)
Returning your call. °

## 2014-04-05 NOTE — Telephone Encounter (Signed)
Echo results discussed with patient.  Voiced understanding.

## 2014-04-20 ENCOUNTER — Institutional Professional Consult (permissible substitution): Payer: Federal, State, Local not specified - PPO | Admitting: Internal Medicine

## 2014-04-25 ENCOUNTER — Ambulatory Visit (INDEPENDENT_AMBULATORY_CARE_PROVIDER_SITE_OTHER): Payer: Federal, State, Local not specified - PPO | Admitting: Internal Medicine

## 2014-04-25 ENCOUNTER — Encounter: Payer: Self-pay | Admitting: Internal Medicine

## 2014-04-25 VITALS — BP 124/76 | HR 71 | Ht 72.0 in | Wt 262.6 lb

## 2014-04-25 DIAGNOSIS — I10 Essential (primary) hypertension: Secondary | ICD-10-CM

## 2014-04-25 DIAGNOSIS — I481 Persistent atrial fibrillation: Secondary | ICD-10-CM

## 2014-04-25 DIAGNOSIS — E669 Obesity, unspecified: Secondary | ICD-10-CM

## 2014-04-25 DIAGNOSIS — I4819 Other persistent atrial fibrillation: Secondary | ICD-10-CM

## 2014-04-25 NOTE — Patient Instructions (Signed)
Your physician has recommended you make the following change in your medication:  1) Stop Multaq   Audrie LiaSally Earl, Pharm D will call you about going to hospital for Tikosyn load

## 2014-04-25 NOTE — Progress Notes (Signed)
Primary Care Physician: Milana ObeyKNOWLTON,STEPHEN D, MD Primary Cardiologist:Dr. Croitoru Primary Electrophysiologist:Dr. Naiyah Klostermann Referring Physician:Dr. Croitoru   [name] OttoOtha B Yetta [name] is a 53 y.o. male with a h/o persitent atrial fibrillation who [facility].  The patient was initially diagnosed with atrial fibrillation in 2008 post op from  Bypass surgery. He took amiodarone for a short time after surgery but he returned to SR. He has noticed that over the years he would has spells of his heart racing that would be brief. Several weeks ago he started to feel poorly and was found to be in afib. after presenting with symptoms of  Fatigue. He was started on xarelto and multaq and feels much better but presents in rate controlled afib today.  Today, he denies symptoms of palpitations, chest pain, shortness of breath, orthopnea, PND, lower extremity edema, dizziness, presyncope, syncope, snoring, daytime somnolence, bleeding, or neurologic sequela. The patient is tolerating medications without difficulties and is otherwise without complaint today.    Atrial Fibrillation Risk Factors:  he does not have symptoms or diagnosis of sleep apnea.  he does not have a history of rheumatic fever.  he does not have a history of alcohol use.  he has a BMI of Body mass index is 35.61 kg/(m^2).Marland Kitchen. Filed Weights   04/25/14 0902  Weight: 262 lb 9.6 oz (119.115 kg)    LA size: 33.5 mm   Atrial Fibrillation Management history:  Previous antiarrhythmic drugs: multaq, remoter use of amiodrone s/p bypass surgery.  Previous cardioversions: none  Previous ablations: noe  CHADS2VASC score: at least 3  Anticoagulation history: xarelto   Past Medical History  Diagnosis Date  . Heart disease   . Hypertension   . Diabetes   . High cholesterol   . Dysrhythmia   . Myocardial infarction   . Coronary artery disease    Past Surgical History  Procedure  Laterality Date  . Coronary angioplasty    . Coronary artery bypass graft      Current Outpatient Prescriptions  Medication Sig Dispense Refill  . ACCU-CHEK AVIVA PLUS test strip       . atorvastatin (LIPITOR) 40 MG tablet Take 40 mg by mouth daily at 6 PM. Generic only      . BYSTOLIC 10 MG tablet Take 10 mg by mouth every morning.       . dronedarone (MULTAQ) 400 MG tablet Take 1 tablet (400 mg total) by mouth 2 (two) times daily with a meal.  60 tablet  6  . ferrous sulfate 325 (65 FE) MG tablet Take 325 mg by mouth 2 (two) times daily.      . furosemide (LASIX) 40 MG tablet Take 1-2 tablets by mouth daily as needed for fluid      . JANUVIA 100 MG tablet Take 100 mg by mouth daily.       . Lancets (ACCU-CHEK MULTICLIX) lancets       . LORazepam (ATIVAN) 0.5 MG tablet Take 0.5 mg by mouth at bedtime.       Marland Kitchen. losartan (COZAAR) 50 MG tablet Take 50 mg by mouth daily.       . metFORMIN (GLUCOPHAGE) 1000 MG tablet Take 1,000 mg by mouth 2 (two) times daily with a meal.       . Multiple Vitamins-Minerals (MULTIVITAMIN WITH MINERALS) tablet Take 1 tablet by mouth daily.      [name]. Omeprazole-Sodium Bicarbonate (ZEGERID OTC PO) Take 1 capsule by mouth daily.      .Marland Kitchen  potassium chloride SA (K-DUR,KLOR-CON) 20 MEQ tablet Take 20 mEq by mouth daily.      [name]. XARELTO 20 MG TABS tablet Take 1 tablet by mouth daily.       No current facility-administered medications for this visit.    No Known Allergies  History   Social History  . Marital Status: Married    Spouse Name: N/A    Number of Children: N/A  . Years of Education: N/A   Occupational History  . Not on file.   Social History Main Topics  . Smoking status: Never Smoker   . Smokeless tobacco: Not on file  . Alcohol Use: 0.5 oz/week    1 drink(s) per week  . Drug Use: No  . Sexual Activity: Not on file   Other Topics Concern  . Not on file   Social History Narrative  . No narrative on file    No family history on file. The  patient does not have a history of early familial atrial fibrillation or other arrhythmias.  ROS- All systems are reviewed and negative except as per the HPI above.  Physical Exam: Filed Vitals:   04/25/14 0902  BP: 124/76  Pulse: 71  Height: 6' (1.829 m)  Weight: 262 lb 9.6 oz (119.115 kg)    GEN- The patient is well appearing, alert and oriented x 3 today.   Head- normocephalic, atraumatic Eyes-  Sclera clear, conjunctiva pink Ears- hearing intact Oropharynx- clear Neck- supple, no JVP Lymph- no cervical lymphadenopathy Lungs- Clear to ausculation bilaterally, normal work of breathing Heart- Irregular rate and rhythm, no murmurs, rubs or gallops, PMI not laterally displaced GI- soft, NT, ND, + BS Extremities- no clubbing, cyanosis, or edema MS- no significant deformity or atrophy Skin- no rash or lesion Psych- euthymic mood, full affect Neuro- strength and sensation are intact  EKG afib rate controlled at 71 bpm. LPFB.  Echo- Left ventricle: The cavity size was normal. Wall thickness was normal. Systolic function was normal. The estimated ejection fraction was in the range of 50% to 55%. - Mitral valve: There was mild regurgitation. - Left atrium: The atrium was mildly dilated.    Epic records are reviewed at length today  Assessment and Plan:  1. Atrial fibrillation The patient has Symptomatic  persistent atrial fibrillation.  The patients CHAD2VASC score is  at least 3.  he is  appropriately anticoagulated at this time. The patient is adequately rate controlled with bysolic. Antiarrhythmic therapy to dates has included multaq.  The patients left atrial size is 33.5 mm.  Additional echo findings include Normal EF A long discussion with the patient was had today regarding therapeutic strategies.  Extensive discussion of lifestyle modification including attempt to lose weight .Marland Kitchen.  Presently, our recommendations include  1. Stop multaq due to failure to return to  sinus rhythm and adverse effects if remains on drug and in permament afib   on drug as per PALLAS trial. Therapeutic strategies for afib including medicine and ablation were discussed in detail with the patient today. Risk, benefits, and alternatives to EP study and radiofrequency ablation for afib were also discussed in detail today. These risks include but are not limited to stroke, bleeding, vascular damage, tamponade, perforation, damage to the esophagus, lungs, and other structures, pulmonary vein stenosis, worsening renal function, and death. The patient understands these risk and wishes to proceed with Tikosyn at this time. He will be set up with PharmD to discuss and set up hospitalization. Stop multaq.  2. Morbid obesity As above, lifestyle modification was discussed at length including regular exercise and weight reduction.  3. History of snoring Pt states he use to snore heavily but does not do so any longer, but does have a few possible symptoms of sleep apnea. Will defer sleep study for now but may have to be considered for the future if pt does not respond to treatment as expected.  4.This patients CHA2DS2-VASc Score and unadjusted Ischemic Stroke Rate (% per year) is equal to 3.2 % stroke rate/year from a score of 3.  Above score calculated as 1 point each if present [CHF, HTN, DM, Vascular=MI/PAD/Aortic Plaque, Age if 65-74, or Male] Above score calculated as 2 points each if present [Age > 75, or Stroke/TIA/TE]   Rudi Coco NP  Nurse Practitioner, [facility]   I have seen, examined the patient, and reviewed the above assessment and plan with Rudi Coco NP.  Changes to above are made where necessary.   The patient is quite symptomatic with afib and is at risk for decompensation/ hospitalization.  The patient has failed medical therapy with multaq.  I will therefore arrange for admission to initiate tikosyn in the next few days.  We will ask  our tikosyn clinic to arrange hospitalization.    Co Sign: Hillis Range, MD 04/28/2014 11:08 PM

## 2014-05-20 ENCOUNTER — Ambulatory Visit (INDEPENDENT_AMBULATORY_CARE_PROVIDER_SITE_OTHER): Payer: Federal, State, Local not specified - PPO | Admitting: Pharmacist

## 2014-05-20 ENCOUNTER — Inpatient Hospital Stay (HOSPITAL_COMMUNITY)
Admission: AD | Admit: 2014-05-20 | Discharge: 2014-05-23 | DRG: 310 | Disposition: A | Discharge status: Home / Self Care | Payer: Federal, State, Local not specified - PPO | Source: Ambulatory Visit | Attending: Internal Medicine | Admitting: Internal Medicine

## 2014-05-20 DIAGNOSIS — Z6835 Body mass index (BMI) 35.0-35.9, adult: Secondary | ICD-10-CM

## 2014-05-20 DIAGNOSIS — Z951 Presence of aortocoronary bypass graft: Secondary | ICD-10-CM | POA: Diagnosis not present

## 2014-05-20 DIAGNOSIS — Z9861 Coronary angioplasty status: Secondary | ICD-10-CM

## 2014-05-20 DIAGNOSIS — K219 Gastro-esophageal reflux disease without esophagitis: Secondary | ICD-10-CM | POA: Diagnosis present

## 2014-05-20 DIAGNOSIS — E1159 Type 2 diabetes mellitus with other circulatory complications: Secondary | ICD-10-CM | POA: Diagnosis not present

## 2014-05-20 DIAGNOSIS — E119 Type 2 diabetes mellitus without complications: Secondary | ICD-10-CM | POA: Diagnosis present

## 2014-05-20 DIAGNOSIS — I10 Essential (primary) hypertension: Secondary | ICD-10-CM | POA: Diagnosis not present

## 2014-05-20 DIAGNOSIS — I4819 Other persistent atrial fibrillation: Secondary | ICD-10-CM | POA: Diagnosis present

## 2014-05-20 DIAGNOSIS — E782 Mixed hyperlipidemia: Secondary | ICD-10-CM | POA: Diagnosis present

## 2014-05-20 DIAGNOSIS — I481 Persistent atrial fibrillation: Secondary | ICD-10-CM

## 2014-05-20 DIAGNOSIS — E785 Hyperlipidemia, unspecified: Secondary | ICD-10-CM | POA: Diagnosis present

## 2014-05-20 DIAGNOSIS — I252 Old myocardial infarction: Secondary | ICD-10-CM

## 2014-05-20 DIAGNOSIS — Z862 Personal history of diseases of the blood and blood-forming organs and certain disorders involving the immune mechanism: Secondary | ICD-10-CM

## 2014-05-20 DIAGNOSIS — F1010 Alcohol abuse, uncomplicated: Secondary | ICD-10-CM | POA: Diagnosis present

## 2014-05-20 DIAGNOSIS — I2510 Atherosclerotic heart disease of native coronary artery without angina pectoris: Secondary | ICD-10-CM | POA: Diagnosis present

## 2014-05-20 DIAGNOSIS — E669 Obesity, unspecified: Secondary | ICD-10-CM | POA: Diagnosis present

## 2014-05-20 DIAGNOSIS — D509 Iron deficiency anemia, unspecified: Secondary | ICD-10-CM | POA: Diagnosis present

## 2014-05-20 LAB — MAGNESIUM
MAGNESIUM: 1.8 mg/dL (ref 1.5–2.5)
Magnesium: 1.9 mg/dL (ref 1.5–2.5)

## 2014-05-20 LAB — BASIC METABOLIC PANEL
Anion gap: 17 — ABNORMAL HIGH (ref 5–15)
BUN: 23 mg/dL (ref 6–23)
BUN: 20 mg/dL (ref 6–23)
CHLORIDE: 102 meq/L (ref 96–112)
CO2: 24 mEq/L (ref 19–32)
CO2: 22 mEq/L (ref 19–32)
Calcium: 9.6 mg/dL (ref 8.4–10.5)
Calcium: 9.5 mg/dL (ref 8.4–10.5)
Chloride: 99 mEq/L (ref 96–112)
Creatinine, Ser: 1.1 mg/dL (ref 0.4–1.5)
Creatinine, Ser: 1.04 mg/dL (ref 0.50–1.35)
GFR calc Af Amer: >90 mL/min (ref >90)
GFR calc non Af Amer: 81 mL/min — ABNORMAL LOW (ref >90)
GFR: 71.57 mL/min (ref >60.00)
GLUCOSE: 141 mg/dL — AB (ref 70–99)
Glucose, Bld: 146 mg/dL — ABNORMAL HIGH (ref 70–99)
POTASSIUM: 4 meq/L (ref 3.5–5.1)
POTASSIUM: 4.3 meq/L (ref 3.7–5.3)
Sodium: 135 mEq/L (ref 135–145)
Sodium: 138 mEq/L (ref 137–147)

## 2014-05-20 LAB — GLUCOSE, CAPILLARY
GLUCOSE-CAPILLARY: 149 mg/dL — AB (ref 70–99)
GLUCOSE-CAPILLARY: 172 mg/dL — AB (ref 70–99)

## 2014-05-20 MED ORDER — FERROUS SULFATE 325 (65 FE) MG PO TABS
325.0000 mg | ORAL_TABLET | Freq: Two times a day (BID) | ORAL | Status: DC
  Administered 2014-05-21 – 2014-05-23 (×5): 325 mg via ORAL
  Filled 2014-05-20 (×5): qty 1

## 2014-05-20 MED ORDER — SODIUM CHLORIDE 0.9 % IJ SOLN
3.0000 mL | Freq: Two times a day (BID) | INTRAMUSCULAR | Status: DC
  Administered 2014-05-21 – 2014-05-22 (×3): 3 mL via INTRAVENOUS

## 2014-05-20 MED ORDER — DOFETILIDE 500 MCG PO CAPS
500.0000 ug | ORAL_CAPSULE | Freq: Two times a day (BID) | ORAL | Status: DC
  Administered 2014-05-20 – 2014-05-23 (×6): 500 ug via ORAL
  Filled 2014-05-20 (×6): qty 1

## 2014-05-20 MED ORDER — SODIUM BICARBONATE 650 MG PO TABS
650.0000 mg | ORAL_TABLET | Freq: Every day | ORAL | Status: DC
  Administered 2014-05-21 – 2014-05-23 (×3): 650 mg via ORAL
  Filled 2014-05-20 (×3): qty 1

## 2014-05-20 MED ORDER — LOSARTAN POTASSIUM 25 MG PO TABS
25.0000 mg | ORAL_TABLET | Freq: Every day | ORAL | Status: DC
  Administered 2014-05-20 – 2014-05-23 (×4): 25 mg via ORAL
  Filled 2014-05-20 (×4): qty 1

## 2014-05-20 MED ORDER — NEBIVOLOL HCL 5 MG PO TABS
5.0000 mg | ORAL_TABLET | Freq: Every day | ORAL | Status: DC
  Administered 2014-05-21 – 2014-05-23 (×3): 5 mg via ORAL
  Filled 2014-05-20 (×3): qty 1

## 2014-05-20 MED ORDER — SODIUM CHLORIDE 0.9 % IJ SOLN: 3.0000 mL | INTRAMUSCULAR | Status: DC | PRN

## 2014-05-20 MED ORDER — RIVAROXABAN 20 MG PO TABS
20.0000 mg | ORAL_TABLET | Freq: Every day | ORAL | Status: DC
  Administered 2014-05-21 – 2014-05-22 (×2): 20 mg via ORAL
  Filled 2014-05-20 (×3): qty 1

## 2014-05-20 MED ORDER — ATORVASTATIN CALCIUM 40 MG PO TABS
40.0000 mg | ORAL_TABLET | Freq: Every day | ORAL | Status: DC
  Administered 2014-05-20 – 2014-05-22 (×3): 40 mg via ORAL
  Filled 2014-05-20 (×3): qty 1

## 2014-05-20 MED ORDER — SODIUM CHLORIDE 0.9 % IJ SOLN
3.0000 mL | Freq: Two times a day (BID) | INTRAMUSCULAR | Status: DC
  Administered 2014-05-20 – 2014-05-21 (×2): 3 mL via INTRAVENOUS

## 2014-05-20 MED ORDER — ONDANSETRON HCL 4 MG/2ML IJ SOLN: 4.0000 mg | Freq: Four times a day (QID) | INTRAMUSCULAR | Status: DC | PRN

## 2014-05-20 MED ORDER — ACETAMINOPHEN 325 MG PO TABS: 650.0000 mg | ORAL_TABLET | ORAL | Status: DC | PRN

## 2014-05-20 MED ORDER — METFORMIN HCL 500 MG PO TABS
1000.0000 mg | ORAL_TABLET | Freq: Two times a day (BID) | ORAL | Status: DC
  Administered 2014-05-21 – 2014-05-23 (×5): 1000 mg via ORAL
  Filled 2014-05-20 (×5): qty 2

## 2014-05-20 MED ORDER — PANTOPRAZOLE SODIUM 40 MG PO TBEC
40.0000 mg | DELAYED_RELEASE_TABLET | Freq: Every day | ORAL | Status: DC
  Administered 2014-05-21 – 2014-05-23 (×3): 40 mg via ORAL
  Filled 2014-05-20 (×3): qty 1

## 2014-05-20 MED ORDER — SODIUM CHLORIDE 0.9 % IV SOLN: 250.0000 mL | INTRAVENOUS | Status: DC | PRN

## 2014-05-20 MED ORDER — OMEPRAZOLE-SODIUM BICARBONATE 20-1100 MG PO CAPS: 1.0000 | ORAL_CAPSULE | Freq: Every day | ORAL | Status: DC

## 2014-05-20 MED ORDER — INSULIN ASPART 100 UNIT/ML ~~LOC~~ SOLN
0.0000 [IU] | Freq: Three times a day (TID) | SUBCUTANEOUS | Status: DC
  Administered 2014-05-21: 2 [IU] via SUBCUTANEOUS
  Administered 2014-05-21: 3 [IU] via SUBCUTANEOUS
  Administered 2014-05-22 – 2014-05-23 (×4): 2 [IU] via SUBCUTANEOUS

## 2014-05-20 NOTE — Progress Notes (Signed)
Pharmacy Consult for Dofetilide (Tikosyn) Initiation  Admit Complaint: 53 y.o. male admitted 05/20/2014 with atrial fibrillation to be initiated on dofetilide.   Assessment:  Patient Exclusion Criteria: If any screening criteria checked as "Yes", then  patient  should NOT receive dofetilide until criteria item is corrected. If "Yes" please indicate correction plan.  YES  NO Patient  Exclusion Criteria Correction Plan  []  [x]  Baseline QTc interval is greater than or equal to 440 msec. IF above YES box checked dofetilide contraindicated unless patient has ICD; then may proceed if QTc 500-550 msec or with known ventricular conduction abnormalities may proceed with QTc 550-600 msec. QTc = 423 sec   []  [x]  Magnesium level is less than 1.8 mEq/l : Last magnesium:  Lab Results  Component Value Date   MG 1.8 05/20/2014         []  [x]  Potassium level is less than 4 mEq/l : Last potassium:  Lab Results  Component Value Date   K 4.0 05/20/2014         []  [x]  Patient is known or suspected to have a digoxin level greater than 2 ng/ml: No results found for: DIGOXIN    []  [x]  Creatinine clearance less than 20 ml/min (calculated using Cockcroft-Gault, actual body weight and serum creatinine): Estimated Creatinine Clearance: 104.7 mL/min (by C-G formula based on Cr of 1.1).    []  [x]  Patient has received drugs known to prolong the QT intervals within the last 48 hours(phenothiazines, tricyclics or tetracyclic antidepressants, erythromycin, H-1 antihistamines, cisapride, fluoroquinolones, azithromycin). Drugs not listed above may have an, as yet, undetected potential to prolong the QT interval, updated information on QT prolonging agents is available at this website:QT prolonging agents Stopped Multaq 2 weeks ago  []  [x]  Patient received a dose of hydrochlorothiazide (Oretic) alone or in any combination including triamterene (Dyazide, Maxzide) in the last 48 hours.   []  [x]  Patient received a  medication known to increase dofetilide plasma concentrations prior to initial dofetilide dose:  . Trimethoprim (Primsol, Proloprim) in the last 36 hours . Verapamil (Calan, Verelan) in the last 36 hours or a sustained release dose in the last 72 hours . Megestrol (Megace) in the last 5 days  . Cimetidine (Tagamet) in the last 6 hours . Ketoconazole (Nizoral) in the last 24 hours . Itraconazole (Sporanox) in the last 48 hours  . Prochlorperazine (Compazine) in the last 36 hours    []  [x]  Patient is known to have a history of torsades de pointes; congenital or acquired long QT syndromes.   []  [x]  Patient has received a Class 1 antiarrhythmic with less than 2 half-lives since last dose. (Disopyramide, Quinidine, Procainamide, Lidocaine, Mexiletine, Flecainide, Propafenone)   []  [x]  Patient has received amiodarone therapy in the past 3 months or amiodarone level is greater than 0.3 ng/ml.    Patient has been appropriately anticoagulated with Xarelto.  Ordering provider was confirmed at [email or web address] if they are not listed on the [facility] Authorized Prescribers list.  Goal of Therapy: Follow renal function, electrolytes, potential drug interactions, and dose adjustment. Provide education and 1 week supply at discharge.  Plan:  [x]   Physician selected initial dose within range recommended for patients level of renal function - will monitor for response.  []   Physician selected initial dose outside of range recommended for patients level of renal function - will discuss if the dose should be altered at this time.   Select One Calculated CrCl  Dose q12h  [x]  > 60 ml/min 500  mcg  []  40-60 ml/min 250 mcg  []  20-40 ml/min 125 mcg   2. Follow up QTc after the first 5 doses, renal function, electrolytes (K & Mg) daily x 3     days, dose adjustment, success of initiation and facilitate 1 week discharge supply as     clinically indicated.  3. Initiate Tikosyn education video (Call [phone] and ask  for video # 116).  4. Place Enrollment Form on the chart for discharge supply of dofetilide.   Helon Wisinski 4:35 PM 05/20/2014

## 2014-05-20 NOTE — H&P (Signed)
ELECTROPHYSIOLOGY ADMISSION  NOTE  Patient ID: [name], MRN: [id number], DOB/AGE: 52-23-1962 52 y.o. Admit date: 05/20/2014 Date of Consult: 05/20/2014  Primary Physician: Milana Obey, MD Primary Cardiologist:JA/MCr   Chief Complaint: ATRIAL FIBRILLATON   HPI [name] is a 53 y.o. male  Admitted with persistent atrial fibrillation for initiation of dofetilide.  He has history of coronary disease for which she underwent bypass surgery in 2008. He had postoperative atrial fibrillation that reverted to sinus rhythm. This resolved with some time in the fall 2015 he was found to be in atrial fibrillation after he presented to his PCP with symptoms of fatigue. He was treated with dronaderone and Rivaroxaban with improvement in symptoms. He was seen in the A. fib clinic and was recommended that he be admitted for dofetilide initiation  Recently he denies symptoms of palpitations chest pain shortness of breath orthopnea. He did not have symptoms consistent or suggestive of sleep apnea    CHADS-VASc score is greater than or equal to 3 (hypertension, diabetes, vascular disease)   He has left atrial enlargement (56/2.2/33.8)   Past Medical History  Diagnosis Date  . Heart disease   . Hypertension   . Diabetes   . High cholesterol   . Dysrhythmia   . Myocardial infarction   . Coronary artery disease       Surgical History:  Past Surgical History  Procedure Laterality Date  . Coronary angioplasty    . Coronary artery bypass graft       Home Meds: Prior to Admission medications   Medication Sig Start Date End Date Taking? Authorizing Provider  ACCU-CHEK AVIVA PLUS test strip  02/08/14   Historical Provider, MD  atorvastatin (LIPITOR) 40 MG tablet Take 40 mg by mouth daily at 6 PM. Generic only 03/26/14   Historical Provider, MD  BYSTOLIC 10 MG tablet Take 5 mg by mouth every morning.  02/08/14   Historical Provider, MD  ferrous sulfate 325 (65 FE) MG tablet Take  325 mg by mouth 2 (two) times daily.    Historical Provider, MD  furosemide (LASIX) 40 MG tablet Take 1-2 tablets by mouth daily as needed for fluid 03/26/14   Historical Provider, MD  JANUVIA 100 MG tablet Take 100 mg by mouth daily.  02/05/14   Historical Provider, MD  Lancets (ACCU-CHEK MULTICLIX) lancets  02/08/14   Historical Provider, MD  LORazepam (ATIVAN) 0.5 MG tablet Take 0.5 mg by mouth at bedtime.  03/09/14   Historical Provider, MD  losartan (COZAAR) 50 MG tablet Take 25 mg by mouth daily.  02/08/14   Historical Provider, MD  metFORMIN (GLUCOPHAGE) 1000 MG tablet Take 1,000 mg by mouth 2 (two) times daily with a meal.  01/17/14   Historical Provider, MD  Multiple Vitamins-Minerals (MULTIVITAMIN WITH MINERALS) tablet Take 1 tablet by mouth 2 (two) times daily.     Historical Provider, MD  Omeprazole-Sodium Bicarbonate (ZEGERID OTC PO) Take 1 capsule by mouth daily.    Historical Provider, MD  potassium chloride SA (K-DUR,KLOR-CON) 20 MEQ tablet Take 10 mEq by mouth daily.     Historical Provider, MD  XARELTO 20 MG TABS tablet Take 1 tablet by mouth daily. 04/22/14   Historical Provider, MD      Allergies: No Known Allergies  History   Social History  . Marital Status: Married    Spouse Name: N/A    Number of Children: N/A  . Years of Education: N/A   Occupational History  .  Not on file.   Social History Main Topics  . Smoking status: Never Smoker   . Smokeless tobacco: Not on file  . Alcohol Use: 0.5 oz/week    1 drink(s) per week  . Drug Use: No  . Sexual Activity: Not on file   Other Topics Concern  . Not on file   Social History Narrative  . No narrative on file     No family history on file.   ROS:  Please see the history of present illness.     All other systems reviewed and negative.    Physical Exam:   Blood pressure 133/83, pulse 87, temperature 97.4 F (36.3 C), temperature source Oral, resp. rate 18, height 6' (1.829 m), SpO2 99 %. General: Well  developed, well nourished male in no acute distress. Head: Normocephalic, atraumatic, sclera non-icteric, no xanthomas, nares are without discharge. EENT: normal Lymph Nodes:  none Back: without scoliosis/kyphosis *, no CVA tendersness Neck: Negative for carotid bruits. JVD not elevated. Lungs: Clear bilaterally to auscultation without wheezes, rales, or rhonchi. Breathing is unlabored. irregularly irregular rhythm without significant  murmur , rubs, or gallops appreciated. Abdomen: Soft, non-tender, non-distended with normoactive bowel sounds. No hepatomegaly. No rebound/guarding. No obvious abdominal masses. Msk:  Strength and tone appear normal for age. Extremities: No clubbing or cyanosis. No edema.  Distal pedal pulses are 2+ and equal bilaterally. Skin: Warm and Dry Neuro: Alert and oriented X 3. CN III-XII intact Grossly normal sensory and motor function . Psych:  Responds to questions appropriately with a normal affect.      Labs: Cardiac Enzymes No results for input(s): CKTOTAL, CKMB, TROPONINI in the last 72 hours. CBC Lab Results  Component Value Date   WBC 11.3* 04/26/2007   HGB 8.0* 04/26/2007   HCT 24.0* 04/26/2007   MCV 90.0 04/26/2007   PLT 333 04/26/2007   PROTIME: No results for input(s): LABPROT, INR in the last 72 hours. Chemistry  Recent Labs Lab 05/20/14 0940  NA 135  K 4.0  CL 102  CO2 24  BUN 23  CREATININE 1.1  CALCIUM 9.6  GLUCOSE 146*   Lipids Lab Results  Component Value Date   CHOL  04/19/2007    118        ATP III CLASSIFICATION:  <200     mg/dL   Desirable  [id number]  mg/dL   Borderline High  >=914>=240    mg/dL   High   HDL 21* 78/29/562110/19/2008   LDLCALC  04/19/2007    63        Total Cholesterol/HDL:CHD Risk Coronary Heart Disease Risk Table                     Men   Women  1/2 Average Risk   3.4   3.3   TRIG 169* 04/19/2007   BNP No results found for: PROBNP Miscellaneous No results found for: DDIMER  Radiology/Studies:  No  results found.  EKG:  Atrial fibrillation with a QTC of 423 ms    Assessment and Plan:  Atrial fibrillation-persistent  Coronary artery disease with prior bypass surgery  Hypertension  The patient has persistent atrial fibrillation. He has modest left atrial dimensions with some discordance his measurements. It is reasonable to undertake dofetilide initiation. I reviewed with him the potential proarrhythmic risks. I've also told him that there is a 30% chance he will have restoration of sinus rhythm over the next 30 hours. He does not revert by Sunday  morning he should be cardioverted. Orders are written to keep him nothing by mouth on Sunday morning. Anesthesia will need to be contacted.  Sherryl MangesSteven Gurkaran Rahm

## 2014-05-20 NOTE — Assessment & Plan Note (Signed)
Pt's labs and QTc acceptable for Tikosyn admission.  Pt aware to report to hospital for admission

## 2014-05-20 NOTE — Progress Notes (Signed)
HPI  Letta KocherOtha B Giusto is a 53 y.o. male with a h/o persitent atrial fibrillation who presents for Tikosyn initiaton.  The patient was initially diagnosed with atrial fibrillation in 2008 post op from  Bypass surgery. He took amiodarone for a short time after surgery but he returned to SR. He has noticed that over the years he would has spells of his heart racing that would be brief. In September, he started to feel poorly and was found to be in afib. after presenting with symptoms of fatigue. He was started on Xarelto and Multaq .  He was seen in the afib clinic on 10/26.  He was back in atrial fibrillation so Multaq was stopped and planned to transition to Tikosyn.    Discussed potential side effects of Tikosyn including QTc prolongation with patient today.  He is aware to call the office if he misses more than 2 doses in a row.  Reviewed medication list.  He is currently not on any contraindicated or QTc prolongating medications.  He stopped Multaq ~3 weeks ago.  He reports compliance with Xarelto for the past 4 weeks.    EKG reviewed by Dr. Tenny Crawoss showed Atrial fibrillation with a vent rate of 85 bpm.  QTc 426msec.    Atrial Fibrillation Management history:  Previous antiarrhythmic drugs: Multaq (stopped 04/25/14), remoter use of amiodrone s/p bypass surgery (2008).  Previous cardioversions: none  Previous ablations: noe  CHADS2VASC score: at least 3  Anticoagulation history: Xarelto 20mg      Current Outpatient Prescriptions  Medication Sig Dispense Refill  . atorvastatin (LIPITOR) 40 MG tablet Take 40 mg by mouth daily at 6 PM. Generic only    . BYSTOLIC 10 MG tablet Take 5 mg by mouth every morning.     . ferrous sulfate 325 (65 FE) MG tablet Take 325 mg by mouth 2 (two) times daily.    . furosemide (LASIX) 40 MG tablet Take 1-2 tablets by mouth daily as needed for fluid    . JANUVIA 100 MG tablet Take 100 mg by mouth daily.     Marland Kitchen. LORazepam (ATIVAN) 0.5 MG tablet Take 0.5 mg by mouth at  bedtime.     Marland Kitchen. losartan (COZAAR) 50 MG tablet Take 25 mg by mouth daily.     . metFORMIN (GLUCOPHAGE) 1000 MG tablet Take 1,000 mg by mouth 2 (two) times daily with a meal.     . Multiple Vitamins-Minerals (MULTIVITAMIN WITH MINERALS) tablet Take 1 tablet by mouth 2 (two) times daily.     [name]. Omeprazole-Sodium Bicarbonate (ZEGERID OTC PO) Take 1 capsule by mouth daily.    . potassium chloride SA (K-DUR,KLOR-CON) 20 MEQ tablet Take 10 mEq by mouth daily.     [name]. XARELTO 20 MG TABS tablet Take 1 tablet by mouth daily.    Marland Kitchen. ACCU-CHEK AVIVA PLUS test strip     . Lancets (ACCU-CHEK MULTICLIX) lancets      No current facility-administered medications for this visit.    No Known Allergies

## 2014-05-21 ENCOUNTER — Encounter (HOSPITAL_COMMUNITY): Payer: Self-pay | Admitting: *Deleted

## 2014-05-21 LAB — BASIC METABOLIC PANEL
Anion gap: 15 (ref 5–15)
BUN: 20 mg/dL (ref 6–23)
CALCIUM: 9.4 mg/dL (ref 8.4–10.5)
CO2: 25 mEq/L (ref 19–32)
Chloride: 101 mEq/L (ref 96–112)
Creatinine, Ser: 1.09 mg/dL (ref 0.50–1.35)
GFR calc Af Amer: 88 mL/min — ABNORMAL LOW (ref >90)
GFR calc non Af Amer: 76 mL/min — ABNORMAL LOW (ref >90)
GLUCOSE: 130 mg/dL — AB (ref 70–99)
Potassium: 4 mEq/L (ref 3.7–5.3)
Sodium: 141 mEq/L (ref 137–147)

## 2014-05-21 LAB — GLUCOSE, CAPILLARY
GLUCOSE-CAPILLARY: 116 mg/dL — AB (ref 70–99)
GLUCOSE-CAPILLARY: 122 mg/dL — AB (ref 70–99)
GLUCOSE-CAPILLARY: 132 mg/dL — AB (ref 70–99)
Glucose-Capillary: 152 mg/dL — ABNORMAL HIGH (ref 70–99)

## 2014-05-21 LAB — MAGNESIUM: Magnesium: 2 mg/dL (ref 1.5–2.5)

## 2014-05-21 MED ORDER — OFF THE BEAT BOOK
Freq: Once | Status: AC
  Administered 2014-05-21: 06:00:00
  Filled 2014-05-21: qty 1

## 2014-05-21 NOTE — Plan of Care (Signed)
Problem: Phase II Progression Outcomes Goal: Pain controlled Outcome: Completed/Met Date Met:  05/21/14     

## 2014-05-21 NOTE — Plan of Care (Signed)
Problem: Phase I Progression Outcomes Goal: Ventricular heart rate < 120/min Outcome: Completed/Met Date Met:  05/21/14 Goal: Anticoagulation Therapy per MD order Outcome: Completed/Met Date Met:  05/21/14 Goal: Heart rate or rhythm control medication Outcome: Completed/Met Date Met:  05/21/14 Goal: Pain controlled with appropriate interventions Outcome: Completed/Met Date Met:  05/21/14 Goal: Hemodynamically stable Outcome: Completed/Met Date Met:  05/21/14

## 2014-05-21 NOTE — Progress Notes (Signed)
RN called because the patient had spontaneously converted to sinus rhythm. ECG pending to confirm.

## 2014-05-21 NOTE — Progress Notes (Signed)
Subjective:  No complaints overnight  Objective:  Vital Signs in the last 24 hours: BP 106/75 mmHg  Pulse 63  Temp(Src) 97.5 F (36.4 C) (Oral)  Resp 20  Ht 6' (1.829 m)  Wt 119.342 kg (263 lb 1.6 oz)  BMI 35.68 kg/m2  SpO2 98%  Physical Exam: Pleasant obese white male in no acute distress Lungs:  Clear  Cardiac:  Irregular rhythm, normal S1 and S2, no S3 Abdomen:  Soft, nontender, no masses Extremities:  No edema present  Intake/Output from previous day: 11/20 0701 - 11/21 0700 In: 720 [P.O.:720] Out: -  Weight Filed Weights   05/20/14 1500 05/21/14 0558  Weight: 119.1 kg (262 lb 9.1 oz) 119.342 kg (263 lb 1.6 oz)    Lab Results: Basic Metabolic Panel:  Recent Labs  57/84/6911/20/15 1640 05/21/14 0342  NA 138 141  K 4.3 4.0  CL 99 101  CO2 22 25  GLUCOSE 141* 130*  BUN 20 20  CREATININE 1.04 1.09   Telemetry: Atrial fibrillation with controlled ventricular response-QTC 0.44  Assessment/Plan:  1.  Persistent atrial fibrillation.  Tolerating dofetilide well. 2.  Coronary artery disease with previous bypass grafting. 3.  Obesity  Recommendations:  Continue dofetilide today.  If doesn't convert overnight.  Plan cardioversion tomorrow. Cardioversion discussed with patient including risks of stroke or arrhythmia, and he is agreeable.   Darden PalmerW. Spencer Imajean Mcdermid, Jr.  MD [facility] Card[facility]  05/21/2014, 7:59 AM

## 2014-05-21 NOTE — Progress Notes (Signed)
Pt has converted to NSR HR 75. MD notified, will get 12 lead and place in chart. Will continue to monitor. Cecille Rubinhompson,Jakara Blatter V, RN

## 2014-05-22 LAB — GLUCOSE, CAPILLARY
GLUCOSE-CAPILLARY: 458 mg/dL — AB (ref 70–99)
GLUCOSE-CAPILLARY: 506 mg/dL — AB (ref 70–99)
GLUCOSE-CAPILLARY: 125 mg/dL — AB (ref 70–99)
Glucose-Capillary: 126 mg/dL — ABNORMAL HIGH (ref 70–99)
Glucose-Capillary: 139 mg/dL — ABNORMAL HIGH (ref 70–99)
Glucose-Capillary: 135 mg/dL — ABNORMAL HIGH (ref 70–99)

## 2014-05-22 LAB — BASIC METABOLIC PANEL
Anion gap: 17 — ABNORMAL HIGH (ref 5–15)
BUN: 16 mg/dL (ref 6–23)
CALCIUM: 9.1 mg/dL (ref 8.4–10.5)
CO2: 20 meq/L (ref 19–32)
Chloride: 102 mEq/L (ref 96–112)
Creatinine, Ser: 1.01 mg/dL (ref 0.50–1.35)
GFR calc non Af Amer: 84 mL/min — ABNORMAL LOW (ref >90)
Glucose, Bld: 145 mg/dL — ABNORMAL HIGH (ref 70–99)
Potassium: 4.3 mEq/L (ref 3.7–5.3)
Sodium: 139 mEq/L (ref 137–147)

## 2014-05-22 LAB — MAGNESIUM: MAGNESIUM: 1.9 mg/dL (ref 1.5–2.5)

## 2014-05-22 NOTE — Plan of Care (Signed)
Problem: Phase II Progression Outcomes Goal: Ventricular heart rate < 100/min Outcome: Progressing Goal: Anticoagulation Therapy per MD order Outcome: Completed/Met Date Met:  05/22/14 Goal: Progress activity as tolerated unless otherwise ordered Outcome: Completed/Met Date Met:  05/22/14 Goal: Discharge plan established Outcome: Progressing Goal: Tolerating diet Outcome: Completed/Met Date Met:  05/22/14  Problem: Phase III Progression Outcomes Goal: Sinus rhythm established or heart rate < 100 at rest Outcome: Completed/Met Date Met:  05/22/14 Goal: Anticoagulation Therapy per MD order Outcome: Completed/Met Date Met:  05/22/14 Goal: Pain controlled on oral analgesia Outcome: Completed/Met Date Met:  05/22/14 Goal: Tolerating diet Outcome: Completed/Met Date Met:  05/22/14

## 2014-05-22 NOTE — Progress Notes (Signed)
Subjective:  He converted to sinus rhythm.  QTC is currently 0.43.  Feels well  Objective:  Vital Signs in the last 24 hours: BP 121/79 mmHg  Pulse 82  Temp(Src) 97.6 F (36.4 C) (Oral)  Resp 19  Ht 6' (1.829 m)  Wt 118.434 kg (261 lb 1.6 oz)  BMI 35.40 kg/m2  SpO2 98%  Physical Exam: Pleasant obese white male in no acute distress Lungs:  Clear  Cardiac:  Regular rhythm, normal S1 and S2, no S3  Intake/Output from previous day: 11/21 0701 - 11/22 0700 In: 1443 [P.O.:1440; I.V.:3] Out: -  Weight Filed Weights   05/20/14 1500 05/21/14 0558 05/22/14 0500  Weight: 119.1 kg (262 lb 9.1 oz) 119.342 kg (263 lb 1.6 oz) 118.434 kg (261 lb 1.6 oz)    Lab Results: Basic Metabolic Panel:  Recent Labs  40/98/1111/21/15 0342 05/22/14 0455  NA 141 139  K 4.0 4.3  CL 101 102  CO2 25 20  GLUCOSE 130* 145*  BUN 20 16  CREATININE 1.09 1.01   Telemetry: Normal sinus rhythm, QTC 0.43  Assessment/Plan:  1.  Persistent atrial fibrillation -converted to sinus rhythm on dofetilide  2.  Coronary artery disease with previous bypass grafting. 3.  Obesity  Recommendations:  EKG from last night reviewed.  QTC was 0.465 last night.  Clinically doing well.  Continue dofetilide load.   Darden PalmerW. Spencer Tilley, Jr.  MD [facility] Cardiology  05/22/2014, 8:20 AM

## 2014-05-23 DIAGNOSIS — E782 Mixed hyperlipidemia: Secondary | ICD-10-CM

## 2014-05-23 DIAGNOSIS — E1151 Type 2 diabetes mellitus with diabetic peripheral angiopathy without gangrene: Secondary | ICD-10-CM

## 2014-05-23 DIAGNOSIS — I10 Essential (primary) hypertension: Secondary | ICD-10-CM

## 2014-05-23 LAB — BASIC METABOLIC PANEL
ANION GAP: 14 (ref 5–15)
BUN: 17 mg/dL (ref 6–23)
CALCIUM: 9.3 mg/dL (ref 8.4–10.5)
CO2: 22 meq/L (ref 19–32)
Chloride: 104 mEq/L (ref 96–112)
Creatinine, Ser: 0.91 mg/dL (ref 0.50–1.35)
GFR calc Af Amer: >90 mL/min (ref >90)
GFR calc non Af Amer: >90 mL/min (ref >90)
GLUCOSE: 137 mg/dL — AB (ref 70–99)
POTASSIUM: 4.6 meq/L (ref 3.7–5.3)
SODIUM: 140 meq/L (ref 137–147)

## 2014-05-23 LAB — GLUCOSE, CAPILLARY: Glucose-Capillary: 130 mg/dL — ABNORMAL HIGH (ref 70–99)

## 2014-05-23 LAB — MAGNESIUM: MAGNESIUM: 2 mg/dL (ref 1.5–2.5)

## 2014-05-23 MED ORDER — DOFETILIDE 500 MCG PO CAPS
500.0000 ug | ORAL_CAPSULE | Freq: Two times a day (BID) | ORAL | Status: DC
Start: 2014-05-23 — End: 2014-05-23

## 2014-05-23 MED ORDER — DOFETILIDE 500 MCG PO CAPS: 500.0000 ug | ORAL_CAPSULE | Freq: Two times a day (BID) | ORAL | Status: DC

## 2014-05-23 NOTE — Care Management Note (Addendum)
    Page 1 of 1   05/23/2014     10:35:51 AM CARE MANAGEMENT NOTE 05/23/2014  Patient:  Letta KocherHODGES,Daishawn B   Account Number:  [id number]  Date Initiated:  05/23/2014  Documentation initiated by:  GRAVES-BIGELOW,Melondy Blanchard  Subjective/Objective Assessment:   Pt admitted for Tikosyn Load.     Action/Plan:   Benefits check in process and will make pt aware once completed.   Anticipated DC Date:  05/23/2014   Anticipated DC Plan:  HOME/SELF CARE      DC Planning Services  CM consult      Choice offered to / List presented to:             Status of service:  Completed, signed off Medicare Important Message given?  NO (If response is "NO", the following Medicare IM given date fields will be blank) Date Medicare IM given:   Medicare IM given by:   Date Additional Medicare IM given:   Additional Medicare IM given by:    Discharge Disposition:  HOME/SELF CARE  Per UR Regulation:  Reviewed for med. necessity/level of care/duration of stay  If discussed at Long Length of Stay Meetings, dates discussed:    Comments:  05-23-14 1033 Perlie GoldBrenda Graaves-Bigelow, RN, BSN [phone] CM did call [facility] and medication Tikosyn can be ordered. Pt will have Rx for 7 day supply before d/c filled via main pharmacy. Cost of medicine will be 120.02. Pt states he was given a 10.00 co pay card. No further needs from CM at this time.

## 2014-05-23 NOTE — Discharge Instructions (Signed)
***  PLEASE REMEMBER TO BRING ALL OF YOUR MEDICATIONS TO EACH OF YOUR FOLLOW-UP OFFICE VISITS.  

## 2014-05-23 NOTE — Plan of Care (Signed)
Problem: Phase I Progression Outcomes Goal: Initial discharge plan identified Outcome: Completed/Met Date Met:  05/23/14

## 2014-05-23 NOTE — Progress Notes (Signed)
Patient Name: [name] Date of Encounter: 05/23/2014    Principal Problem:   Persistent atrial fibrillation Active Problems:   CAD (coronary artery disease)   HTN (hypertension)   DM type 2 causing vascular disease   Obesity (BMI 30-39.9)   GASTROESOPHAGEAL REFLUX DISEASE   ANEMIA, IRON DEFICIENCY, HX OF   Mixed hyperlipidemia    SUBJECTIVE  Maintaining sinus rhythm.  No chest pain or sob.  Eager to go home.  CURRENT MEDS . atorvastatin  40 mg Oral q1800  . dofetilide  500 mcg Oral BID  . ferrous sulfate  325 mg Oral BID WC  . insulin aspart  0-15 Units Subcutaneous TID WC  . losartan  25 mg Oral Daily  . metFORMIN  1,000 mg Oral BID WC  . nebivolol  5 mg Oral Daily  . pantoprazole  40 mg Oral Q1200  . rivaroxaban  20 mg Oral Q supper  . sodium bicarbonate  650 mg Oral Q1200  . sodium chloride  3 mL Intravenous Q12H  . sodium chloride  3 mL Intravenous Q12H    OBJECTIVE  Filed Vitals:   05/21/14 2100 05/22/14 0500 05/22/14 2100 05/23/14 0500  BP: 127/69 121/79 137/88 141/70  Pulse: 80 82 78 71  Temp: 98.6 F (37 C) 97.6 F (36.4 C) 98.2 F (36.8 C) 97.7 F (36.5 C)  TempSrc:      Resp: 22 19 18 16   Height:      Weight:  261 lb 1.6 oz (118.434 kg)  260 lb (117.935 kg)  SpO2: 97% 98% 97% 98%    Intake/Output Summary (Last 24 hours) at 05/23/14 0742 Last data filed at 05/23/14 0500  Gross per 24 hour  Intake   1400 ml  Output    500 ml  Net    900 ml   Filed Weights   05/21/14 0558 05/22/14 0500 05/23/14 0500  Weight: 263 lb 1.6 oz (119.342 kg) 261 lb 1.6 oz (118.434 kg) 260 lb (117.935 kg)    PHYSICAL EXAM  General: Pleasant, NAD. Neuro: Alert and oriented X 3. Moves all extremities spontaneously. Psych: Normal affect. HEENT:  Normal  Neck: Supple without bruits or JVD. Lungs:  Resp regular and unlabored, CTA. Heart: RRR no s3, s4, or murmurs. Abdomen: Soft, non-tender, non-distended, BS + x 4.  Extremities: No clubbing, cyanosis or  edema. DP/PT/Radials 2+ and equal bilaterally.  Accessory Clinical Findings  Basic Metabolic Panel  Recent Labs  05/22/14 0455 05/23/14 0523  NA 139 140  K 4.3 4.6  CL 102 104  CO2 20 22  GLUCOSE 145* 137*  BUN 16 17  CREATININE 1.01 0.91  CALCIUM 9.1 9.3  MG 1.9 2.0   TELE  RSR  ECG  Rsr, 79, no acute st/t changes.  QTc = 415  ASSESSMENT AND PLAN  1.  PAF:  Admitted for tikosyn load.  Converted early yesterday morning.  Maintaining sinus rhythm.  QTc stable on 500 mcg bid and xarelto.  K/Creat/Mg stable.  Await sixth dose this AM.  F/U ECG ~ 10 AM with plan for d/c afterward provided that QTc stable.  F/U arranged for next week @ [facility] and in Dec in afib clinic.  2.  CAD:  S/p CABG in 2008.  No chest pain or sob.  Cont statin/bb.  No asa 2/2 xarelto.  3.  HTN: stable.  4.  HL:  On statin.  5.  DM:  On metformin/januvia.  Sugars stable this AM.  Signed, Cristal Deerhristopher  Brion AlimentBerge NP   I have seen, examined the patient, and reviewed the above assessment and plan.  Changes to above are made where necessary.  Doing well on tikosyn.  QT is stable and patient is now back in sinus. Will discharge to home today.  Co Sign: Hillis RangeAllred, Roni Friberg, MD 05/23/2014 8:10 AM

## 2014-05-23 NOTE — Discharge Summary (Signed)
Discharge Summary   Patient ID: [name],  MRN: [id number], DOB/AGE: 52-Nov-1962 52 y.o.  Admit date: 05/20/2014 Discharge date: 05/23/2014  Primary Care Provider: Milana ObeyKNOWLTON,STEPHEN D Primary Cardiologist: Judie PetitM. Croitoru/ J. Jefferey Lippmann, MD   Discharge Diagnoses Principal Problem:   Persistent atrial fibrillation  **s/p tikosyn loading this admission with successful conversion to sinus rhythm.  D/C QTc = 440 msec.  Active Problems:   CAD (coronary artery disease)   HTN (hypertension)   DM type 2 causing vascular disease   Obesity (BMI 30-39.9)   GASTROESOPHAGEAL REFLUX DISEASE   ANEMIA, IRON DEFICIENCY, HX OF   Mixed hyperlipidemia   Allergies No Known Allergies  Procedures   None.  History of Present Illness   53 year old male with a prior history of coronary artery disease status post coronary artery bypass grafting in 2008. He did have postoperative atrial fibrillation but subsequently reverted to sinus rhythm. Earlier this year, he was found to be back in atrial fibrillation with symptoms of fatigue. He was initially placed on multaq and xarelto with improvement in symptoms. Follow-up was arranged in atrial fibrillation clinic  Where he was found to be in rate controlled A. Fib ablation. Decision was made to discontinue multaq and arrange for elective admission for tikosyn loading.  Hospital Course  Patient was admitted on November 20 and placed on tikosyn 500 micrograms  Every 12 hours. Potassium, magnesium, and creatinine remained stable throughout hospitalization and patient converted to sinus rhythm on the afternoon of November 21. He has continued to maintain sinus rhythm and his QTC has been stable 440 msec. He'll be discharged home today in good condition. He will follow-up next week in for lab work and ECG and has follow-up in A. Fib clinic in December.  Discharge Vitals Blood pressure 141/70, pulse 71, temperature 97.7 F (36.5 C), temperature source Oral, resp.  rate 16, height 6' (1.829 m), weight 260 lb (117.935 kg), SpO2 98 %.  Filed Weights   05/21/14 0558 05/22/14 0500 05/23/14 0500  Weight: 263 lb 1.6 oz (119.342 kg) 261 lb 1.6 oz (118.434 kg) 260 lb (117.935 kg)    Labs  Basic Metabolic Panel  Recent Labs  05/22/14 0455 05/23/14 0523  NA 139 140  K 4.3 4.6  CL 102 104  CO2 20 22  GLUCOSE 145* 137*  BUN 16 17  CREATININE 1.01 0.91  CALCIUM 9.1 9.3  MG 1.9 2.0   Disposition  Pt is being discharged home today in good condition.  Follow-up Plans & Appointments      Follow-up Information    Follow up with Thurmon FairROITORU,MIHAI, MD On 05/30/2014.   Specialty:  Cardiology   Why:  8:30 AM   Contact information:   [address] [place] [address] [phone]       Follow up with Rudi CocoARROLL,DONNA, NP On 06/21/2014.   Specialty:  Nurse Practitioner   Why:  3:00 PM   Contact information:   [address] [place] [address] [phone]       Discharge Medications    Medication List    TAKE these medications        ACCU-CHEK AVIVA PLUS test strip  Generic drug:  glucose blood     accu-chek multiclix lancets     atorvastatin 40 MG tablet  Commonly known as:  LIPITOR  Take 40 mg by mouth daily at 6 PM. Generic only     BYSTOLIC 10 MG tablet  Generic drug:  nebivolol  Take 5 mg  by mouth every morning.     dofetilide 500 MCG capsule  Commonly known as:  TIKOSYN  Take 1 capsule (500 mcg total) by mouth 2 (two) times daily.     ferrous sulfate 325 (65 FE) MG tablet  Take 325 mg by mouth 2 (two) times daily.     furosemide 40 MG tablet  Commonly known as:  LASIX  Take 1-2 tablets by mouth daily as needed for fluid     JANUVIA 100 MG tablet  Generic drug:  sitaGLIPtin  Take 100 mg by mouth daily.     LORazepam 0.5 MG tablet  Commonly known as:  ATIVAN  Take 0.5 mg by mouth at bedtime.     losartan 50 MG tablet  Commonly known as:  COZAAR  Take 25 mg by mouth daily.     metFORMIN 1000  MG tablet  Commonly known as:  GLUCOPHAGE  Take 1,000 mg by mouth 2 (two) times daily with a meal.     multivitamin with minerals tablet  Take 1 tablet by mouth 2 (two) times daily.     potassium chloride SA 20 MEQ tablet  Commonly known as:  K-DUR,KLOR-CON  Take 10 mEq by mouth daily.     XARELTO 20 MG Tabs tablet  Generic drug:  rivaroxaban  Take 1 tablet by mouth daily.     ZEGERID OTC PO  Take 1 capsule by mouth daily.       Outstanding Labs/Studies  BMET, Mg, ECG in office f/u next week.  Duration of Discharge Encounter   Greater than 30 minutes including physician time.  Signed, [name] Duckinghristopher Berge NP 05/23/2014, 10:48 AM   Hillis RangeJames Jarelyn Bambach MD

## 2014-05-29 NOTE — Progress Notes (Signed)
Patient ID: [name], male   DOB: [birth year], 54 y.o.   MRN: [id number]      Reason for office visit CAD, atrial fibrillation   [name] recently started treatment with Tikosyn. Discharge QTc on 11/23 was 440 ms, in NSR. Today QTC is 414 ms, normal sinus rhythm at 78 bpm. He states that he definitely feels better after initiation of the drug, with less breathlessness.  He first presented with coronary disease at age 38 (bare-metal [id number] stent to proximal LAD in the setting of acute myocardial infarction) and had recurrent problems in 2007 (right coronary artery drug-eluting Taxus stents to the proximal RCA 3.0x12 and the mid PLA 2.5x16, followed by same admission placement of 2 drug-eluting Cypher 3.0x13 stents to the mid LAD). Bypass surgery was performed after repeat cardiac catheterization October 2008 showed 50% proximal LAD stent restenosis followed by aneurysmal dilatation and an 85% stenosis of the LAD involving the first diagonal branch as well as moderate lesions in the left circumflex and right coronary artery. He underwent four-vessel bypass surgery in 2008. (Dr. Laneta Simmers, LIMA to LAD, SVG to first diagonal, SVG to first OM, SVG to PDA).  His most recent functional study was an echocardiogram in 2009 that showed normal overall left ventricular systolic function with mild anteroseptal hypokinesis and a mild to moderately dilated left atrium. He has not had a nuclear stress test since his bypass procedure.  He described classical angina pectoris and shortness of breath before his coronary events. (substernal pressure and burning, different from his reflux pain). Angina has not recurred since his bypass  He did have postoperative paroxysmal atrial fibrillation, recurred in 2014, recorded incidentally during a routine exam. He is aware of the palpitations and thinks that he does a little worse when he is in the arrhythmia. Rate control is excellent, due to chronic treatment with beta  blockers. He does not have a history of stroke or TIA or other embolic events and does not have a known bleeding tendency or abnormal bleeding. CHADS2VASC score: at least 3. He failed Multaq therapy.  Additional problems include type 2 diabetes mellitus, obesity, hypertension and gastroesophageal reflux disease. He has mixed hyperlipidemia. He is a former smoker. He may have a history of asbestos exposure when he worked in the [employer] yard. He continues to have 2 full-time jobs. 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   No Known Allergies  Current Outpatient Prescriptions  Medication Sig Dispense Refill  . ACCU-CHEK AVIVA PLUS test strip     . atorvastatin (LIPITOR) 40 MG tablet Take 40 mg by mouth daily at 6 PM. Generic only    . BYSTOLIC 10 MG tablet Take 5 mg by mouth every morning.     . dofetilide (TIKOSYN) 500 MCG capsule Take 1 capsule (500 mcg total) by mouth 2 (two) times daily. 60 capsule 6  . ferrous sulfate 325 (65 FE) MG tablet Take 325 mg by mouth 2 (two) times daily.    . furosemide (LASIX) 40 MG tablet Take 1-2 tablets by mouth daily as needed for fluid    . JANUVIA 100 MG tablet Take 100 mg by mouth daily.     . Lancets (ACCU-CHEK MULTICLIX) lancets     . LORazepam (ATIVAN) 0.5 MG tablet Take 0.5 mg by mouth at bedtime.     Marland Kitchen losartan (COZAAR) 50 MG tablet Take 25 mg by mouth daily.     Marland Kitchen MELATONIN PO Take 1 tablet by mouth  at bedtime as needed.    . metFORMIN (GLUCOPHAGE) 1000 MG tablet Take 1,000 mg by mouth 2 (two) times daily with a meal.     . Multiple Vitamins-Minerals (MULTIVITAMIN WITH MINERALS) tablet Take 1 tablet by mouth 2 (two) times daily.     [name]. Omeprazole-Sodium Bicarbonate (ZEGERID OTC PO) Take 1 capsule by mouth daily.    . potassium chloride SA (K-DUR,KLOR-CON) 20 MEQ tablet Take 10 mEq by mouth daily.     [name]. XARELTO 20 MG TABS tablet Take 1 tablet by mouth daily.     No current facility-administered medications for this visit.     Past Medical History  Diagnosis Date  . Heart disease   . Hypertension   . Diabetes   . High cholesterol   . Dysrhythmia   . Myocardial infarction   . Coronary artery disease   . Edema   . H/O gastroesophageal reflux (GERD)   . Obesity   . Anemia     Postoperative  . Abdominal distention   . Pleural effusion     resolved with therapy    Past Surgical History  Procedure Laterality Date  . Coronary angioplasty    . Coronary artery bypass graft      X4  . Exercise cardiolite myocardial perfusion study    . Cardiac catheterization  2008    No family history on file.  History   Social History  . Marital Status: Married    Spouse Name: N/A    Number of Children: N/A  . Years of Education: N/A   Occupational History  . Not on file.   Social History Main Topics  . Smoking status: Never Smoker   . Smokeless tobacco: Not on file  . Alcohol Use: 0.5 oz/week    1 drink(s) per week  . Drug Use: No  . Sexual Activity: Not on file   Other Topics Concern  . Not on file   Social History Narrative    Review of systems: The patient specifically denies any chest pain at rest or with exertion, dyspnea at rest or with exertion, orthopnea, paroxysmal nocturnal dyspnea, syncope, palpitations, focal neurological deficits, intermittent claudication, lower extremity edema, unexplained weight gain, cough, hemoptysis or wheezing.  The patient also denies abdominal pain, nausea, vomiting, dysphagia, diarrhea, constipation, polyuria, polydipsia, dysuria, hematuria, frequency, urgency, abnormal bleeding or bruising, fever, chills, unexpected weight changes, mood swings, change in skin or hair texture, change in voice quality, auditory or visual problems, allergic reactions or rashes, new musculoskeletal complaints other than usual "aches and pains".   PHYSICAL EXAM BP 136/90 mmHg  Pulse 78  Ht 6' (1.829 m)  Wt 255 lb 8 oz (115.894 kg)  BMI 34.64 kg/m2 General: Alert, oriented  x3, no distress Head: no evidence of trauma, PERRL, EOMI, no exophtalmos or lid lag, no myxedema, no xanthelasma; normal ears, nose and oropharynx Neck: normal jugular venous pulsations and no hepatojugular reflux; brisk carotid pulses without delay and no carotid bruits Chest: clear to auscultation, no signs of consolidation by percussion or palpation, normal fremitus, symmetrical and full respiratory excursions Cardiovascular: normal position and quality of the apical impulse, regular rhythm, normal first and second heart sounds, no murmurs, rubs or gallops Abdomen: no tenderness or distention, no masses by palpation, no abnormal pulsatility or arterial bruits, normal bowel sounds, no hepatosplenomegaly Extremities: no clubbing, cyanosis or edema; 2+ radial, ulnar and brachial pulses bilaterally; 2+ right femoral, posterior tibial and dorsalis pedis pulses; 2+ left femoral, posterior tibial and  dorsalis pedis pulses; no subclavian or femoral bruits Neurological: grossly nonfocal  EKG: Normal sinus rhythm, vertical axis, QTC 414 ms  Lipid Panel     Component Value Date/Time   CHOL  04/19/2007 0326    118        ATP III CLASSIFICATION:  <200     mg/dL   Desirable  [id number]  mg/dL   Borderline High  >=098>=240    mg/dL   High   TRIG 119169* 14/78/295610/19/2008 0326   HDL 21* 04/19/2007 0326   CHOLHDL 5.6 04/19/2007 0326   VLDL 34 04/19/2007 0326   LDLCALC  04/19/2007 0326    63        Total Cholesterol/HDL:CHD Risk Coronary Heart Disease Risk Table                     Men   Women  1/2 Average Risk   3.4   3.3    BMET    Component Value Date/Time   NA 140 05/23/2014 0523   K 4.6 05/23/2014 0523   CL 104 05/23/2014 0523   CO2 22 05/23/2014 0523   GLUCOSE 137* 05/23/2014 0523   BUN 17 05/23/2014 0523   CREATININE 0.91 05/23/2014 0523   CALCIUM 9.3 05/23/2014 0523   GFRNONAA >90 05/23/2014 0523   GFRAA >90 05/23/2014 0523   ECHO 04/04/2014 Left ventricle: The cavity size was normal. Wall  thickness was normal. Systolic function was normal. The estimated ejection fraction was in the range of 50% to 55%. - Mitral valve: There was mild regurgitation. - Left atrium: The atrium was mildly dilated (Note low normal e' medial and lateral annulus velocities)  ASSESSMENT AND PLAN Persistent atrial fibrillation Failed Multaq. On anticoagulation without embolic or bleeding events. Was referred to discuss radiofrequency ablation with Dr. Hillis RangeJames Allred and was started on Tikosyn which has led to symptomatic improvement. Reviewed the risk of drug interactions and the need to supplement potassium when taking extra doses of diuretic. Repeat labs at least every 6 months.  Mixed hyperlipidemia Still need a repeat lipid profile.   CAD (coronary artery disease) He had typical exertional angina before all of his previous coronary events. He has no anginal complaints at this time. He does have shortness of breath which may be an expression of diastolic dysfunction/systolic dysfunction or even a manifestation of his atrial fibrillation. We'll have to review after his echocardiogram.  Junious SilkROITORU,Eirene Rather  Paisleigh Maroney, MD, [facility] [phone] office [phone] pager

## 2014-05-30 ENCOUNTER — Encounter: Payer: Self-pay | Admitting: Cardiovascular Disease

## 2014-05-30 ENCOUNTER — Ambulatory Visit (INDEPENDENT_AMBULATORY_CARE_PROVIDER_SITE_OTHER): Payer: Federal, State, Local not specified - PPO | Admitting: Cardiovascular Disease

## 2014-05-30 VITALS — BP 136/90 | HR 78 | Ht 72.0 in | Wt 255.5 lb

## 2014-05-30 DIAGNOSIS — R6889 Other general symptoms and signs: Secondary | ICD-10-CM

## 2014-05-30 DIAGNOSIS — Z79899 Other long term (current) drug therapy: Secondary | ICD-10-CM

## 2014-05-30 DIAGNOSIS — I481 Persistent atrial fibrillation: Secondary | ICD-10-CM

## 2014-05-30 DIAGNOSIS — Z5181 Encounter for therapeutic drug level monitoring: Secondary | ICD-10-CM

## 2014-05-30 DIAGNOSIS — I2510 Atherosclerotic heart disease of native coronary artery without angina pectoris: Secondary | ICD-10-CM

## 2014-05-30 DIAGNOSIS — E1151 Type 2 diabetes mellitus with diabetic peripheral angiopathy without gangrene: Secondary | ICD-10-CM

## 2014-05-30 DIAGNOSIS — E1159 Type 2 diabetes mellitus with other circulatory complications: Secondary | ICD-10-CM

## 2014-05-30 DIAGNOSIS — I4819 Other persistent atrial fibrillation: Secondary | ICD-10-CM

## 2014-05-30 DIAGNOSIS — E782 Mixed hyperlipidemia: Secondary | ICD-10-CM

## 2014-05-30 DIAGNOSIS — E669 Obesity, unspecified: Secondary | ICD-10-CM

## 2014-05-30 DIAGNOSIS — I10 Essential (primary) hypertension: Secondary | ICD-10-CM

## 2014-05-30 NOTE — Patient Instructions (Signed)
Dr. Royann Shiversroitoru recommends that you schedule a follow-up appointment in: 6 months  Your physician recommends that you return for lab work in: prior to [facility].

## 2014-05-31 ENCOUNTER — Encounter: Payer: Self-pay | Admitting: Cardiovascular Disease

## 2014-06-21 ENCOUNTER — Encounter (HOSPITAL_COMMUNITY): Payer: Self-pay | Admitting: Nurse Practitioner

## 2014-06-21 ENCOUNTER — Ambulatory Visit (HOSPITAL_COMMUNITY)
Admission: RE | Admit: 2014-06-21 | Discharge: 2014-06-21 | Disposition: A | Discharge status: Home / Self Care | Payer: Federal, State, Local not specified - PPO | Source: Ambulatory Visit | Attending: Nurse Practitioner | Admitting: Nurse Practitioner

## 2014-06-21 DIAGNOSIS — K219 Gastro-esophageal reflux disease without esophagitis: Secondary | ICD-10-CM | POA: Insufficient documentation

## 2014-06-21 DIAGNOSIS — I10 Essential (primary) hypertension: Secondary | ICD-10-CM | POA: Insufficient documentation

## 2014-06-21 DIAGNOSIS — E119 Type 2 diabetes mellitus without complications: Secondary | ICD-10-CM | POA: Diagnosis not present

## 2014-06-21 DIAGNOSIS — E780 Pure hypercholesterolemia: Secondary | ICD-10-CM | POA: Insufficient documentation

## 2014-06-21 DIAGNOSIS — I2510 Atherosclerotic heart disease of native coronary artery without angina pectoris: Secondary | ICD-10-CM | POA: Diagnosis not present

## 2014-06-21 DIAGNOSIS — Z79899 Other long term (current) drug therapy: Secondary | ICD-10-CM | POA: Diagnosis not present

## 2014-06-21 DIAGNOSIS — I252 Old myocardial infarction: Secondary | ICD-10-CM | POA: Insufficient documentation

## 2014-06-21 DIAGNOSIS — I481 Persistent atrial fibrillation: Secondary | ICD-10-CM

## 2014-06-21 DIAGNOSIS — I4819 Other persistent atrial fibrillation: Secondary | ICD-10-CM

## 2014-06-21 DIAGNOSIS — Z7902 Long term (current) use of antithrombotics/antiplatelets: Secondary | ICD-10-CM | POA: Diagnosis not present

## 2014-06-21 LAB — BASIC METABOLIC PANEL
Anion gap: 10 (ref 5–15)
BUN: 19 mg/dL (ref 6–23)
CALCIUM: 9.4 mg/dL (ref 8.4–10.5)
CO2: 23 mmol/L (ref 19–32)
Chloride: 104 mEq/L (ref 96–112)
Creatinine, Ser: 1.03 mg/dL (ref 0.50–1.35)
GFR calc Af Amer: >90 mL/min (ref >90)
GFR calc non Af Amer: 82 mL/min — ABNORMAL LOW (ref >90)
GLUCOSE: 156 mg/dL — AB (ref 70–99)
Potassium: 4.1 mmol/L (ref 3.5–5.1)
SODIUM: 137 mmol/L (ref 135–145)

## 2014-06-21 LAB — MAGNESIUM: Magnesium: 1.9 mg/dL (ref 1.5–2.5)

## 2014-06-21 NOTE — Patient Instructions (Signed)
Doing great!  Will call with any abnormal lab results, otherwise no news is good news!  Follow up in 3 months with Dr. Johney FrameAllred.  Merry Christmas and Happy New Year!

## 2014-06-21 NOTE — Progress Notes (Signed)
Primary Care Physician: [name] ObeyKNOWLTON,STEPHEN D, MD Primary Cardiologist:Dr. Croitoru Primary Electrophysiologist:Dr. Allred Referring Physician:Dr. Croitoru   [name] OttoOtha B Yetta [name] is a 53 y.o. male with a h/o persitent atrial fibrillation who presents f/u hospitalization for tikosyn load..  The patient was initially diagnosed with atrial fibrillation in 2008 post op from  Bypass surgery. He took amiodarone for a short time after surgery but he returned to SR. He has noticed that over the years he would has spells of his heart racing that would be brief. Several weeks ago he started to feel poorly and was found to be in afib. after presenting with symptoms of  fatigue. He was started on xarelto and multaq but failed to maintain SR.Marland Kitchen.He was loaded on Tikosyn  November 20 and states feels much better in SR.    Today, he denies symptoms of palpitations, chest pain, shortness of breath, orthopnea, PND, lower extremity edema, , presyncope, syncope, snoring, daytime somnolence, bleeding, or neurologic sequela. Has noticed some dizziness since staring tikosyn. The patient is tolerating medications without difficulties and is otherwise without complaint today.    Atrial Fibrillation Risk Factors:  he does not have symptoms or diagnosis of sleep apnea.  he does not have a history of rheumatic fever.  he does not have a history of alcohol use.  he has a BMI of Body mass index is 33.93 kg/(m^2).Marland Kitchen. Filed Weights   06/21/14 1459  Weight: 250 lb 4 oz (113.513 kg)    LA size: 33.5 mm   Atrial Fibrillation Management history:  Previous antiarrhythmic drugs: multaq, remote use of amiodrone s/p bypass surgery.  Previous cardioversions: none  Previous ablations: noe  CHADS2VASC score: at least 3  Anticoagulation history: xarelto   Past Medical History  Diagnosis Date  . Heart disease   . Hypertension   . Diabetes   . High cholesterol   . Dysrhythmia   . Myocardial infarction   . Coronary  artery disease   . Edema   . H/O gastroesophageal reflux (GERD)   . Obesity   . Anemia     Postoperative  . Abdominal distention   . Pleural effusion     resolved with therapy   Past Surgical History  Procedure Laterality Date  . Coronary angioplasty    . Coronary artery bypass graft      X4  . Exercise cardiolite myocardial perfusion study    . Cardiac catheterization  2008    Current Outpatient Prescriptions  Medication Sig Dispense Refill  . ACCU-CHEK AVIVA PLUS test strip     . atorvastatin (LIPITOR) 40 MG tablet Take 40 mg by mouth daily at 6 PM. Generic only    . BYSTOLIC 10 MG tablet Take 5 mg by mouth every morning.     . dofetilide (TIKOSYN) 500 MCG capsule Take 1 capsule (500 mcg total) by mouth 2 (two) times daily. 60 capsule 6  . ferrous sulfate 325 (65 FE) MG tablet Take 325 mg by mouth 2 (two) times daily.    . furosemide (LASIX) 40 MG tablet Take 1-2 tablets by mouth daily as needed for fluid    . JANUVIA 100 MG tablet Take 100 mg by mouth daily.     . Lancets (ACCU-CHEK MULTICLIX) lancets     . LORazepam (ATIVAN) 0.5 MG tablet Take 0.5 mg by mouth at bedtime.     Marland Kitchen. losartan (COZAAR) 50 MG tablet Take 25 mg by mouth daily.     Marland Kitchen. MELATONIN PO Take 1  tablet by mouth at bedtime as needed.    . metFORMIN (GLUCOPHAGE) 1000 MG tablet Take 1,000 mg by mouth 2 (two) times daily with a meal.     . Multiple Vitamins-Minerals (MULTIVITAMIN WITH MINERALS) tablet Take 1 tablet by mouth 2 (two) times daily.     [name] Bicarbonate (ZEGERID OTC PO) Take 1 capsule by mouth daily.    . potassium chloride SA (K-DUR,KLOR-CON) 20 MEQ tablet Take 10 mEq by mouth daily.     [name] 20 MG TABS tablet Take 1 tablet by mouth daily.     No current facility-administered medications for this encounter.    No Known Allergies  History   Social History  . Marital Status: Married    Spouse Name: N/A    Number of Children: N/A  . Years of Education: N/A   Occupational  History  . Not on file.   Social History Main Topics  . Smoking status: Never Smoker   . Smokeless tobacco: Not on file  . Alcohol Use: 0.5 oz/week    1 drink(s) per week  . Drug Use: No  . Sexual Activity: Not on file   Other Topics Concern  . Not on file   Social History Narrative    No family history on file. The patient does not have a history of early familial atrial fibrillation or other arrhythmias.  ROS- All systems are reviewed and negative except as per the HPI above.  Physical Exam: Filed Vitals:   06/21/14 1459  BP: 144/82  Pulse: 85  Resp: 18  Weight: 250 lb 4 oz (113.513 kg)  SpO2: 99%    GEN- The patient is well appearing, alert and oriented x 3 today.   Head- normocephalic, atraumatic Eyes-  Sclera clear, conjunctiva pink Ears- hearing intact Oropharynx- clear Neck- supple, no JVP Lymph- no cervical lymphadenopathy Lungs- Clear to ausculation bilaterally, normal work of breathing Heart- Regular rate and rhythm, no murmurs, rubs or gallops, PMI not laterally displaced GI- soft, NT, ND, + BS Extremities- no clubbing, cyanosis, or edema MS- no significant deformity or atrophy Skin- no rash or lesion Psych- euthymic mood, full affect Neuro- strength and sensation are intact  EKG NSR with rightward axsis QTc .  Echo- Left ventricle: The cavity size was normal. Wall thickness was normal. Systolic function was normal. The estimated ejection fraction was in the range of 50% to 55%. - Mitral valve: There was mild regurgitation. - Left atrium: The atrium was mildly dilated.    Epic records are reviewed at length today  Assessment and Plan:  1. Atrial fibrillation The patient has Symptomatic  persistent atrial fibrillation.  The patients CHAD2VASC score is  at least 3.  he is  appropriately anticoagulated at this time. The patient is adequately rhythm controlled with Tikosyn.  Prior Antiarrhythmic therapy to dates has included multaq, brief  course of amiodarone s/p bypass.  The patients left atrial size is 33.5 mm.  Additional echo findings include Normal EF A long discussion with the patient was had today regarding therapeutic strategies.  Extensive discussion of lifestyle modification including attempt to lose weight .Marland Kitchen  Presently, our recommendations include  1.Persistent afib maintaing NSR on Tikosyn. Labs bmet and Mag to be drawn today.  2. Morbid obesity As above, lifestyle modification was discussed at length including regular exercise and weight reduction.  3. History of snoring Pt states he use to snore heavily but does not do so any longer, but does have a few possible  symptoms of sleep apnea. Will defer sleep study for now but may have to be considered for the future if pt does not respond to treatment as expected.  4.This patients CHA2DS2-VASc Score and unadjusted Ischemic Stroke Rate (% per year) is equal to 3.2 % stroke rate/year from a score of 3. Continue xarelto.  Above score calculated as 1 point each if present [CHF, HTN, DM, Vascular=MI/PAD/Aortic Plaque, Age if 65-74, or Male] Above score calculated as 2 points each if present [Age > 75, or Stroke/TIA/TE]  F/u with Dr. Johney FrameAllred in March and Dr. Royann Shiversroitoru as scheduled.    Co SignRudi Coco: CARROLL,DONNA, MD 06/21/2014 5:13 PM

## 2014-09-12 ENCOUNTER — Encounter: Payer: Self-pay | Admitting: Internal Medicine

## 2014-09-12 ENCOUNTER — Ambulatory Visit (INDEPENDENT_AMBULATORY_CARE_PROVIDER_SITE_OTHER): Payer: Federal, State, Local not specified - PPO | Admitting: Internal Medicine

## 2014-09-12 VITALS — BP 132/84 | HR 68 | Ht 72.0 in | Wt 253.6 lb

## 2014-09-12 DIAGNOSIS — I481 Persistent atrial fibrillation: Secondary | ICD-10-CM

## 2014-09-12 DIAGNOSIS — I10 Essential (primary) hypertension: Secondary | ICD-10-CM

## 2014-09-12 DIAGNOSIS — I4819 Other persistent atrial fibrillation: Secondary | ICD-10-CM

## 2014-09-12 NOTE — Progress Notes (Signed)
Electrophysiology Office Note   Date:  09/12/2014   ID:  [name], DOB 04/06/1961, MRN [id number]  PCP:  Milana Obey, MD  Cardiologist:  Dr Royann Shivers Primary Electrophysiologist: Hillis Range, MD    Chief Complaint  Patient presents with  . Follow-up    Persistent AFIB     History of Present Illness: [name] is a 54 y.o. male who presents today for electrophysiology evaluation.   He is doing very well with tikosyn.  Denies symptoms of afib.  BP is controlled.  Blood sugars have been recently elevated.  Today, he denies symptoms of palpitations, chest pain, shortness of breath, orthopnea, PND, lower extremity edema, claudication, dizziness, presyncope, syncope, bleeding, or neurologic sequela. The patient is tolerating medications without difficulties and is otherwise without complaint today.    Past Medical History  Diagnosis Date  . Persistent atrial fibrillation   . Hypertension   . Diabetes   . High cholesterol   . Myocardial infarction   . Coronary artery disease   . Edema   . H/O gastroesophageal reflux (GERD)   . Obesity   . Anemia     Postoperative  . Abdominal distention   . Pleural effusion     resolved with therapy   Past Surgical History  Procedure Laterality Date  . Coronary angioplasty    . Coronary artery bypass graft      X4  . Exercise cardiolite myocardial perfusion study    . Cardiac catheterization  2008     Current Outpatient Prescriptions  Medication Sig Dispense Refill  . ACCU-CHEK AVIVA PLUS test strip     . atorvastatin (LIPITOR) 40 MG tablet Take 40 mg by mouth daily at 6 PM. Generic only    . BYSTOLIC 10 MG tablet Take 5 mg by mouth every morning.     . dofetilide (TIKOSYN) 500 MCG capsule Take 1 capsule (500 mcg total) by mouth 2 (two) times daily. 60 capsule 6  . ferrous sulfate 325 (65 FE) MG tablet Take 325 mg by mouth 2 (two) times daily.    . furosemide (LASIX) 40 MG tablet Take 1-2 tablets by mouth daily as  needed for fluid    . JANUVIA 100 MG tablet Take 100 mg by mouth daily.     . Lancets (ACCU-CHEK MULTICLIX) lancets     . LORazepam (ATIVAN) 0.5 MG tablet Take 0.5 mg by mouth at bedtime.     Marland Kitchen losartan (COZAAR) 50 MG tablet Take 25 mg by mouth daily.     Marland Kitchen MELATONIN PO Take 4 tablets by mouth at bedtime as needed (sleep).     . metFORMIN (GLUCOPHAGE) 1000 MG tablet Take 1,000 mg by mouth 2 (two) times daily with a meal.     . Multiple Vitamins-Minerals (MULTIVITAMIN WITH MINERALS) tablet Take 1 tablet by mouth 2 (two) times daily.     [name] Bicarbonate (ZEGERID OTC PO) Take 1 capsule by mouth daily.    . potassium chloride SA (K-DUR,KLOR-CON) 20 MEQ tablet Take 10 mEq by mouth daily.     [name] 20 MG TABS tablet Take 1 tablet by mouth daily.     No current facility-administered medications for this visit.    Allergies:   Review of patient's allergies indicates no known allergies.   Social History:  The patient  reports that he has quit smoking. His smoking use included Cigarettes. He has a 60 pack-year smoking history. He does not have any smokeless tobacco history on  file. He reports that he drinks about 0.6 oz of alcohol per week. He reports that he does not use illicit drugs.    ROS:  Please see the history of present illness.  + poison Ivy,  All other systems are reviewed and negative.    PHYSICAL EXAM: VS:  BP 132/84 mmHg  Pulse 68  Ht 6' (1.829 m)  Wt 253 lb 9.6 oz (115.032 kg)  BMI 34.39 kg/m2 , BMI Body mass index is 34.39 kg/(m^2). GEN: Well nourished, well developed, in no acute distress HEENT: normal Neck: no JVD, carotid bruits, or masses Cardiac: RRR; no murmurs, rubs, or gallops,no edema  Respiratory:  clear to auscultation bilaterally, normal work of breathing GI: soft, nontender, nondistended, + BS MS: no deformity or atrophy Skin: warm and dry  Neuro:  Strength and sensation are intact Psych: euthymic mood, full affect  EKG:  EKG is ordered  today. The ekg ordered today shows sinus rhythm, Qtc 418   Recent Labs: 06/21/2014: BUN 19; Creatinine 1.03; Magnesium 1.9; Potassium 4.1; Sodium 137    Lipid Panel     Component Value Date/Time   CHOL  04/19/2007 0326    118        ATP III CLASSIFICATION:  <200     mg/dL   Desirable  [id number]  mg/dL   Borderline High  >=782>=240    mg/dL   High   TRIG 956169* 21/30/865710/19/2008 0326   HDL 21* 04/19/2007 0326   CHOLHDL 5.6 04/19/2007 0326   VLDL 34 04/19/2007 0326   LDLCALC  04/19/2007 0326    63        Total Cholesterol/HDL:CHD Risk Coronary Heart Disease Risk Table                     Men   Women  1/2 Average Risk   3.4   3.3     Wt Readings from Last 3 Encounters:  09/12/14 253 lb 9.6 oz (115.032 kg)  05/30/14 255 lb 8 oz (115.894 kg)  05/23/14 260 lb (117.935 kg)       ASSESSMENT AND PLAN:  1.  afib Doing well with tikosyn Appropriately anticoagulated No changes at this time  2. HTN Stable No change required today  3. Obesity Weight loss encouraged    Current medicines are reviewed at length with the patient today.   The patient does not have concerns regarding his medicines.  The following changes were made today:  none  Labs/ tests ordered today include:  Orders Placed This Encounter  Procedures  . EKG 12-Lead    Follow-up with Dr C as scheduled, follow-up with Rudi Cocoonna Carroll NP in the AF clinic.  I will see as needed going forward.   Randolm IdolSigned, Latrel Szymczak, MD  09/12/2014 9:49 AM     [facility] [address] [place] [address] [phone] (office) [phone] (fax)

## 2014-09-12 NOTE — Patient Instructions (Signed)
Your physician wants you to follow-up in: 6 months with Donna Carroll, NP. You will receive a reminder letter in the mail two months in advance. If you don't receive a letter, please call our office to schedule the follow-up appointment.  

## 2014-12-12 ENCOUNTER — Telehealth: Payer: Self-pay | Admitting: Cardiovascular Disease

## 2014-12-12 ENCOUNTER — Ambulatory Visit (INDEPENDENT_AMBULATORY_CARE_PROVIDER_SITE_OTHER): Payer: Federal, State, Local not specified - PPO | Admitting: Cardiovascular Disease

## 2014-12-12 ENCOUNTER — Encounter: Payer: Self-pay | Admitting: Cardiovascular Disease

## 2014-12-12 VITALS — BP 126/82 | HR 85 | Resp 16 | Ht 72.0 in | Wt 256.2 lb

## 2014-12-12 DIAGNOSIS — E1151 Type 2 diabetes mellitus with diabetic peripheral angiopathy without gangrene: Secondary | ICD-10-CM | POA: Diagnosis not present

## 2014-12-12 DIAGNOSIS — I481 Persistent atrial fibrillation: Secondary | ICD-10-CM | POA: Diagnosis not present

## 2014-12-12 DIAGNOSIS — I4819 Other persistent atrial fibrillation: Secondary | ICD-10-CM

## 2014-12-12 DIAGNOSIS — Z79899 Other long term (current) drug therapy: Secondary | ICD-10-CM

## 2014-12-12 DIAGNOSIS — E1159 Type 2 diabetes mellitus with other circulatory complications: Secondary | ICD-10-CM

## 2014-12-12 DIAGNOSIS — I10 Essential (primary) hypertension: Secondary | ICD-10-CM

## 2014-12-12 DIAGNOSIS — I2510 Atherosclerotic heart disease of native coronary artery without angina pectoris: Secondary | ICD-10-CM

## 2014-12-12 DIAGNOSIS — E669 Obesity, unspecified: Secondary | ICD-10-CM

## 2014-12-12 DIAGNOSIS — E782 Mixed hyperlipidemia: Secondary | ICD-10-CM

## 2014-12-12 MED ORDER — DOFETILIDE 500 MCG PO CAPS: 500.0000 ug | ORAL_CAPSULE | Freq: Two times a day (BID) | ORAL | Status: DC

## 2014-12-12 NOTE — Patient Instructions (Signed)
Medication Instructions:   Continue same medications.  Labwork:  2 months go to [facility] any day after the 15th of August.  You do not need an appointment.  Testing/Procedures:  None  Follow-Up:  6 Months

## 2014-12-12 NOTE — Progress Notes (Signed)
Patient ID: [name], male   DOB: September 21, 1960, 54 y.o.   MRN: [id number]     Cardiology Office Note   Date:  12/12/2014   ID:  [name], DOB 1960/11/18, MRN [id number]  PCP:  Milana Obey, MD  Cardiologist:   Thurmon Fair, MD ; Hillis Range, MD - EP  Chief Complaint  Patient presents with  . Follow-up    no chest discomfort or leg pain or swelling no other concerns today      History of Present Illness: [name] is a 54 y.o. male who presents for follow-up for coronary artery disease and history of problems with persistent atrial fibrillation.  He last saw Dr. Hillis Range in March and no adjustment was made to his treatment for atrial fibrillation. He thinks that since then he has had a couple of episodes of irregular rhythm that were very brief and were not associated with other worrisome symptoms. 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 She denies any cardiac complaints. He had normal renal function in December. He had more complete labs including a lipid profile performed in February with his primary care physician. He reports that his glucose control has been more challenging this year. He has decided to lose weight and eat a healthier diet.  He first presented with coronary disease at age 39 (bare-metal [phone] stent to proximal LAD in the setting of acute myocardial infarction) and had recurrent problems in 2007 (right coronary artery drug-eluting Taxus stents to the proximal RCA 3.0x12 and the mid PLA 2.5x16, followed by same admission placement of 2 drug-eluting Cypher 3.0x13 stents to the mid LAD). Bypass surgery was performed after repeat cardiac catheterization October 2008 showed 50% proximal LAD stent restenosis followed by aneurysmal dilatation and an 85% stenosis of the LAD involving the first diagonal branch as well as moderate lesions in the left circumflex and right coronary artery. He underwent four-vessel bypass  surgery in 2008. (Dr. Laneta Simmers, LIMA to LAD, SVG to first diagonal, SVG to first OM, SVG to PDA).  His most recent functional study was an echocardiogram in 2009 that showed normal overall left ventricular systolic function with mild anteroseptal hypokinesis and a mild to moderately dilated left atrium. He has not had a nuclear stress test since his bypass procedure.  He described classical angina pectoris and shortness of breath before his coronary events. (substernal pressure and burning, different from his reflux pain). Angina has not recurred since his bypass  He did have postoperative paroxysmal atrial fibrillation, recurred in 2014, recorded incidentally during a routine exam. He is aware of the palpitations and thinks that he does a little worse when he is in the arrhythmia. Rate control is excellent, due to chronic treatment with beta blockers. He does not have a history of stroke or TIA or other embolic events and does not have a known bleeding tendency or abnormal bleeding. CHADS2VASC score: at least 3. He failed Multaq therapy but has done well on Tikosyn.  Additional problems include type 2 diabetes mellitus, obesity, hypertension and gastroesophageal reflux disease. He has mixed hyperlipidemia. He is a former smoker. He may have a history of asbestos exposure when he worked in the [employer] yard. He continues to have 2 full-time jobs. 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    Past Medical History  Diagnosis Date  . Persistent atrial fibrillation   . Hypertension   . Diabetes   .  High cholesterol   . Myocardial infarction   . Coronary artery disease   . Edema   . H/O gastroesophageal reflux (GERD)   . Obesity   . Anemia     Postoperative  . Abdominal distention   . Pleural effusion     resolved with therapy    Past Surgical History  Procedure Laterality Date  . Coronary angioplasty    . Coronary artery bypass graft      X4  . Exercise cardiolite  myocardial perfusion study    . Cardiac catheterization  2008     Current Outpatient Prescriptions  Medication Sig Dispense Refill  . ACCU-CHEK AVIVA PLUS test strip     . atorvastatin (LIPITOR) 40 MG tablet Take 40 mg by mouth daily at 6 PM. Generic only    . BYSTOLIC 10 MG tablet Take 5 mg by mouth every morning.     . dofetilide (TIKOSYN) 500 MCG capsule Take 1 capsule (500 mcg total) by mouth 2 (two) times daily. 60 capsule 6  . ferrous sulfate 325 (65 FE) MG tablet Take 325 mg by mouth 2 (two) times daily.    . furosemide (LASIX) 40 MG tablet Take 1-2 tablets by mouth daily as needed for fluid    . JANUVIA 100 MG tablet Take 100 mg by mouth daily.     . Lancets (ACCU-CHEK MULTICLIX) lancets     . LORazepam (ATIVAN) 0.5 MG tablet Take 0.5 mg by mouth at bedtime.     Marland Kitchen losartan (COZAAR) 50 MG tablet Take 25 mg by mouth daily.     Marland Kitchen MELATONIN PO Take 4 tablets by mouth at bedtime as needed (sleep).     . metFORMIN (GLUCOPHAGE) 1000 MG tablet Take 1,000 mg by mouth 2 (two) times daily with a meal.     . Multiple Vitamins-Minerals (MULTIVITAMIN WITH MINERALS) tablet Take 1 tablet by mouth 2 (two) times daily.     [name] Bicarbonate (ZEGERID OTC PO) Take 1 capsule by mouth daily.    . potassium chloride SA (K-DUR,KLOR-CON) 20 MEQ tablet Take 10 mEq by mouth daily.     [name] 20 MG TABS tablet Take 1 tablet by mouth daily.     No current facility-administered medications for this visit.    Allergies:   Review of patient's allergies indicates no known allergies.    Social History:  The patient  reports that he has quit smoking. His smoking use included Cigarettes. He has a 60 pack-year smoking history. He does not have any smokeless tobacco history on file. He reports that he drinks about 0.6 oz of alcohol per week. He reports that he does not use illicit drugs.     ROS:  Please see the history of present illness.    Otherwise, review of systems positive for none.     All other systems are reviewed and negative.    PHYSICAL EXAM: VS:  BP 126/82 mmHg  Pulse 85  Resp 16  Ht 6' (1.829 m)  Wt 116.212 kg (256 lb 3.2 oz)  BMI 34.74 kg/m2 , BMI Body mass index is 34.74 kg/(m^2).  General: Alert, oriented x3, no distress Head: no evidence of trauma, PERRL, EOMI, no exophtalmos or lid lag, no myxedema, no xanthelasma; normal ears, nose and oropharynx Neck: normal jugular venous pulsations and no hepatojugular reflux; brisk carotid pulses without delay and no carotid bruits Chest: clear to auscultation, no signs of consolidation by percussion or palpation, normal fremitus, symmetrical and full respiratory  excursions Cardiovascular: normal position and quality of the apical impulse, regular rhythm, normal first and second heart sounds, no murmurs, rubs or gallops, sternotomy scar Abdomen: no tenderness or distention, no masses by palpation, no abnormal pulsatility or arterial bruits, normal bowel sounds, no hepatosplenomegaly Extremities: no clubbing, cyanosis or edema; 2+ radial, ulnar and brachial pulses bilaterally; 2+ right femoral, posterior tibial and dorsalis pedis pulses; 2+ left femoral, posterior tibial and dorsalis pedis pulses; no subclavian or femoral bruits Neurological: grossly nonfocal Psych: euthymic mood, full affect   EKG:  EKG is ordered today. The ekg ordered today demonstrates normal sinus rhythm, corrected QT interval 437 ms   Recent Labs: 06/21/2014: BUN 19; Creatinine, Ser 1.03; Magnesium 1.9; Potassium 4.1; Sodium 137    Wt Readings from Last 3 Encounters:  12/12/14 116.212 kg (256 lb 3.2 oz)  09/12/14 115.032 kg (253 lb 9.6 oz)  06/21/14 113.513 kg (250 lb 4 oz)    ASSESSMENT AND PLAN:  Persistent atrial fibrillation On anticoagulation without embolic or bleeding events. Doing well on Tikosyn, which has led to symptomatic improvement. Reviewed the risk of drug interactions and the need to supplement potassium when taking  extra doses of diuretic. Repeat labs at least every 6 months.  Mixed hyperlipidemia His PCP has performed a recent lipid profile. Will retrieve results. Discussed the importance of weight loss to help with insulin resistance/diabetes, reduce the likelihood of recurrent coronary problems and reduce the overall prevalence of atrial fibrillation.  CAD (coronary artery disease) He had typical exertional angina before all of his previous coronary events. He has no anginal complaints at this time.   Current medicines are reviewed at length with the patient today.  The patient has concerns regarding medicines. He wonders whether there may be an interaction that is worsening glycemic control. I cannot identify any.  The following changes have been made:  no change  Labs/ tests ordered today include:  Orders Placed This Encounter  Procedures  . EKG 12-Lead   Patient Instructions  Medication Instructions:   Continue same medications.  Labwork:  2 months go to [facility] any day after the 15th of August.  You do not need an appointment.  Testing/Procedures:  None  Follow-Up:  6 Months        Signed, Thurmon Fair, MD  12/12/2014 8:32 AM    Thurmon Fair, MD, [facility] [phone] office [phone] pager

## 2014-12-12 NOTE — Telephone Encounter (Signed)
His Tikosyn was sent to the wrong pharmacy. Please resend to [facility] [phone] #[phone]

## 2014-12-12 NOTE — Telephone Encounter (Signed)
Rx refilled to [facility].  

## 2014-12-14 ENCOUNTER — Encounter: Payer: Self-pay | Admitting: Cardiovascular Disease

## 2015-03-01 ENCOUNTER — Telehealth: Payer: Self-pay | Admitting: Cardiovascular Disease

## 2015-03-01 DIAGNOSIS — Z79899 Other long term (current) drug therapy: Secondary | ICD-10-CM

## 2015-03-01 LAB — BASIC METABOLIC PANEL
BUN: 17 mg/dL (ref 7–25)
CALCIUM: 9.8 mg/dL (ref 8.6–10.3)
CO2: 26 mmol/L (ref 20–31)
Chloride: 102 mmol/L (ref 98–110)
Creat: 0.88 mg/dL (ref 0.70–1.33)
GLUCOSE: 200 mg/dL — AB (ref 65–99)
POTASSIUM: 4.4 mmol/L (ref 3.5–5.3)
SODIUM: 136 mmol/L (ref 135–146)

## 2015-03-01 LAB — MAGNESIUM: MAGNESIUM: 1.8 mg/dL (ref 1.5–2.5)

## 2015-03-01 NOTE — Telephone Encounter (Signed)
Selma called in stating that she needs some new orders for the pt's Magnesium labs and Basic Metabolic panel sent through so she can send the pt's labs off. She has already drawn the blood  Thanks

## 2015-03-01 NOTE — Telephone Encounter (Signed)
Released pending future orders, informed caller from [facility] - gave number to call back if any further concerns.

## 2015-06-12 ENCOUNTER — Ambulatory Visit: Payer: Federal, State, Local not specified - PPO | Admitting: Cardiovascular Disease

## 2015-06-19 ENCOUNTER — Ambulatory Visit (INDEPENDENT_AMBULATORY_CARE_PROVIDER_SITE_OTHER): Payer: Federal, State, Local not specified - PPO | Admitting: Cardiovascular Disease

## 2015-06-19 ENCOUNTER — Encounter: Payer: Self-pay | Admitting: Cardiovascular Disease

## 2015-06-19 VITALS — BP 136/88 | HR 76 | Resp 16 | Ht 72.0 in | Wt 249.2 lb

## 2015-06-19 DIAGNOSIS — I481 Persistent atrial fibrillation: Secondary | ICD-10-CM | POA: Diagnosis not present

## 2015-06-19 DIAGNOSIS — E782 Mixed hyperlipidemia: Secondary | ICD-10-CM

## 2015-06-19 DIAGNOSIS — I10 Essential (primary) hypertension: Secondary | ICD-10-CM

## 2015-06-19 DIAGNOSIS — I4819 Other persistent atrial fibrillation: Secondary | ICD-10-CM

## 2015-06-19 DIAGNOSIS — E1159 Type 2 diabetes mellitus with other circulatory complications: Secondary | ICD-10-CM | POA: Diagnosis not present

## 2015-06-19 DIAGNOSIS — I2510 Atherosclerotic heart disease of native coronary artery without angina pectoris: Secondary | ICD-10-CM

## 2015-06-19 DIAGNOSIS — I5032 Chronic diastolic (congestive) heart failure: Secondary | ICD-10-CM

## 2015-06-19 DIAGNOSIS — E669 Obesity, unspecified: Secondary | ICD-10-CM

## 2015-06-19 DIAGNOSIS — I5042 Chronic combined systolic (congestive) and diastolic (congestive) heart failure: Secondary | ICD-10-CM | POA: Insufficient documentation

## 2015-06-19 MED ORDER — DOFETILIDE 500 MCG PO CAPS: 500.0000 ug | ORAL_CAPSULE | Freq: Two times a day (BID) | ORAL | Status: DC

## 2015-06-19 NOTE — Patient Instructions (Signed)
Your physician recommends that you schedule a follow-up appointment in: MARCH 2017 WITH DR. ALLRED  Dr. Royann Shiversroitoru recommends that you schedule a follow-up appointment in: 6 MONTHS

## 2015-06-19 NOTE — Progress Notes (Signed)
Patient ID: [name], male   DOB: 07/21/1960, 54 y.o.   MRN: [id number]     Cardiology Office Note   Date:  06/19/2015   ID:  BARY LIMBACH, DOB 11-04-1960, MRN [id number]  PCP:  Milana Obey, MD  Cardiologist:   Thurmon Fair, MD   Chief Complaint  Patient presents with  . Follow-up    no chest pain, occassional shortness of breath, body holds fluid, no pain or cramping in legs-has nerve pain, no lightheaded or dizziness      History of Present Illness: [name] is a 54 y.o. male who presents for CAD, atrial fibrillation  He feels well, continues to work hard. He has occasional gingival bleeding and one episode of epistaxis. Reports a couple of episodes of nocturnal palpitations, without other associated symptoms. Denies angina.   He has been trying to eat healthy and has a deli [place] sandwich daily. He has had ankle swelling and occasional weight gain abruptly, even 8-10 lb, sometimes with mild dyspnea. He was unaware of the big salt load in luncheon meats.  He had labs checked by Dr. Sudie Bailey and reports that his hemoglobin A1c has recently become more difficult to control that his cholesterol levels were excellent. I don't have those results. He is trying to cut back on his intake of starches such as french fries.  He first presented with coronary disease at age 78 (bare-metal [id number] stent to proximal LAD in the setting of acute myocardial infarction) and had recurrent problems in 2007 (right coronary artery drug-eluting Taxus stents to the proximal RCA 3.0x12 and the mid PLA 2.5x16, followed by same admission placement of 2 drug-eluting Cypher 3.0x13 stents to the mid LAD). Bypass surgery was performed after repeat cardiac catheterization October 2008 showed 50% proximal LAD stent restenosis followed by aneurysmal dilatation and an 85% stenosis of the LAD involving the first diagonal branch as well as moderate lesions in the left circumflex and right coronary artery. He  underwent four-vessel bypass surgery in 2008. (Dr. Laneta Simmers, LIMA to LAD, SVG to first diagonal, SVG to first OM, SVG to PDA).  His most recent functional study was an echocardiogram in 2015 that showed normal overall left ventricular systolic function and a mildly dilated left atrium. He has not had a nuclear stress test since his bypass procedure.  He described classical angina pectoris and shortness of breath before his coronary events. (substernal pressure and burning, different from his reflux pain). Angina has not recurred since his bypass   He did have postoperative paroxysmal atrial fibrillation, recurred in 2014, recorded incidentally during a routine exam. He is aware of the palpitations and thinks that he does a little worse when he is in the arrhythmia. Rate control is excellent, due to chronic treatment with beta blockers. He does not have a history of stroke or TIA or other embolic events and does not have a known bleeding tendency or abnormal bleeding. CHADS2VASC score: at least 3. He failed Multaq therapy but has done well on Tikosyn.  Additional problems include type 2 diabetes mellitus, obesity, hypertension and gastroesophageal reflux disease. He has mixed hyperlipidemia. He is a former smoker. He may have a history of asbestos exposure when he worked in the [employer] yard. He continues to have 2 full-time jobs. 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    Past Medical History  Diagnosis Date  . Persistent atrial fibrillation (HCC)   . Hypertension   . Diabetes (HCC)   .  High cholesterol   . Myocardial infarction (HCC)   . Coronary artery disease   . Edema   . H/O gastroesophageal reflux (GERD)   . Obesity   . Anemia     Postoperative  . Abdominal distention   . Pleural effusion     resolved with therapy    Past Surgical History  Procedure Laterality Date  . Coronary angioplasty    . Coronary artery bypass graft      X4  . Exercise cardiolite  myocardial perfusion study    . Cardiac catheterization  2008     Current Outpatient Prescriptions  Medication Sig Dispense Refill  . ACCU-CHEK AVIVA PLUS test strip     . atorvastatin (LIPITOR) 40 MG tablet Take 40 mg by mouth daily at 6 PM. Generic only    . BYSTOLIC 10 MG tablet Take 5 mg by mouth every morning.     . dofetilide (TIKOSYN) 500 MCG capsule Take 1 capsule (500 mcg total) by mouth 2 (two) times daily. 60 capsule 6  . ferrous sulfate 325 (65 FE) MG tablet Take 325 mg by mouth 2 (two) times daily.    . furosemide (LASIX) 40 MG tablet Take 1-2 tablets by mouth daily as needed for fluid    . JANUVIA 100 MG tablet Take 100 mg by mouth daily.     . Lancets (ACCU-CHEK MULTICLIX) lancets     . LORazepam (ATIVAN) 0.5 MG tablet Take 0.5 mg by mouth at bedtime.     Marland Kitchen. losartan (COZAAR) 50 MG tablet Take 25 mg by mouth daily.     Marland Kitchen. MELATONIN PO Take 4 tablets by mouth at bedtime as needed (sleep).     . metFORMIN (GLUCOPHAGE) 1000 MG tablet Take 1,000 mg by mouth 2 (two) times daily with a meal.     . Multiple Vitamins-Minerals (MULTIVITAMIN WITH MINERALS) tablet Take 1 tablet by mouth 2 (two) times daily.     [name]. Omeprazole-Sodium Bicarbonate (ZEGERID OTC PO) Take 1 capsule by mouth daily.    . potassium chloride SA (K-DUR,KLOR-CON) 20 MEQ tablet Take 10 mEq by mouth daily.     [name]. XARELTO 20 MG TABS tablet Take 1 tablet by mouth daily.     No current facility-administered medications for this visit.    Allergies:   Review of patient's allergies indicates no known allergies.    Social History:  The patient  reports that he has quit smoking. His smoking use included Cigarettes. He has a 60 pack-year smoking history. He does not have any smokeless tobacco history on file. He reports that he drinks about 0.6 oz of alcohol per week. He reports that he does not use illicit drugs.   Family History:  The patient's  Family history is significant forpremature CAD  ROS:  Please see the  history of present illness.    Otherwise, review of systems positive for none.   All other systems are reviewed and negative.    PHYSICAL EXAM: VS:  BP 136/88 mmHg  Pulse 76  Resp 16  Ht 6' (1.829 m)  Wt 249 lb 4 oz (113.059 kg)  BMI 33.80 kg/m2 , BMI Body mass index is 33.8 kg/(m^2).  General: Alert, oriented x3, no distress Head: no evidence of trauma, PERRL, EOMI, no exophtalmos or lid lag, no myxedema, no xanthelasma; normal ears, nose and oropharynx Neck: normal jugular venous pulsations and no hepatojugular reflux; brisk carotid pulses without delay and no carotid bruits Chest: clear to auscultation, no signs of  consolidation by percussion or palpation, normal fremitus, symmetrical and full respiratory excursions Cardiovascular: normal position and quality of the apical impulse, regular rhythm, normal first and second heart sounds, no murmurs, rubs or gallops Abdomen: no tenderness or distention, no masses by palpation, no abnormal pulsatility or arterial bruits, normal bowel sounds, no hepatosplenomegaly Extremities: no clubbing, cyanosis or edema; 2+ radial, ulnar and brachial pulses bilaterally; 2+ right femoral, posterior tibial and dorsalis pedis pulses; 2+ left femoral, posterior tibial and dorsalis pedis pulses; no subclavian or femoral bruits Neurological: grossly nonfocal Psych: euthymic mood, full affect   EKG:  EKG is ordered today. The ekg ordered today demonstrates  Normal sinus rhythm with rightward axis and no evidence of significant repolarization abnormalities. QTC 402 ms   Recent Labs: 03/01/2015: BUN 17; Creat 0.88; Magnesium 1.8; Potassium 4.4; Sodium 136    Lipid Panel    Component Value Date/Time   CHOL  04/19/2007 0326    118        ATP III CLASSIFICATION:  <200     mg/dL   Desirable  161-096  mg/dL   Borderline High  >=045    mg/dL   High   TRIG 409* 81/19/1478 0326   HDL 21* 04/19/2007 0326   CHOLHDL 5.6 04/19/2007 0326   VLDL 34 04/19/2007  0326   LDLCALC  04/19/2007 0326    63        Total Cholesterol/HDL:CHD Risk Coronary Heart Disease Risk Table                     Men   Women  1/2 Average Risk   3.4   3.3      Wt Readings from Last 3 Encounters:  06/19/15 249 lb 4 oz (113.059 kg)  12/12/14 256 lb 3.2 oz (116.212 kg)  09/12/14 253 lb 9.6 oz (115.032 kg)    .   ASSESSMENT AND PLAN:  1. Persistent atrial fibrillation (HCC)   2. Essential hypertension   3. DM type 2 causing vascular disease (HCC)   4. Coronary artery disease involving native coronary artery of native heart without angina pectoris   5. Obesity (BMI 30-39.9)   6. Mixed hyperlipidemia   7. Chronic diastolic heart failure (HCC)    Persistent atrial fibrillation On anticoagulation without embolic or bleeding events. Doing well on Tikosyn, which has led to symptomatic improvement. Reviewed the risk of drug interactions and the need to supplement potassium when taking extra doses of diuretic. Repeat labs at least every 6 months.  Will be due a yearly visit with Dr. Johney Frame in March.  Mixed hyperlipidemia His PCP has performed a recent lipid profile. Will retrieve results. Discussed the importance of weight loss to help with insulin resistance/diabetes, reduce the likelihood of recurrent coronary problems and reduce the overall prevalence of atrial fibrillation. Weight loss will be helpful in management of diabetes, atrial fibrillation and vascular disease.  CAD (coronary artery disease) He had typical exertional angina before all of his previous coronary events. He has no anginal complaints at this time.   Chronic diastolic heart failure Reviewed the high sodium content of his current diet. Discussed the need to reduce sodium , avoid daily needs, preserved foods. Try grilled chicken sandwich,  Egg whites, tuna , cottage cheese and other high protein foods low in saturated fat instead.  Also discussed the fact that he can double his dose of furosemide and  potassium supplement temporarily. Dyspnea to be done judiciously and he should ask for  advice if he needs to do it frequently due to the risk of hypokalemia associated arrhythmia while on take [place] in.   Current medicines are reviewed at length with the patient today.  The patient does not have concerns regarding medicines.  The following changes have been made:  no change  Labs/ tests ordered today include:  Orders Placed This Encounter  Procedures  . EKG 12-Lead    Patient Instructions  Your physician recommends that you schedule a follow-up appointment in: MARCH 2017 WITH DR. ALLRED  Dr. Royann Shivers recommends that you schedule a follow-up appointment in: 6 MONTHS        Signed, Jerran Tappan, MD  06/19/2015 8:27 AM    Thurmon Fair, MD, [facility] [phone] office [phone] pager

## 2015-09-11 ENCOUNTER — Encounter: Payer: Self-pay | Admitting: Internal Medicine

## 2015-09-11 ENCOUNTER — Ambulatory Visit (INDEPENDENT_AMBULATORY_CARE_PROVIDER_SITE_OTHER): Payer: Federal, State, Local not specified - PPO | Admitting: Internal Medicine

## 2015-09-11 VITALS — BP 124/80 | HR 81 | Ht 72.0 in | Wt 252.6 lb

## 2015-09-11 DIAGNOSIS — I481 Persistent atrial fibrillation: Secondary | ICD-10-CM | POA: Diagnosis not present

## 2015-09-11 DIAGNOSIS — I10 Essential (primary) hypertension: Secondary | ICD-10-CM | POA: Diagnosis not present

## 2015-09-11 DIAGNOSIS — Z862 Personal history of diseases of the blood and blood-forming organs and certain disorders involving the immune mechanism: Secondary | ICD-10-CM

## 2015-09-11 DIAGNOSIS — I4819 Other persistent atrial fibrillation: Secondary | ICD-10-CM

## 2015-09-11 LAB — CBC WITH DIFFERENTIAL/PLATELET
BASOS ABS: 0 10*3/uL (ref 0.0–0.1)
Basophils Relative: 0 % (ref 0–1)
EOS PCT: 1 % (ref 0–5)
Eosinophils Absolute: 0.1 10*3/uL (ref 0.0–0.7)
HEMATOCRIT: 38.9 % — AB (ref 39.0–52.0)
Hemoglobin: 13.1 g/dL (ref 13.0–17.0)
LYMPHS ABS: 2.9 10*3/uL (ref 0.7–4.0)
LYMPHS PCT: 29 % (ref 12–46)
MCH: 29.2 pg (ref 26.0–34.0)
MCHC: 33.7 g/dL (ref 30.0–36.0)
MCV: 86.8 fL (ref 78.0–100.0)
MONO ABS: 0.8 10*3/uL (ref 0.1–1.0)
MPV: 9.9 fL (ref 8.6–12.4)
Monocytes Relative: 8 % (ref 3–12)
Neutro Abs: 6.1 10*3/uL (ref 1.7–7.7)
Neutrophils Relative %: 62 % (ref 43–77)
Platelets: 227 10*3/uL (ref 150–400)
RBC: 4.48 MIL/uL (ref 4.22–5.81)
RDW: 14.2 % (ref 11.5–15.5)
WBC: 9.9 10*3/uL (ref 4.0–10.5)

## 2015-09-11 LAB — BASIC METABOLIC PANEL
BUN: 20 mg/dL (ref 7–25)
CHLORIDE: 100 mmol/L (ref 98–110)
CO2: 25 mmol/L (ref 20–31)
Calcium: 9.2 mg/dL (ref 8.6–10.3)
Creat: 0.74 mg/dL (ref 0.70–1.33)
GLUCOSE: 165 mg/dL — AB (ref 65–99)
POTASSIUM: 4.4 mmol/L (ref 3.5–5.3)
Sodium: 134 mmol/L — ABNORMAL LOW (ref 135–146)

## 2015-09-11 LAB — MAGNESIUM: Magnesium: 1.7 mg/dL (ref 1.5–2.5)

## 2015-09-11 NOTE — Progress Notes (Signed)
Electrophysiology Office Note   Date:  09/11/2015   ID:  [name], DOB [birth year], MRN [id number]  PCP:  Milana ObeyKNOWLTON,STEPHEN D, MD  Cardiologist:  Dr Royann Shiversroitoru Primary Electrophysiologist: Hillis RangeJames Angeliki Mates, MD    Chief Complaint  Patient presents with  . Atrial Fibrillation  . Edema    edema in right leg     History of Present Illness: [name] KocherOtha B Tortorelli is a 55 y.o. male who presents today for electrophysiology evaluation.   He is doing very well with tikosyn.  Rare afib is self limited and typically at night. Today, he denies symptoms of chest pain, shortness of breath, orthopnea, PND, lower extremity edema, claudication, dizziness, presyncope, syncope, bleeding, or neurologic sequela. The patient is tolerating medications without difficulties and is otherwise without complaint today.    Past Medical History  Diagnosis Date  . Persistent atrial fibrillation (HCC)   . Hypertension   . Diabetes (HCC)   . High cholesterol   . Myocardial infarction (HCC)   . Coronary artery disease   . Edema   . H/O gastroesophageal reflux (GERD)   . Obesity   . Anemia     Postoperative  . Abdominal distention   . Pleural effusion     resolved with therapy   Past Surgical History  Procedure Laterality Date  . Coronary angioplasty    . Coronary artery bypass graft      X4  . Exercise cardiolite myocardial perfusion study    . Cardiac catheterization  2008     Current Outpatient Prescriptions  Medication Sig Dispense Refill  . ACCU-CHEK AVIVA PLUS test strip     . atorvastatin (LIPITOR) 40 MG tablet Take 40 mg by mouth daily at 6 PM. Generic only    . BYSTOLIC 10 MG tablet Take 5 mg by mouth every morning.     . dofetilide (TIKOSYN) 500 MCG capsule Take 1 capsule (500 mcg total) by mouth 2 (two) times daily. 60 capsule 6  . ferrous sulfate 325 (65 FE) MG tablet Take 325 mg by mouth 2 (two) times daily.    . furosemide (LASIX) 40 MG tablet Take 1-2 tablets by mouth daily as needed for  fluid    . HYDROcodone-acetaminophen (NORCO) 10-325 MG tablet Take 1 tablet by mouth as directed.    Marland Kitchen. JANUVIA 100 MG tablet Take 100 mg by mouth daily.     . Lancets (ACCU-CHEK MULTICLIX) lancets     . losartan (COZAAR) 50 MG tablet Take 25 mg by mouth daily.     . metFORMIN (GLUCOPHAGE) 1000 MG tablet Take 1,000 mg by mouth 2 (two) times daily with a meal.     . Multiple Vitamins-Minerals (MULTIVITAMIN WITH MINERALS) tablet Take 1 tablet by mouth 2 (two) times daily.     [name]. Omeprazole-Sodium Bicarbonate (ZEGERID OTC PO) Take 1 capsule by mouth daily.    . potassium chloride SA (K-DUR,KLOR-CON) 20 MEQ tablet Take 10 mEq by mouth daily.     [name]. XARELTO 20 MG TABS tablet Take 1 tablet by mouth daily.     No current facility-administered medications for this visit.    Allergies:   Review of patient's allergies indicates no known allergies.   Social History:  The patient  reports that he has quit smoking. His smoking use included Cigarettes. He has a 60 pack-year smoking history. He does not have any smokeless tobacco history on file. He reports that he drinks about 0.6 oz of alcohol per week. He reports that he  does not use illicit drugs.    ROS:  Please see the history of present illness.  + poison Ivy,  All other systems are reviewed and negative.    PHYSICAL EXAM: VS:  BP 124/80 mmHg  Pulse 81  Ht 6' (1.829 m)  Wt 252 lb 9.6 oz (114.579 kg)  BMI 34.25 kg/m2 , BMI Body mass index is 34.25 kg/(m^2). GEN: Well nourished, well developed, in no acute distress HEENT: normal Neck: no JVD, carotid bruits, or masses Cardiac: RRR; no murmurs, rubs, or gallops,no edema  Respiratory:  clear to auscultation bilaterally, normal work of breathing GI: soft, nontender, nondistended, + BS MS: no deformity or atrophy Skin: warm and dry  Neuro:  Strength and sensation are intact Psych: euthymic mood, full affect  EKG:  EKG is ordered today. The ekg ordered today shows sinus rhythm, Qtc  427   Recent Labs: 03/01/2015: BUN 17; Creat 0.88; Magnesium 1.8; Potassium 4.4; Sodium 136    Lipid Panel     Component Value Date/Time   CHOL  04/19/2007 0326    118        ATP III CLASSIFICATION:  <200     mg/dL   Desirable  161-096  mg/dL   Borderline High  >=045    mg/dL   High   TRIG 409* 81/19/1478 0326   HDL 21* 04/19/2007 0326   CHOLHDL 5.6 04/19/2007 0326   VLDL 34 04/19/2007 0326   LDLCALC  04/19/2007 0326    63        Total Cholesterol/HDL:CHD Risk Coronary Heart Disease Risk Table                     Men   Women  1/2 Average Risk   3.4   3.3     Wt Readings from Last 3 Encounters:  09/11/15 252 lb 9.6 oz (114.579 kg)  06/19/15 249 lb 4 oz (113.059 kg)  12/12/14 256 lb 3.2 oz (116.212 kg)       ASSESSMENT AND PLAN:  1.  afib Doing well with tikosyn Appropriately anticoagulated No changes at this time Bmet, mg today  2. HTN Stable No change required today  3. Obesity Weight loss encouraged  4. Anemia Will check cbc today Reduce ferrous sulfate to  daily (has been on for years without discussion of reducing) I have instructed him to follow-up with PCP for any further adjustment/ reduction.  Current medicines are reviewed at length with the patient today.   The patient does not have concerns regarding his medicines.  The following changes were made today:  none  Labs/ tests ordered today include:  No orders of the defined types were placed in this encounter.    Follow-up with Dr C as scheduled, follow-up with me in 1 year   Signed, Hillis Range, MD  09/11/2015 10:53 AM     [facility] [address] [place] [address] [phone] (office) [phone] (fax)

## 2015-09-11 NOTE — Patient Instructions (Signed)
Medication Instructions:  Your physician has recommended you make the following change in your medication:  1) Decrease Ferritin to 325 daily   Labwork: Your physician recommends that you return for lab work today: Mag/BMP/CBC   Testing/Procedures: None ordered   Follow-Up: Your physician wants you to follow-up in: 12 months with Dr Johney FrameAllred Bonita QuinYou will receive a reminder letter in the mail two months in advance. If you don't receive a letter, please call our office to schedule the follow-up appointment.   Any Other Special Instructions Will Be Listed Below (If Applicable).     If you need a refill on your cardiac medications before your next appointment, please call your pharmacy.

## 2016-01-26 NOTE — Progress Notes (Signed)
Patient ID: [name], male   DOB: 03-Dec-1960, 55 y.o.   MRN: [id number]     Cardiology Office Note   Date:  02/05/2016   ID:  [name], DOB November 29, 1960, MRN [id number]  PCP:  Milana Obey, MD  Cardiologist:   Thurmon Fair, MD   Chief Complaint  Patient presents with  . Follow-up    pt c/o SOB; mild swelling in right leg; no other Sx.      History of Present Illness: [name] is a 55 y.o. male who presents for CAD, atrial fibrillation  He feels well, continues to work hard. He has occasional gingival bleeding. Reports a couple of episodes of nocturnal palpitations, usually lasting for about an hour,without other associated symptoms. Denies angina. He is in atrial fibrillation today and is vaguely aware of the irregularity of his heart beat, but does not feel in any way slowed down by  He had labs checked by Dr. Sudie Bailey and reports that his cholesterol was excellent and that his hemoglobin A1c has improved after starting glimepiride.   He inquires about the safety of allergy medications.  He first presented with coronary disease at age 55 (bare-metal [id number] stent to proximal LAD in the setting of acute myocardial infarction) and had recurrent problems in 2007 (right coronary artery drug-eluting Taxus stents to the proximal RCA 3.0x12 and the mid PLA 2.5x16, followed by same admission placement of 2 drug-eluting Cypher 3.0x13 stents to the mid LAD). Bypass surgery was performed after repeat cardiac catheterization October 2008 showed 50% proximal LAD stent restenosis followed by aneurysmal dilatation and an 85% stenosis of the LAD involving the first diagonal branch as well as moderate lesions in the left circumflex and right coronary artery. He underwent four-vessel bypass surgery in 2008. (Dr. Laneta Simmers, LIMA to LAD, SVG to first diagonal, SVG to first OM, SVG to PDA).  His most recent functional study was an echocardiogram in 2015 that showed normal overall left  ventricular systolic function and a mildly dilated left atrium. He has not had a nuclear stress test since his bypass procedure.  He described classical angina pectoris and shortness of breath before his coronary events. (substernal pressure and burning, different from his reflux pain). Angina has not recurred since his bypass   He did have postoperative paroxysmal atrial fibrillation, recurred in 2014, recorded incidentally during a routine exam. He is aware of the palpitations and thinks that he does a little worse when he is in the arrhythmia. Rate control is excellent, due to chronic treatment with beta blockers. He does not have a history of stroke or TIA or other embolic events and does not have a known bleeding tendency or abnormal bleeding. CHADS2VASC score: at least 3. He failed Multaq therapy but has done well on Tikosyn.  Additional problems include type 2 diabetes mellitus, obesity, hypertension and gastroesophageal reflux disease. He has mixed hyperlipidemia. He is a former smoker. He may have a history of asbestos exposure when he worked in the [employer] yard. He continues to have 2 full-time jobs. 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    Past Medical History:  Diagnosis Date  . Abdominal distention   . Anemia    Postoperative  . Coronary artery disease   . Diabetes (HCC)   . Edema   . H/O gastroesophageal reflux (GERD)   . High cholesterol   . Hypertension   . Myocardial infarction (HCC)   . Obesity   .  Persistent atrial fibrillation (HCC)   . Pleural effusion    resolved with therapy    Past Surgical History:  Procedure Laterality Date  . CARDIAC CATHETERIZATION  2008  . CORONARY ANGIOPLASTY    . CORONARY ARTERY BYPASS GRAFT     X4  . exercise cardiolite myocardial perfusion study       Current Outpatient Prescriptions  Medication Sig Dispense Refill  . ACCU-CHEK AVIVA PLUS test strip     . atorvastatin (LIPITOR) 40 MG tablet Take 40 mg  by mouth daily at 6 PM. Generic only    . BYSTOLIC 10 MG tablet Take 5 mg by mouth every morning.     . dofetilide (TIKOSYN) 500 MCG capsule Take 1 capsule (500 mcg total) by mouth 2 (two) times daily. 60 capsule 6  . ferrous sulfate 325 (65 FE) MG tablet Take 325 mg by mouth daily.    . furosemide (LASIX) 40 MG tablet Take 1-2 tablets by mouth daily as needed for fluid    . glimepiride (AMARYL) 2 MG tablet Take 1 tablet by mouth daily.    Marland Kitchen HYDROcodone-acetaminophen (NORCO) 10-325 MG tablet Take 1 tablet by mouth as directed.    Marland Kitchen JANUVIA 100 MG tablet Take 100 mg by mouth daily.     . Lancets (ACCU-CHEK MULTICLIX) lancets     . losartan (COZAAR) 50 MG tablet Take 25 mg by mouth daily.     . metFORMIN (GLUCOPHAGE) 1000 MG tablet Take 1,000 mg by mouth 2 (two) times daily with a meal.     . Multiple Vitamins-Minerals (MULTIVITAMIN WITH MINERALS) tablet Take 1 tablet by mouth 2 (two) times daily.     [name] Bicarbonate (ZEGERID OTC PO) Take 1 capsule by mouth daily.    . potassium chloride SA (K-DUR,KLOR-CON) 20 MEQ tablet Take 10 mEq by mouth daily.     [name] 20 MG TABS tablet Take 1 tablet by mouth daily.     No current facility-administered medications for this visit.     Allergies:   Review of patient's allergies indicates no known allergies.    Social History:  The patient  reports that he has quit smoking. His smoking use included Cigarettes. He has a 60.00 pack-year smoking history. He does not have any smokeless tobacco history on file. He reports that he drinks about 0.6 oz of alcohol per week . He reports that he does not use drugs.   Family History:  The patient's  Family history is significant for premature CAD  ROS:  Please see the history of present illness.    Otherwise, review of systems positive for none.   All other systems are reviewed and negative.    PHYSICAL EXAM: VS:  BP 136/88 (BP Location: Left Arm, Patient Position: Sitting, Cuff Size: Normal)    Pulse 92   Ht 6' (1.829 m)   Wt 246 lb 12.8 oz (111.9 kg)   BMI 33.47 kg/m  , BMI Body mass index is 33.47 kg/m.  General: Alert, oriented x3, no distress Head: no evidence of trauma, PERRL, EOMI, no exophtalmos or lid lag, no myxedema, no xanthelasma; normal ears, nose and oropharynx Neck: normal jugular venous pulsations and no hepatojugular reflux; brisk carotid pulses without delay and no carotid bruits Chest: clear to auscultation, no signs of consolidation by percussion or palpation, normal fremitus, symmetrical and full respiratory excursions Cardiovascular: normal position and quality of the apical impulse, irregular rhythm, normal first and second heart sounds, no murmurs, rubs or  gallops Abdomen: no tenderness or distention, no masses by palpation, no abnormal pulsatility or arterial bruits, normal bowel sounds, no hepatosplenomegaly Extremities: no clubbing, cyanosis or edema; 2+ radial, ulnar and brachial pulses bilaterally; 2+ right femoral, posterior tibial and dorsalis pedis pulses; 2+ left femoral, posterior tibial and dorsalis pedis pulses; no subclavian or femoral bruits Neurological: grossly nonfocal Psych: euthymic mood, full affect   EKG:  EKG is ordered today. The ekg ordered today demonstrates atrial fibrillation with rightward axis, left posterior fascicular block and no evidence of significant repolarization abnormalities. QTC 457 ms   Recent Labs: 09/11/2015: BUN 20; Creat 0.74; Hemoglobin 13.1; Magnesium 1.7; Platelets 227; Potassium 4.4; Sodium 134    Lipid Panel    Component Value Date/Time   CHOL  04/19/2007 0326    118        ATP III CLASSIFICATION:  <200     mg/dL   Desirable  161-096  mg/dL   Borderline High  >=045    mg/dL   High   TRIG 409 (H) 81/19/1478 0326   HDL 21 (L) 04/19/2007 0326   CHOLHDL 5.6 04/19/2007 0326   VLDL 34 04/19/2007 0326   LDLCALC  04/19/2007 0326    63        Total Cholesterol/HDL:CHD Risk Coronary Heart Disease  Risk Table                     Men   Women  1/2 Average Risk   3.4   3.3      Wt Readings from Last 3 Encounters:  02/05/16 246 lb 12.8 oz (111.9 kg)  09/11/15 252 lb 9.6 oz (114.6 kg)  06/19/15 249 lb 4 oz (113.1 kg)    .   ASSESSMENT AND PLAN:  1. Persistent atrial fibrillation (HCC)   2. Chronic diastolic heart failure (HCC)   3. Mixed hyperlipidemia   4. Coronary artery disease involving native coronary artery of native heart without angina pectoris   5. DM type 2 causing vascular disease (HCC)   6. Obesity (BMI 30-39.9)   7. Long term (current) use of anticoagulants   8. Encounter for monitoring dofetilide therapy      Persistent atrial fibrillation On anticoagulation without embolic or bleeding events. Doing well on Tikosyn, which has led to symptomatic improvement. Repeat labs at least every 6 months.    Chronic diastolic heart failure Seems well compensated, euvolemic, functional class I. 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  Mixed hyperlipidemia Get results from Dr. Sudie Bailey. Continue statin.  CAD (coronary artery disease) He had typical exertional angina before all of his previous coronary events. He has no anginal complaints at this time.   DM Reports symptom improvement in glycemic control. Weight loss will be helpful in management of diabetes, atrial fibrillation and vascular disease.  Continue Xarelto anticoagulation and dofetilide.    Current medicines are reviewed at length with the patient today.  The patient does not have concerns regarding medicines.  The following changes have been made:  no change  Labs/ tests ordered today include:   Orders Placed This Encounter  Procedures  . EKG 12-Lead    Patient Instructions  Dr Royann Shivers recommends that you schedule a follow-up appointment in 6 months. You will receive a reminder letter in the mail two months in advance. If you don't  receive a letter, please call our office to schedule the follow-up appointment.  If you need a refill  on your cardiac medications before your next appointment, please call your pharmacy.     Signed, Thurmon Fair, MD  02/05/2016 8:30 AM    Thurmon Fair, MD, [facility] [phone] office [phone] pager   ROS

## 2016-02-05 ENCOUNTER — Other Ambulatory Visit: Payer: Self-pay | Admitting: Cardiovascular Disease

## 2016-02-05 ENCOUNTER — Ambulatory Visit (INDEPENDENT_AMBULATORY_CARE_PROVIDER_SITE_OTHER): Payer: Federal, State, Local not specified - PPO | Admitting: Cardiovascular Disease

## 2016-02-05 ENCOUNTER — Encounter: Payer: Self-pay | Admitting: Cardiovascular Disease

## 2016-02-05 VITALS — BP 136/88 | HR 92 | Ht 72.0 in | Wt 246.8 lb

## 2016-02-05 DIAGNOSIS — I5032 Chronic diastolic (congestive) heart failure: Secondary | ICD-10-CM

## 2016-02-05 DIAGNOSIS — E782 Mixed hyperlipidemia: Secondary | ICD-10-CM | POA: Diagnosis not present

## 2016-02-05 DIAGNOSIS — E1159 Type 2 diabetes mellitus with other circulatory complications: Secondary | ICD-10-CM

## 2016-02-05 DIAGNOSIS — I2510 Atherosclerotic heart disease of native coronary artery without angina pectoris: Secondary | ICD-10-CM | POA: Diagnosis not present

## 2016-02-05 DIAGNOSIS — I4819 Other persistent atrial fibrillation: Secondary | ICD-10-CM

## 2016-02-05 DIAGNOSIS — I481 Persistent atrial fibrillation: Secondary | ICD-10-CM | POA: Diagnosis not present

## 2016-02-05 DIAGNOSIS — Z79899 Other long term (current) drug therapy: Secondary | ICD-10-CM

## 2016-02-05 DIAGNOSIS — Z5181 Encounter for therapeutic drug level monitoring: Secondary | ICD-10-CM

## 2016-02-05 DIAGNOSIS — Z7901 Long term (current) use of anticoagulants: Secondary | ICD-10-CM

## 2016-02-05 DIAGNOSIS — E669 Obesity, unspecified: Secondary | ICD-10-CM

## 2016-02-05 NOTE — Patient Instructions (Signed)
Dr Croitoru recommends that you schedule a follow-up appointment in 6 months. You will receive a reminder letter in the mail two months in advance. If you don't receive a letter, please call our office to schedule the follow-up appointment.  If you need a refill on your cardiac medications before your next appointment, please call your pharmacy. 

## 2016-05-28 ENCOUNTER — Telehealth: Payer: Self-pay

## 2016-05-28 ENCOUNTER — Encounter: Payer: Self-pay | Admitting: Cardiovascular Disease

## 2016-05-28 NOTE — Telephone Encounter (Signed)
Request for surgical clearance:   1. What type surgery is being performed? Right knee arthroscopy with medial and lateral menisectomy  2. When is this surgery scheduled? pending  3. Are there any medications that need to be held prior to surgery and how long? Xarelto 20 mg daily  4. Name of the physician performing surgery: Dr Ranee Gosselinonald Gioffre  5. What is the office phone and fax number?   Phone [phone]  Fax [phone]

## 2016-05-28 NOTE — Telephone Encounter (Signed)
Sent via epic 

## 2016-06-11 NOTE — Telephone Encounter (Signed)
Routed LETTER to Dr Darrelyn HillockGIOFFRE OFFICE  ATTN VELVET  PATIENT'S WIFE AWARE

## 2016-06-11 NOTE — Telephone Encounter (Signed)
Follow up      Dr Jeannetta EllisGioffre's office told patient that they have not received our surgical clearance and for them to call us and ask us to send it. Wife is calling asking us to resend surgical clearance.  If any problems, please call

## 2016-08-05 ENCOUNTER — Ambulatory Visit (INDEPENDENT_AMBULATORY_CARE_PROVIDER_SITE_OTHER): Payer: Federal, State, Local not specified - PPO | Admitting: Cardiovascular Disease

## 2016-08-05 ENCOUNTER — Encounter: Payer: Self-pay | Admitting: Cardiovascular Disease

## 2016-08-05 VITALS — BP 128/88 | HR 81 | Ht 72.0 in | Wt 260.0 lb

## 2016-08-05 DIAGNOSIS — I481 Persistent atrial fibrillation: Secondary | ICD-10-CM

## 2016-08-05 DIAGNOSIS — E782 Mixed hyperlipidemia: Secondary | ICD-10-CM

## 2016-08-05 DIAGNOSIS — E1159 Type 2 diabetes mellitus with other circulatory complications: Secondary | ICD-10-CM

## 2016-08-05 DIAGNOSIS — I4819 Other persistent atrial fibrillation: Secondary | ICD-10-CM

## 2016-08-05 DIAGNOSIS — Z7901 Long term (current) use of anticoagulants: Secondary | ICD-10-CM

## 2016-08-05 DIAGNOSIS — I2510 Atherosclerotic heart disease of native coronary artery without angina pectoris: Secondary | ICD-10-CM

## 2016-08-05 DIAGNOSIS — I5032 Chronic diastolic (congestive) heart failure: Secondary | ICD-10-CM

## 2016-08-05 NOTE — Patient Instructions (Signed)
Dr Croitoru recommRoyann Shiversends that you schedule a follow-up appointment in 1 year. You will receive a reminder letter in the mail two months in advance. If you don't receive a letter, please call our office to schedule the follow-up appointment.  If you need a refill on your cardiac medications before your next appointment, please call your pharmacy.   You will need to have an EKG performed in 6 months (August 2018). It is okay to have this completed at Dr Michelle NasutiKnowlton's office.

## 2016-08-05 NOTE — Progress Notes (Signed)
Cardiology Office Note    Date:  08/05/2016   ID:  [name], DOB 1961/03/22, MRN [id number]  PCP:  Milana ObeyStephen D Knowlton, MD  Cardiologist:   Thurmon FairMihai Rashidah [place], MD   Chief Complaint  Patient presents with  . Follow-up    History of Present Illness:  [name] is a 56 y.o. male who presents for CAD, atrial fibrillation on Tikosyn.  He feels well, continues to work hard. He has occasional gingival bleeding. He is not had any serious bleeding problems.   As gain some weight, which happens to him every winter, when he is less physically active. 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 He denies angina or dyspnea on exertion even with strenuous activity. He is occasionally aware of palpitations but these are not associated syncope, angina or dyspnea.   At some problems with joint discomfort, especially right knee pain and temporarily interrupted his statin. During that time his LDL was elevated at 150. He subsequently restarted statin therapy and had repeat labs in November, which I don't have access to right now. He almost underwent right knee surgery but decided to proceed conservatively.  QTC today is normal at 429 ms. Most recent creatinine was 0.7. Normal potassium level  He first presented with coronary disease at age 56 (bare-metal [id number] stent to proximal LAD in the setting of acute myocardial infarction) and had recurrent problems in 2007 (right coronary artery drug-eluting Taxus stents to the proximal RCA 3.0x12 and the mid PLA 2.5x16, followed by same admission placement of 2 drug-eluting Cypher 3.0x13 stents to the mid LAD). Bypass surgery was performed after repeat cardiac catheterization October 2008 showed 50% proximal LAD stent restenosis followed by aneurysmal dilatation and an 85% stenosis of the LAD involving the first diagonal branch as well as moderate lesions in the left circumflex and right coronary artery. He underwent four-vessel bypass  surgery in 2008. (Dr. Laneta SimmersBartle, LIMA to LAD, SVG to first diagonal, SVG to first OM, SVG to PDA).  His most recent functional study was an echocardiogram in 2015 that showed normal overall left ventricular systolic function and a mildly dilated left atrium. He has not had a nuclear stress test since his bypass procedure.  He described classical angina pectoris and shortness of breath before his coronary events. (substernal pressure and burning, different from his reflux pain). Angina has not recurred since his bypass   He did have postoperative paroxysmal atrial fibrillation, recurred in 2014, recorded incidentally during a routine exam. He is aware of the palpitations and thinks that he does a little worse when he is in the arrhythmia. Rate control is excellent, due to chronic treatment with beta blockers. He does not have a history of stroke or TIA or other embolic events and does not have a known bleeding tendency or abnormal bleeding. CHADS2VASC score: at least 3. He failed Multaq therapy but has done well on Tikosyn.  Additional problems include type 2 diabetes mellitus, obesity, hypertension and gastroesophageal reflux disease. He has mixed hyperlipidemia. He is a former smoker. He may have a history of asbestos exposure when he worked in the [employer]. 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     Past Medical History:  Diagnosis Date  . Abdominal distention   . Anemia    Postoperative  . Coronary artery disease   . Diabetes (HCC)   . Edema   .  H/O gastroesophageal reflux (GERD)   . High cholesterol   . Hypertension   . Myocardial infarction   . Obesity   . Persistent atrial fibrillation (HCC)   . Pleural effusion    resolved with therapy    Past Surgical History:  Procedure Laterality Date  . CARDIAC CATHETERIZATION  2008  . CORONARY ANGIOPLASTY    . CORONARY ARTERY BYPASS GRAFT     X4  . exercise cardiolite  myocardial perfusion study      Current Medications: Outpatient Medications Prior to Visit  Medication Sig Dispense Refill  . ACCU-CHEK AVIVA PLUS test strip     . atorvastatin (LIPITOR) 40 MG tablet Take 40 mg by mouth daily at 6 PM. Generic only    . BYSTOLIC 10 MG tablet Take 5 mg by mouth every morning.     . ferrous sulfate 325 (65 FE) MG tablet Take 325 mg by mouth daily.    . furosemide (LASIX) 40 MG tablet Take 1-2 tablets by mouth daily as needed for fluid    . glimepiride (AMARYL) 2 MG tablet Take 1 tablet by mouth daily.    Marland Kitchen HYDROcodone-acetaminophen (NORCO) 10-325 MG tablet Take 1 tablet by mouth as directed.    Marland Kitchen JANUVIA 100 MG tablet Take 100 mg by mouth daily.     . Lancets (ACCU-CHEK MULTICLIX) lancets     . losartan (COZAAR) 50 MG tablet Take 25 mg by mouth daily.     . metFORMIN (GLUCOPHAGE) 1000 MG tablet Take 1,000 mg by mouth 2 (two) times daily with a meal.     . Multiple Vitamins-Minerals (MULTIVITAMIN WITH MINERALS) tablet Take 1 tablet by mouth 2 (two) times daily.     [name] Bicarbonate (ZEGERID OTC PO) Take 1 capsule by mouth daily.    . potassium chloride SA (K-DUR,KLOR-CON) 20 MEQ tablet Take 10 mEq by mouth daily.     Marland Kitchen TIKOSYN 500 MCG capsule TAKE 1 CAPSULE (500 MCG TOTAL) BY MOUTH 2 (TWO) TIMES DAILY. CONSULT MD BEFORE TAKING ANY NEW MEDICATIONS. REPORT SIDE EFFECTS TO MD. 60 capsule 5  . XARELTO 20 MG TABS tablet Take 1 tablet by mouth daily.     No facility-administered medications prior to visit.      Allergies:   Patient has no known allergies.   Social History   Social History  . Marital status: Married    Spouse name: N/A  . Number of children: N/A  . Years of education: N/A   Social History Main Topics  . Smoking status: Former Smoker    Packs/day: 2.00    Years: 30.00    Types: Cigarettes  . Smokeless tobacco: Not on file  . Alcohol use 0.6 oz/week    1 Standard drinks or equivalent per week  . Drug use: No  . Sexual  activity: Not on file   Other Topics Concern  . Not on file   Social History Narrative  . No narrative on file     Family History:   significant for early onset coronary disease  ROS:   Please see the history of present illness.    ROS All other systems reviewed and are negative.   PHYSICAL EXAM:   VS:  BP 128/88 (BP Location: Left Arm, Patient Position: Sitting, Cuff Size: Normal)   Pulse 81   Ht 6' (1.829 m)   Wt 117.9 kg (260 lb)   BMI 35.26 kg/m    GEN: Well nourished, well developed, in no acute distress  HEENT: normal  Neck: no JVD, carotid bruits, or masses Cardiac: RRR; no murmurs, rubs, or gallops,no edema  Respiratory:  clear to auscultation bilaterally, normal work of breathing GI: soft, nontender, nondistended, + BS MS: no deformity or atrophy  Skin: warm and dry, no rash Neuro:  Alert and Oriented x 3, Strength and sensation are intact Psych: euthymic mood, full affect  Wt Readings from Last 3 Encounters:  08/05/16 117.9 kg (260 lb)  02/05/16 111.9 kg (246 lb 12.8 oz)  09/11/15 114.6 kg (252 lb 9.6 oz)      Studies/Labs Reviewed:   EKG:  EKG is ordered today.  The ekg ordered today demonstrates Normal sinus rhythm, QTC 429 ms, otherwise normal  Recent Labs: 09/11/2015: BUN 20; Creat 0.74; Hemoglobin 13.1; Magnesium 1.7; Platelets 227; Potassium 4.4; Sodium 134   Lipid Panel    Component Value Date/Time   CHOL  04/19/2007 0326    118        ATP III CLASSIFICATION:  <200     mg/dL   Desirable  161-096  mg/dL   Borderline High  >=045    mg/dL   High   TRIG 409 (H) 81/19/1478 0326   HDL 21 (L) 04/19/2007 0326   CHOLHDL 5.6 04/19/2007 0326   VLDL 34 04/19/2007 0326   LDLCALC  04/19/2007 0326    63        Total Cholesterol/HDL:CHD Risk Coronary Heart Disease Risk Table                     Men   Women  1/2 Average Risk   3.4   3.3     ASSESSMENT:    1. Persistent atrial fibrillation (HCC)   2. Chronic diastolic heart failure (HCC)   3.  Coronary artery disease involving native coronary artery of native heart without angina pectoris   4. Mixed hyperlipidemia   5. DM type 2 causing vascular disease (HCC)   6. Long term current use of anticoagulant therapy      PLAN:  In order of problems listed above:  1. AFib: He'll have some mildly symptomatic breakthrough episodes, overall well controlled. Appropriately anticoagulated without any serious bleeding complications. Needs an electrocardiogram and basic metabolic panel every 6 months. He thinks he can get these done in 6 months and his primary care provider's office and then we'll see him back in the office in a year. Reminded him about the potential for multiple drug interactions with dofetilide. 2. CHF: I think he is euvolemic. NYHA functional class I-II. He has mild residual dyspnea but also has a history of asbestos exposure and chronic lung problems. 3. CAD: Asymptomatic, roughly 10 years since his bypass surgery. 4. HLP: LDL was unacceptably high when he interrupted statin. He is now back on medications. We will retrieve his most recent labs. 5. DM: None have his most recent hemoglobin A1c, but he believes it was just over 7%. He needs to lose weight. 6. Xarelto: Some nuisance bleeding but no serious hemorrhagic complications.    Medication Adjustments/Labs and Tests Ordered: Current medicines are reviewed at length with the patient today.  Concerns regarding medicines are outlined above.  Medication changes, Labs and Tests ordered today are listed in the Patient Instructions below. Patient Instructions  Dr Royann Shivers recommends that you schedule a follow-up appointment in 1 year. You will receive a reminder letter in the mail two months in advance. If you don't receive a letter, please call our office to schedule  the follow-up appointment.  If you need a refill on your cardiac medications before your next appointment, please call your pharmacy.   You will need to have an  EKG performed in 6 months (August 2018). It is okay to have this completed at Dr Michelle Nasuti office.    Signed, Thurmon Fair, MD  08/05/2016 3:46 PM    [facility] [address] [place], [place], [place]  [address] Phone: [phone]; Fax: [phone]

## 2016-08-05 NOTE — Progress Notes (Signed)
ROS  ERROR

## 2016-08-31 ENCOUNTER — Other Ambulatory Visit: Payer: Self-pay | Admitting: Cardiovascular Disease

## 2016-09-02 NOTE — Telephone Encounter (Signed)
Rx(s) sent to pharmacy electronically.  

## 2016-09-10 ENCOUNTER — Telehealth: Payer: Self-pay | Admitting: Internal Medicine

## 2016-09-10 NOTE — Telephone Encounter (Signed)
Informed ok to take w/ Tikosyn. Wife verbalized understanding and agreeable to plan.

## 2016-09-10 NOTE — Telephone Encounter (Signed)
New Message   Pt is taking Tikosyn , will this medication cause and interaction?  Biclofenac Sodium 1% topical Jel

## 2016-09-24 ENCOUNTER — Observation Stay (HOSPITAL_COMMUNITY)
Admission: EM | Admit: 2016-09-24 | Discharge: 2016-09-26 | Disposition: A | Discharge status: Home / Self Care | Payer: Federal, State, Local not specified - PPO | Attending: Cardiology | Admitting: Cardiology

## 2016-09-24 ENCOUNTER — Encounter (HOSPITAL_COMMUNITY): Payer: Self-pay | Admitting: Emergency Medicine

## 2016-09-24 ENCOUNTER — Emergency Department (HOSPITAL_COMMUNITY): Payer: Federal, State, Local not specified - PPO

## 2016-09-24 DIAGNOSIS — Z6832 Body mass index (BMI) 32.0-32.9, adult: Secondary | ICD-10-CM | POA: Diagnosis not present

## 2016-09-24 DIAGNOSIS — I4819 Other persistent atrial fibrillation: Secondary | ICD-10-CM | POA: Diagnosis present

## 2016-09-24 DIAGNOSIS — I209 Angina pectoris, unspecified: Secondary | ICD-10-CM | POA: Diagnosis present

## 2016-09-24 DIAGNOSIS — D649 Anemia, unspecified: Secondary | ICD-10-CM | POA: Insufficient documentation

## 2016-09-24 DIAGNOSIS — Z7901 Long term (current) use of anticoagulants: Secondary | ICD-10-CM | POA: Diagnosis not present

## 2016-09-24 DIAGNOSIS — K219 Gastro-esophageal reflux disease without esophagitis: Secondary | ICD-10-CM | POA: Diagnosis not present

## 2016-09-24 DIAGNOSIS — Z7984 Long term (current) use of oral hypoglycemic drugs: Secondary | ICD-10-CM | POA: Insufficient documentation

## 2016-09-24 DIAGNOSIS — I252 Old myocardial infarction: Secondary | ICD-10-CM | POA: Diagnosis not present

## 2016-09-24 DIAGNOSIS — R079 Chest pain, unspecified: Secondary | ICD-10-CM | POA: Diagnosis present

## 2016-09-24 DIAGNOSIS — Z955 Presence of coronary angioplasty implant and graft: Secondary | ICD-10-CM | POA: Insufficient documentation

## 2016-09-24 DIAGNOSIS — I5032 Chronic diastolic (congestive) heart failure: Secondary | ICD-10-CM | POA: Diagnosis not present

## 2016-09-24 DIAGNOSIS — E782 Mixed hyperlipidemia: Secondary | ICD-10-CM | POA: Diagnosis not present

## 2016-09-24 DIAGNOSIS — E119 Type 2 diabetes mellitus without complications: Secondary | ICD-10-CM | POA: Diagnosis present

## 2016-09-24 DIAGNOSIS — I25710 Atherosclerosis of autologous vein coronary artery bypass graft(s) with unstable angina pectoris: Secondary | ICD-10-CM | POA: Diagnosis not present

## 2016-09-24 DIAGNOSIS — I200 Unstable angina: Secondary | ICD-10-CM | POA: Diagnosis not present

## 2016-09-24 DIAGNOSIS — I25110 Atherosclerotic heart disease of native coronary artery with unstable angina pectoris: Secondary | ICD-10-CM | POA: Diagnosis not present

## 2016-09-24 DIAGNOSIS — Z87891 Personal history of nicotine dependence: Secondary | ICD-10-CM | POA: Insufficient documentation

## 2016-09-24 DIAGNOSIS — Z8249 Family history of ischemic heart disease and other diseases of the circulatory system: Secondary | ICD-10-CM | POA: Diagnosis not present

## 2016-09-24 DIAGNOSIS — I10 Essential (primary) hypertension: Secondary | ICD-10-CM | POA: Diagnosis present

## 2016-09-24 DIAGNOSIS — I110 Hypertensive heart disease with heart failure: Secondary | ICD-10-CM | POA: Diagnosis not present

## 2016-09-24 DIAGNOSIS — E785 Hyperlipidemia, unspecified: Secondary | ICD-10-CM | POA: Diagnosis present

## 2016-09-24 DIAGNOSIS — I481 Persistent atrial fibrillation: Secondary | ICD-10-CM

## 2016-09-24 DIAGNOSIS — E1159 Type 2 diabetes mellitus with other circulatory complications: Secondary | ICD-10-CM | POA: Insufficient documentation

## 2016-09-24 DIAGNOSIS — E669 Obesity, unspecified: Secondary | ICD-10-CM | POA: Diagnosis not present

## 2016-09-24 DIAGNOSIS — Z951 Presence of aortocoronary bypass graft: Secondary | ICD-10-CM | POA: Diagnosis present

## 2016-09-24 HISTORY — DX: Chronic diastolic (congestive) heart failure: I50.32

## 2016-09-24 LAB — CBC WITH DIFFERENTIAL/PLATELET
BASOS ABS: 0 10*3/uL (ref 0.0–0.1)
BASOS PCT: 0 %
EOS PCT: 1 %
Eosinophils Absolute: 0.1 10*3/uL (ref 0.0–0.7)
HCT: 41.3 % (ref 39.0–52.0)
Hemoglobin: 13.6 g/dL (ref 13.0–17.0)
LYMPHS PCT: 24 %
Lymphs Abs: 2.3 10*3/uL (ref 0.7–4.0)
MCH: 28.8 pg (ref 26.0–34.0)
MCHC: 32.9 g/dL (ref 30.0–36.0)
MCV: 87.3 fL (ref 78.0–100.0)
Monocytes Absolute: 0.7 10*3/uL (ref 0.1–1.0)
Monocytes Relative: 8 %
Neutro Abs: 6.5 10*3/uL (ref 1.7–7.7)
Neutrophils Relative %: 67 %
PLATELETS: 232 10*3/uL (ref 150–400)
RBC: 4.73 MIL/uL (ref 4.22–5.81)
RDW: 13.2 % (ref 11.5–15.5)
WBC: 9.6 10*3/uL (ref 4.0–10.5)

## 2016-09-24 LAB — GLUCOSE, CAPILLARY: GLUCOSE-CAPILLARY: 182 mg/dL — AB (ref 65–99)

## 2016-09-24 LAB — LIPID PANEL
CHOLESTEROL: 91 mg/dL (ref 0–200)
HDL: 23 mg/dL — ABNORMAL LOW (ref >40)
LDL Cholesterol: 49 mg/dL (ref 0–99)
Total CHOL/HDL Ratio: 4 RATIO
Triglycerides: 97 mg/dL (ref <150)
VLDL: 19 mg/dL (ref 0–40)

## 2016-09-24 LAB — COMPREHENSIVE METABOLIC PANEL
ALBUMIN: 4.5 g/dL (ref 3.5–5.0)
ALT: 33 U/L (ref 17–63)
AST: 25 U/L (ref 15–41)
Alkaline Phosphatase: 34 U/L — ABNORMAL LOW (ref 38–126)
Anion gap: 9 (ref 5–15)
BUN: 22 mg/dL — AB (ref 6–20)
CHLORIDE: 102 mmol/L (ref 101–111)
CO2: 26 mmol/L (ref 22–32)
CREATININE: 1.16 mg/dL (ref 0.61–1.24)
Calcium: 9.9 mg/dL (ref 8.9–10.3)
GFR calc Af Amer: >60 mL/min (ref >60)
GLUCOSE: 200 mg/dL — AB (ref 65–99)
Potassium: 3.9 mmol/L (ref 3.5–5.1)
SODIUM: 137 mmol/L (ref 135–145)
Total Bilirubin: 0.7 mg/dL (ref 0.3–1.2)
Total Protein: 8.1 g/dL (ref 6.5–8.1)

## 2016-09-24 LAB — TROPONIN I
Troponin I: <0.03 ng/mL (ref <0.03)
Troponin I: <0.03 ng/mL (ref <0.03)

## 2016-09-24 LAB — TSH: TSH: 0.519 u[IU]/mL (ref 0.350–4.500)

## 2016-09-24 MED ORDER — HEPARIN (PORCINE) IN NACL 100-0.45 UNIT/ML-% IJ SOLN
1600.0000 [IU]/h | INTRAMUSCULAR | Status: DC
  Administered 2016-09-25: 1600 [IU]/h via INTRAVENOUS
  Filled 2016-09-24: qty 250

## 2016-09-24 MED ORDER — PANTOPRAZOLE SODIUM 40 MG PO PACK
40.0000 mg | PACK | Freq: Every day | ORAL | Status: DC
  Administered 2016-09-25: 40 mg via ORAL
  Filled 2016-09-24 (×2): qty 20

## 2016-09-24 MED ORDER — NITROGLYCERIN 0.4 MG SL SUBL
0.4000 mg | SUBLINGUAL_TABLET | SUBLINGUAL | Status: DC | PRN
  Administered 2016-09-24 (×2): 0.4 mg via SUBLINGUAL
  Filled 2016-09-24: qty 1

## 2016-09-24 MED ORDER — ASPIRIN 81 MG PO CHEW
324.0000 mg | CHEWABLE_TABLET | Freq: Once | ORAL | Status: AC
  Administered 2016-09-24: 324 mg via ORAL
  Filled 2016-09-24: qty 4

## 2016-09-24 MED ORDER — HYDROCODONE-ACETAMINOPHEN 10-325 MG PO TABS: 1.0000 | ORAL_TABLET | ORAL | Status: DC

## 2016-09-24 MED ORDER — ACETAMINOPHEN 325 MG PO TABS: 650.0000 mg | ORAL_TABLET | ORAL | Status: DC | PRN

## 2016-09-24 MED ORDER — INSULIN ASPART 100 UNIT/ML ~~LOC~~ SOLN
0.0000 [IU] | Freq: Every day | SUBCUTANEOUS | Status: DC
Start: 2016-09-24 — End: 2016-09-26
  Administered 2016-09-25: 3 [IU] via SUBCUTANEOUS

## 2016-09-24 MED ORDER — ATORVASTATIN CALCIUM 40 MG PO TABS: 40.0000 mg | ORAL_TABLET | Freq: Every day | ORAL | Status: DC

## 2016-09-24 MED ORDER — ASPIRIN EC 81 MG PO TBEC
81.0000 mg | DELAYED_RELEASE_TABLET | Freq: Every day | ORAL | Status: DC
  Administered 2016-09-25 – 2016-09-26 (×2): 81 mg via ORAL
  Filled 2016-09-24 (×2): qty 1

## 2016-09-24 MED ORDER — GI COCKTAIL ~~LOC~~: 30.0000 mL | Freq: Four times a day (QID) | ORAL | Status: DC | PRN

## 2016-09-24 MED ORDER — OMEPRAZOLE-SODIUM BICARBONATE 20-1100 MG PO CAPS: 1.0000 | ORAL_CAPSULE | Freq: Every day | ORAL | Status: DC

## 2016-09-24 MED ORDER — ONDANSETRON HCL 4 MG/2ML IJ SOLN: 4.0000 mg | Freq: Four times a day (QID) | INTRAMUSCULAR | Status: DC | PRN

## 2016-09-24 MED ORDER — LOSARTAN POTASSIUM 25 MG PO TABS
25.0000 mg | ORAL_TABLET | Freq: Every day | ORAL | Status: DC
  Administered 2016-09-25 – 2016-09-26 (×2): 25 mg via ORAL
  Filled 2016-09-24 (×4): qty 1

## 2016-09-24 MED ORDER — NEBIVOLOL HCL 2.5 MG PO TABS
5.0000 mg | ORAL_TABLET | Freq: Every day | ORAL | Status: DC
  Administered 2016-09-25: 5 mg via ORAL
  Filled 2016-09-24 (×2): qty 2
  Filled 2016-09-24: qty 1

## 2016-09-24 MED ORDER — ATORVASTATIN CALCIUM 40 MG PO TABS
80.0000 mg | ORAL_TABLET | Freq: Every day | ORAL | Status: DC
  Administered 2016-09-24: 80 mg via ORAL
  Filled 2016-09-24: qty 1
  Filled 2016-09-24: qty 2
  Filled 2016-09-24 (×2): qty 1

## 2016-09-24 MED ORDER — DOFETILIDE 500 MCG PO CAPS
500.0000 ug | ORAL_CAPSULE | Freq: Two times a day (BID) | ORAL | Status: DC
  Administered 2016-09-24 – 2016-09-26 (×4): 500 ug via ORAL
  Filled 2016-09-24 (×8): qty 1

## 2016-09-24 MED ORDER — INSULIN ASPART 100 UNIT/ML ~~LOC~~ SOLN
0.0000 [IU] | Freq: Three times a day (TID) | SUBCUTANEOUS | Status: DC
  Administered 2016-09-25 (×2): 3 [IU] via SUBCUTANEOUS
  Administered 2016-09-26: 8 [IU] via SUBCUTANEOUS

## 2016-09-24 MED ORDER — ASPIRIN EC 325 MG PO TBEC: 325.0000 mg | DELAYED_RELEASE_TABLET | Freq: Every day | ORAL | Status: DC

## 2016-09-24 MED ORDER — MORPHINE SULFATE (PF) 2 MG/ML IV SOLN: 2.0000 mg | INTRAVENOUS | Status: DC | PRN

## 2016-09-24 NOTE — ED Notes (Signed)
Nurse unavailable to take report, states she will call writer back.

## 2016-09-24 NOTE — Progress Notes (Addendum)
ANTICOAGULATION CONSULT NOTE - Initial Consult  Pharmacy Consult for heparin Indication: chest pain/ACS  No Known Allergies  Patient Measurements: Height: 6' (182.9 cm) Weight: 247 lb 11.2 oz (112.4 kg) IBW/kg (Calculated) : 77.6 HEPARIN DW (KG): 100.8   Vital Signs: Temp: 98 F (36.7 C) (03/28 0620) Temp Source: Oral (03/28 0620) BP: 112/75 (03/28 0620) Pulse Rate: 71 (03/28 0620)  Labs:  Recent Labs  09/24/16 1344 09/24/16 2040 09/25/16 0145 09/25/16 0722  HGB 13.6  --   --   --   HCT 41.3  --   --   --   PLT 232  --   --   --   APTT  --   --   --  32  HEPARINUNFRC  --   --   --  0.49  CREATININE 1.16  --   --   --   TROPONINI <0.03 <0.03 <0.03 <0.03    Estimated Creatinine Clearance: 94.2 mL/min (by C-G formula based on SCr of 1.16 mg/dL).   Medical History: Past Medical History:  Diagnosis Date  . Abdominal distention   . Anemia    Postoperative  . Chronic diastolic CHF (congestive heart failure) (HCC)   . Coronary artery disease    a. premature - acute MI age 56 s/p BMS to prox LAD. b. s/p DES to RCA, PLA, mLAD in 2007. c. 4V CABG in 2008 with LIMA to LAD, SVG to first diagonal, SVG to first OM, SVG to PDA  . Diabetes (HCC)   . Edema   . H/O gastroesophageal reflux (GERD)   . High cholesterol   . Hypertension   . Myocardial infarction   . Obesity   . Persistent atrial fibrillation (HCC)    a. on Tikosyn, Xarelto.  . Pleural effusion    resolved with therapy    Medications:  Was taking xarelto 20 mg po daily, last dose 3/27 at 08:30am  Assessment: 56 yo man to start heparin for CP, r/o ACS.  Will likely need to monitor aPTTs until xarelto effects wear off. Goal of Therapy:  Heparin level 0.3-0.7 units/ml APTT 66-102 sec Monitor platelets by anticoagulation protocol: Yes   Plan:  Will check baseline heparin level and aPTT with am labs Start heparin drip at 1600 units/hr tomorrow at 08:00 Daily HL, aPTT and CBC while on heparin Monitor for  bleeding complications  Baseline HL = 0.49, aPTT 29.  Will monitor aPTTs for heparin adjustment.  Thanks for allowing pharmacy to be a part of this patient's care.  Mady GemmaHayes, Tadao Emig R, [facility] HospitalRPH 09/25/2016,8:37 AM

## 2016-09-24 NOTE — Progress Notes (Addendum)
Patient discussed with ER staff, presents with chest pain. History reviewed along with EKG and labs. History of CAD, presents with chest pain. Currently pain free, enzymes negative x 1, EKG fairly chronic ST/T changes. [facility]. Cycle enzymes and EKG overnight. Hold xarelto for his afib, start heparin gtt. Keep npo overnight, consider ischemic testing whether cath or stress testing tomorrow AM. Continue home statin, beta blocker, ARB. Would start aspirin 81. Hold oral diabetic agents while inpatient. Full consult to follow tomorrow morning.    Dina RichJonathan Harper Smoker MD

## 2016-09-24 NOTE — H&P (Signed)
History and Physical    [name] ZOX:[id number] DOB: 08-17-1960 DOA: 09/24/2016  PCP: Milana Obey, MD Consultants:  Croitoru - cardiology Patient coming from: home - lives with wife; [facility]: wife, [phone]  Chief Complaint: chest pain  HPI: [name] is a 56 y.o. male with medical history significant of premature CAD s/p many stents and remote CABG; PAF on Tikosyn, Xarelto; HTN; HLD; DM presenting with chest pain.  This AM at work, he noticed chest tightness.  Pain was getting worse.  He has h/o heart problems and so it scared him some.  Thought it might be indigestion but it began radiating into back and jaw and this made him concerned.  He was also SOB, diaphoretic, and nauseated.  By the time he got to the ER, it had eased off.  After second NTG, it was completely gone.  Substernal, he was actively cooking when the pain started.  This was not at all similar to prior index symptoms.  Has had stents and CABG and then he could tell ahead of time that he was having a heart problem.  All of those times, the chest pain felt like when he was smoking and when you step outside and take a deep breath and feel that cold deep in the lungs.  Today's pain, on the other hand, was "severe, straight on pain".  Initial heart problem required stents at 56 years old - 3 stents.  A few years later, he had more stents, 2-3 more.  About 7-8 years ago, he had CABG of 5 vessels (plan had been to do 7).  Father's side of the family with marked CAD.  Cholesterol medication is working well, total cholesterol about 100 a month ago.  Also with DM, recently much better control than prior.  BP is usually decent on 3 BP medications.  Quit smoking years ago (after 2nd set of stents).  Last stress test was with 2nd set of stents.  Last cath was with CABG.  ED Course: Unstable anginga with h/o CAD.  Negative troponin, chest pain resolved.  Cardiology consult requested.  Review of Systems: As per HPI; otherwise 10  point review of systems reviewed and negative.   Ambulatory Status:  Ambulates without assistance  Past Medical History:  Diagnosis Date  . Abdominal distention   . Anemia    Postoperative  . Chronic diastolic CHF (congestive heart failure) (HCC)   . Coronary artery disease    a. premature - acute MI age 81 s/p BMS to prox LAD. b. s/p DES to RCA, PLA, mLAD in 2007. c. 4V CABG in 2008 with LIMA to LAD, SVG to first diagonal, SVG to first OM, SVG to PDA  . Diabetes (HCC)   . Edema   . H/O gastroesophageal reflux (GERD)   . High cholesterol   . Hypertension   . Myocardial infarction   . Obesity   . Persistent atrial fibrillation (HCC)    a. on Tikosyn, Xarelto.  . Pleural effusion    resolved with therapy    Past Surgical History:  Procedure Laterality Date  . CARDIAC CATHETERIZATION  2008  . CORONARY ANGIOPLASTY    . CORONARY ARTERY BYPASS GRAFT     X4  . exercise cardiolite myocardial perfusion study      Social History   Social History  . Marital status: Married    Spouse name: N/A  . Number of children: N/A  . Years of education: N/A   Occupational History  .  cook    Social History Main Topics  . Smoking status: Former Smoker    Packs/day: 2.00    Years: 30.00    Types: Cigarettes  . Smokeless tobacco: Never Used  . Alcohol use 0.6 oz/week    1 Standard drinks or equivalent per week     Comment: rare  . Drug use: No  . Sexual activity: Not on file   Other Topics Concern  . Not on file   Social History Narrative  . No narrative on file    No Known Allergies  Family History  Problem Relation Age of Onset  . CAD Father   . Vascular Disease Father     carotid artery disease    Prior to Admission medications   Medication Sig Start Date End Date Taking? Authorizing Provider  ACCU-CHEK AVIVA PLUS test strip  02/08/14  Yes Historical Provider, MD  atorvastatin (LIPITOR) 40 MG tablet Take 40 mg by mouth daily at 6 PM. Generic only 03/26/14  Yes  Historical Provider, MD  BYSTOLIC 10 MG tablet Take 5 mg by mouth every morning.  02/08/14  Yes Historical Provider, MD  ferrous sulfate 325 (65 FE) MG tablet Take 325 mg by mouth daily.   Yes Historical Provider, MD  furosemide (LASIX) 40 MG tablet Take 1-2 tablets by mouth daily as needed for fluid 03/26/14  Yes Historical Provider, MD  glimepiride (AMARYL) 2 MG tablet Take 1 tablet by mouth daily. 01/08/16  Yes Historical Provider, MD  HYDROcodone-acetaminophen (NORCO) 10-325 MG tablet Take 1 tablet by mouth as directed. 09/04/15  Yes Historical Provider, MD  JANUVIA 100 MG tablet Take 100 mg by mouth daily.  02/05/14  Yes Historical Provider, MD  Lancets (ACCU-CHEK MULTICLIX) lancets  02/08/14  Yes Historical Provider, MD  losartan (COZAAR) 50 MG tablet Take 25 mg by mouth daily.  02/08/14  Yes Historical Provider, MD  metFORMIN (GLUCOPHAGE) 1000 MG tablet Take 1,000 mg by mouth 2 (two) times daily with a meal.  01/17/14  Yes Historical Provider, MD  Multiple Vitamins-Minerals (MULTIVITAMIN WITH MINERALS) tablet Take 1 tablet by mouth 2 (two) times daily.    Yes Historical Provider, MD  Omeprazole-Sodium Bicarbonate (ZEGERID OTC PO) Take 1 capsule by mouth daily.   Yes Historical Provider, MD  potassium chloride SA (K-DUR,KLOR-CON) 20 MEQ tablet Take 10 mEq by mouth daily.    Yes Historical Provider, MD  TIKOSYN 500 MCG capsule TAKE 1 CAPSULE (500 MCG TOTAL) BY MOUTH 2 (TWO) TIMES DAILY. CONSULT MD BEFORE TAKING ANY NEW MEDICATIONS. REPORT SIDE EFFECTS TO MD. 09/02/16  Yes Mihai Croitoru, MD  XARELTO 20 MG TABS tablet Take 1 tablet by mouth daily. 04/22/14  Yes Historical Provider, MD    Physical Exam: Vitals:   09/24/16 1900 09/24/16 1915 09/24/16 1925 09/24/16 1930  BP: 123/89  123/89 (!) 124/98  Pulse: 69 70 80   Resp:   20   Temp:      TempSrc:      SpO2: 97% 97% 98%   Weight:      Height:         General:  Appears calm and comfortable and is NAD Eyes:  PERRL, EOMI, normal lids,  iris ENT:  grossly normal hearing, lips & tongue, mmm Neck:  no LAD, masses or thyromegaly Cardiovascular:  RRR, no m/r/g. No LE edema.  Respiratory:  CTA bilaterally, no w/r/r. Normal respiratory effort. Abdomen:  soft, ntnd, NABS Skin:  no rash or induration seen on limited exam Musculoskeletal:  grossly normal tone BUE/BLE, good ROM, no bony abnormality Psychiatric:  grossly normal mood and affect, speech fluent and appropriate, AOx3 Neurologic:  CN 2-12 grossly intact, moves all extremities in coordinated fashion, sensation intact  Labs on Admission: I have personally reviewed following labs and imaging studies  CBC:  Recent Labs Lab 09/24/16 1344  WBC 9.6  NEUTROABS 6.5  HGB 13.6  HCT 41.3  MCV 87.3  PLT 232   Basic Metabolic Panel:  Recent Labs Lab 09/24/16 1344  NA 137  K 3.9  CL 102  CO2 26  GLUCOSE 200*  BUN 22*  CREATININE 1.16  CALCIUM 9.9   GFR: Estimated Creatinine Clearance: 93.2 mL/min (by C-G formula based on SCr of 1.16 mg/dL). Liver Function Tests:  Recent Labs Lab 09/24/16 1344  AST 25  ALT 33  ALKPHOS 34*  BILITOT 0.7  PROT 8.1  ALBUMIN 4.5   No results for input(s): LIPASE, AMYLASE in the last 168 hours. No results for input(s): AMMONIA in the last 168 hours. Coagulation Profile: No results for input(s): INR, PROTIME in the last 168 hours. Cardiac Enzymes:  Recent Labs Lab 09/24/16 1344  TROPONINI <0.03   BNP (last 3 results) No results for input(s): PROBNP in the last 8760 hours. HbA1C: No results for input(s): HGBA1C in the last 72 hours. CBG: No results for input(s): GLUCAP in the last 168 hours. Lipid Profile: No results for input(s): CHOL, HDL, LDLCALC, TRIG, CHOLHDL, LDLDIRECT in the last 72 hours. Thyroid Function Tests: No results for input(s): TSH, T4TOTAL, FREET4, T3FREE, THYROIDAB in the last 72 hours. Anemia Panel: No results for input(s): VITAMINB12, FOLATE, FERRITIN, TIBC, IRON, RETICCTPCT in the last 72  hours. Urine analysis:    Component Value Date/Time   COLORURINE YELLOW 04/23/2007 1148   APPEARANCEUR CLEAR 04/23/2007 1148   LABSPEC 1.031 (H) 04/23/2007 1148   PHURINE 5.5 04/23/2007 1148   GLUCOSEU >1000 (A) 04/23/2007 1148   HGBUR SMALL (A) 04/23/2007 1148   BILIRUBINUR NEGATIVE 04/23/2007 1148   KETONESUR NEGATIVE 04/23/2007 1148   PROTEINUR NEGATIVE 04/23/2007 1148   UROBILINOGEN 0.2 04/23/2007 1148   NITRITE NEGATIVE 04/23/2007 1148   LEUKOCYTESUR NEGATIVE 04/23/2007 1148    Creatinine Clearance: Estimated Creatinine Clearance: 93.2 mL/min (by C-G formula based on SCr of 1.16 mg/dL).  Sepsis Labs: @LABRCNTIP (procalcitonin:4,lacticidven:4) )No results found for this or any previous visit (from the past 240 hour(s)).   Radiological Exams on Admission: Dg Chest 2 View  Result Date: 09/24/2016 CLINICAL DATA:  Gradual onset of chest pain EXAM: CHEST  2 VIEW COMPARISON:  08/20/2007 FINDINGS: Mild cardiomegaly. Status post CABG. Sheet like calcification along the inferior left ventricle. There is no edema, consolidation, effusion, or pneumothorax. IMPRESSION: 1. No acute finding. 2. Inferior left ventricular calcification which could reflect a LV aneurysm. Recommend follow-up. 3. CABG and chronic cardiomegaly. Electronically Signed   By: Marnee SpringJonathon  Watts M.D.   On: 09/24/2016 14:15    EKG: Independently reviewed.  NSR with rate 77; nonspecific ST changes (t-wave inversion in III and aVR) with no evidence of acute ischemia  Assessment/Plan Principal Problem:   Chest pain Active Problems:   Persistent atrial fibrillation (HCC)   CAD (coronary artery disease)   HTN (hypertension)   Mixed hyperlipidemia   DM type 2 causing vascular disease (HCC)   Long term current use of anticoagulant therapy   Chest pain -Patient with substernal chest pressure that came on with exertion and was relieved with rest and NTG. -3/3 typical symptoms suggestive of anginal  chest pain.  -Marked  h/o premature CAD without recent stress testing or cath. -Currently chest pain free. -CXR shows possible LV aneurysm. -Initial cardiac troponin negative.  -EKG is somewhat concerning for ischemia, but negative for STEMI.   -GRACE score is 119; which predicts an in-hospital death rate of 1.6%.  -Will plan to place in observation status on telemetry to rule out ACS by overnight observation.  -cycle troponin q6h x 3 and repeat EKG in AM -Start ASA 81 mg  daily -morphine given -Risk factor stratification with HgbA1c and FLP; will also check TSH and UDS -Cardiology consultation briefly tonight, full consult in AM - NPO for possible stress test/cath  -Will plan to start Heparin drip in AM after allowing for metabolism of Xarelto -We discussed the option of transferring to [facility], particularly since his cardiologist is there and he may need cardiac catheterization.  However, he prefers to remain at [facility] for tonight and if transfer is needed overnight or in the AM he will transfer at that time.  HTN -Takes Bystolic and Losartan at home -Patient with suboptimal control while in the ER. -May need prn hydralazine  HLD -Continue Lipitor but increase to 80 mg qhs for now -Lipids were checked recently by patient report but this is not available to [place] so will recheck  DM -Glucose 200 -Taking Amaryl, Januvia, and Metformin -Last A1c unavailable, will check -Will cover with SSI for now -If his A1c is not at goal despite triple oral therapy, he likely needs basal (and bolus) insulin -Creatinine 1.16, prior 0.74 in 3/17; possibly progression of CKD related to suboptimal control of DM (and HTN?)  Afib on anticoagulation -He is taking Tikosyn as well as Xarelto -Continue Tikosyn for now -Hold Xarelto while on heparin drip -Adequately rate controlled -CHA2DS2-VASc score is 3, stroke rate of 3.2%/year   DVT prophylaxis: Xarelto transitioning to Heparin drip Code Status:  Full - confirmed with  patient Family Communication: None present Disposition Plan:  Home once clinically improved Consults called: Cardiology  Admission status: It is my clinical opinion that referral for OBSERVATION is reasonable and necessary in this patient based on the above information provided. The aforementioned taken together are felt to place the patient at high risk for further clinical deterioration. However it is anticipated that the patient may be medically stable for discharge from the hospital within 24 to 48 hours.    Jonah Blue MD [facility]  If 7PM-7AM, please contact night-coverage [email or web address] Password [email or web address]  09/24/2016, 8:25 PM

## 2016-09-24 NOTE — ED Provider Notes (Signed)
[facility]-EMERGENCY DEPT Provider Note   CSN: [id number] Arrival date & time: 09/24/16  1321     History   Chief Complaint Chief Complaint  Patient presents with  . Chest Pain    HPI [name] is a 56 y.o. male.  HPI Patient presents to the emergency room for evaluation of chest pain. Patient has history of atrial fibrillation and coronary artery disease. She states he has a remote history of coronary artery bypass grafting. He has been symptom-free from a cardiac standpoint until this episode this afternoon. 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 It radiated to his back and to his jaw. He felt dizzy and lightheaded. He also felt nauseated and somewhat short of breath. Patient drove himself to the ED. Symptoms have been slowly improving. He only has a mild discomfort at this point. He denies any fevers or cough. No leg swelling. Past Medical History:  Diagnosis Date  . Abdominal distention   . Anemia    Postoperative  . Coronary artery disease   . Diabetes (HCC)   . Edema   . H/O gastroesophageal reflux (GERD)   . High cholesterol   . Hypertension   . Myocardial infarction   . Obesity   . Persistent atrial fibrillation (HCC)   . Pleural effusion    resolved with therapy    Patient Active Problem List   Diagnosis Date Noted  . Long term current use of anticoagulant therapy 02/05/2016  . Encounter for monitoring dofetilide therapy 02/05/2016  . Chronic diastolic heart failure (HCC) 06/19/2015  . Persistent atrial fibrillation (HCC) 03/31/2014  . CAD (coronary artery disease) 03/31/2014  . HTN (hypertension) 03/31/2014  . Mixed hyperlipidemia 03/31/2014  . DM type 2 causing vascular disease (HCC) 03/31/2014  . Obesity (BMI 30-39.9) 03/31/2014  . GASTROESOPHAGEAL REFLUX DISEASE 02/27/2009  . ANEMIA, IRON DEFICIENCY, HX OF 02/27/2009    Past Surgical History:  Procedure Laterality Date  .  CARDIAC CATHETERIZATION  2008  . CORONARY ANGIOPLASTY    . CORONARY ARTERY BYPASS GRAFT     X4  . exercise cardiolite myocardial perfusion study         Home Medications    Prior to Admission medications   Medication Sig Start Date End Date Taking? Authorizing Provider  ACCU-CHEK AVIVA PLUS test strip  02/08/14  Yes Historical Provider, MD  atorvastatin (LIPITOR) 40 MG tablet Take 40 mg by mouth daily at 6 PM. Generic only 03/26/14  Yes Historical Provider, MD  BYSTOLIC 10 MG tablet Take 5 mg by mouth every morning.  02/08/14  Yes Historical Provider, MD  ferrous sulfate 325 (65 FE) MG tablet Take 325 mg by mouth daily.   Yes Historical Provider, MD  furosemide (LASIX) 40 MG tablet Take 1-2 tablets by mouth daily as needed for fluid 03/26/14  Yes Historical Provider, MD  glimepiride (AMARYL) 2 MG tablet Take 1 tablet by mouth daily. 01/08/16  Yes Historical Provider, MD  HYDROcodone-acetaminophen (NORCO) 10-325 MG tablet Take 1 tablet by mouth as directed. 09/04/15  Yes Historical Provider, MD  JANUVIA 100 MG tablet Take 100 mg by mouth daily.  02/05/14  Yes Historical Provider, MD  Lancets (ACCU-CHEK MULTICLIX) lancets  02/08/14  Yes Historical Provider, MD  losartan (COZAAR) 50 MG tablet Take 25 mg by mouth daily.  02/08/14  Yes Historical Provider, MD  metFORMIN (GLUCOPHAGE) 1000 MG tablet Take 1,000 mg by mouth 2 (two) times daily with  a meal.  01/17/14  Yes Historical Provider, MD  Multiple Vitamins-Minerals (MULTIVITAMIN WITH MINERALS) tablet Take 1 tablet by mouth 2 (two) times daily.    Yes Historical Provider, MD  Omeprazole-Sodium Bicarbonate (ZEGERID OTC PO) Take 1 capsule by mouth daily.   Yes Historical Provider, MD  potassium chloride SA (K-DUR,KLOR-CON) 20 MEQ tablet Take 10 mEq by mouth daily.    Yes Historical Provider, MD  TIKOSYN 500 MCG capsule TAKE 1 CAPSULE (500 MCG TOTAL) BY MOUTH 2 (TWO) TIMES DAILY. CONSULT MD BEFORE TAKING ANY NEW MEDICATIONS. REPORT SIDE EFFECTS TO MD.  09/02/16  Yes Mihai Croitoru, MD  XARELTO 20 MG TABS tablet Take 1 tablet by mouth daily. 04/22/14  Yes Historical Provider, MD    Family History History reviewed. No pertinent family history.  Social History Social History  Substance Use Topics  . Smoking status: Former Smoker    Packs/day: 2.00    Years: 30.00    Types: Cigarettes  . Smokeless tobacco: Never Used  . Alcohol use 0.6 oz/week    1 Standard drinks or equivalent per week     Allergies   Patient has no known allergies.   Review of Systems Review of Systems  All other systems reviewed and are negative.    Physical Exam Updated Vital Signs BP 127/81   Pulse 74   Temp 98 F (36.7 C) (Oral)   Resp 18   Ht 6' (1.829 m)   Wt 109.8 kg   SpO2 96%   BMI 32.82 kg/m   Physical Exam  Constitutional: He appears well-developed and well-nourished. No distress.  HENT:  Head: Normocephalic and atraumatic.  Right Ear: External ear normal.  Left Ear: External ear normal.  Eyes: Conjunctivae are normal. Right eye exhibits no discharge. Left eye exhibits no discharge. No scleral icterus.  Neck: Neck supple. No tracheal deviation present.  Cardiovascular: Normal rate, regular rhythm and intact distal pulses.   Pulmonary/Chest: Effort normal and breath sounds normal. No stridor. No respiratory distress. He has no wheezes. He has no rales.  Abdominal: Soft. Bowel sounds are normal. He exhibits no distension. There is no tenderness. There is no rebound and no guarding.  Musculoskeletal: He exhibits no edema or tenderness.  Neurological: He is alert. He has normal strength. No cranial nerve deficit (no facial droop, extraocular movements intact, no slurred speech) or sensory deficit. He exhibits normal muscle tone. He displays no seizure activity. Coordination normal.  Skin: Skin is warm and dry. No rash noted.  Psychiatric: He has a normal mood and affect.  Nursing note and vitals reviewed.    ED Treatments / Results    Labs (all labs ordered are listed, but only abnormal results are displayed) Labs Reviewed  COMPREHENSIVE METABOLIC PANEL - Abnormal; Notable for the following:       Result Value   Glucose, Bld 200 (*)    BUN 22 (*)    Alkaline Phosphatase 34 (*)    All other components within normal limits  CBC WITH DIFFERENTIAL/PLATELET  TROPONIN I    EKG  EKG Interpretation  Date/Time:  Tuesday September 24 2016 13:41:08 EDT Ventricular Rate:  79 PR Interval:    QRS Duration: 99 QT Interval:  392 QTC Calculation: 450 R Axis:   107 Text Interpretation:  duplicate EKG, discard Confirmed by Ayame Rena  MD-J, Andie Mortimer ([id number]) on 09/24/2016 3:17:08 PM       Radiology Dg Chest 2 View  Result Date: 09/24/2016 CLINICAL DATA:  Gradual onset of chest  pain EXAM: CHEST  2 VIEW COMPARISON:  08/20/2007 FINDINGS: Mild cardiomegaly. Status post CABG. Sheet like calcification along the inferior left ventricle. There is no edema, consolidation, effusion, or pneumothorax. IMPRESSION: 1. No acute finding. 2. Inferior left ventricular calcification which could reflect a LV aneurysm. Recommend follow-up. 3. CABG and chronic cardiomegaly. Electronically Signed   By: Marnee Spring M.D.   On: 09/24/2016 14:15    Procedures Procedures (including critical care time)  Medications Ordered in ED Medications  nitroGLYCERIN (NITROSTAT) SL tablet 0.4 mg (0.4 mg Sublingual Given 09/24/16 1420)  aspirin chewable tablet 324 mg (324 mg Oral Given 09/24/16 1408)     Initial Impression / Assessment and Plan / ED Course  I have reviewed the triage vital signs and the nursing notes.  Pertinent labs & imaging results that were available during my care of the patient were reviewed by me and considered in my medical decision making (see chart for details).  Clinical Course as of Sep 24 1529  Tue Sep 24, 2016  1530 Patient is pain-free. No complaints at this point. The nitroglycerin seemed to resolve symptoms completely  [JK]     Clinical Course User Index [JK] Linwood Dibbles, MD  patient presented to the emergency room with complaints of chest pain concerning for unstable angina. He does have history of coronary artery disease symptoms for many years. Initial troponin is reassuring and his chest pain has resolved. However considering his cardiac history and the symptoms today I'll consult the medical service for admission, possible cardiology consultation and further evaluation.  Final Clinical Impressions(s) / ED Diagnoses   Final diagnoses:  Unstable angina [facility])    New Prescriptions    Linwood Dibbles, MD 09/24/16 1531

## 2016-09-24 NOTE — ED Triage Notes (Signed)
PT c/o central chest pressure that started x45 min ago that radiated to his jaw and through to his back with diaphoresis and nausea with SOB. PT states pain has decreased to a 4 at this time. PT states hx of stents and CABG x6 years ago.

## 2016-09-24 NOTE — ED Notes (Signed)
Patient given crackers and drink. 

## 2016-09-24 NOTE — ED Notes (Signed)
ED Provider at bedside. 

## 2016-09-25 ENCOUNTER — Encounter (HOSPITAL_COMMUNITY)
Admission: EM | Disposition: A | Discharge status: Home / Self Care | Payer: Self-pay | Source: Home / Self Care | Attending: Emergency Medicine

## 2016-09-25 DIAGNOSIS — I25110 Atherosclerotic heart disease of native coronary artery with unstable angina pectoris: Secondary | ICD-10-CM | POA: Diagnosis not present

## 2016-09-25 DIAGNOSIS — Z7901 Long term (current) use of anticoagulants: Secondary | ICD-10-CM | POA: Diagnosis not present

## 2016-09-25 DIAGNOSIS — E1159 Type 2 diabetes mellitus with other circulatory complications: Secondary | ICD-10-CM | POA: Diagnosis not present

## 2016-09-25 DIAGNOSIS — I25710 Atherosclerosis of autologous vein coronary artery bypass graft(s) with unstable angina pectoris: Secondary | ICD-10-CM | POA: Diagnosis not present

## 2016-09-25 DIAGNOSIS — I110 Hypertensive heart disease with heart failure: Secondary | ICD-10-CM | POA: Diagnosis not present

## 2016-09-25 DIAGNOSIS — I10 Essential (primary) hypertension: Secondary | ICD-10-CM | POA: Diagnosis not present

## 2016-09-25 DIAGNOSIS — I200 Unstable angina: Secondary | ICD-10-CM | POA: Diagnosis not present

## 2016-09-25 DIAGNOSIS — I481 Persistent atrial fibrillation: Secondary | ICD-10-CM | POA: Diagnosis not present

## 2016-09-25 DIAGNOSIS — I252 Old myocardial infarction: Secondary | ICD-10-CM | POA: Diagnosis not present

## 2016-09-25 HISTORY — PX: LEFT HEART CATH AND CORS/GRAFTS ANGIOGRAPHY: CATH118250

## 2016-09-25 LAB — GLUCOSE, CAPILLARY
Glucose-Capillary: 186 mg/dL — ABNORMAL HIGH (ref 65–99)
Glucose-Capillary: 160 mg/dL — ABNORMAL HIGH (ref 65–99)
Glucose-Capillary: 142 mg/dL — ABNORMAL HIGH (ref 65–99)

## 2016-09-25 LAB — RAPID URINE DRUG SCREEN, HOSP PERFORMED
AMPHETAMINES: NOT DETECTED
BENZODIAZEPINES: NOT DETECTED
Barbiturates: NOT DETECTED
Cocaine: NOT DETECTED
Opiates: POSITIVE — AB
Tetrahydrocannabinol: NOT DETECTED

## 2016-09-25 LAB — CBC
HCT: 38 % — ABNORMAL LOW (ref 39.0–52.0)
Hemoglobin: 12.3 g/dL — ABNORMAL LOW (ref 13.0–17.0)
MCH: 28.3 pg (ref 26.0–34.0)
MCHC: 32.4 g/dL (ref 30.0–36.0)
MCV: 87.4 fL (ref 78.0–100.0)
Platelets: 187 10*3/uL (ref 150–400)
RBC: 4.35 MIL/uL (ref 4.22–5.81)
RDW: 13.6 % (ref 11.5–15.5)
WBC: 12.7 10*3/uL — AB (ref 4.0–10.5)

## 2016-09-25 LAB — APTT
aPTT: 32 seconds (ref 24–36)
aPTT: 33 seconds (ref 24–36)

## 2016-09-25 LAB — TROPONIN I

## 2016-09-25 LAB — POCT ACTIVATED CLOTTING TIME: Activated Clotting Time: 136 seconds

## 2016-09-25 LAB — HEPARIN LEVEL (UNFRACTIONATED): Heparin Unfractionated: 0.49 IU/mL (ref 0.30–0.70)

## 2016-09-25 SURGERY — LEFT HEART CATH AND CORS/GRAFTS ANGIOGRAPHY
Anesthesia: LOCAL

## 2016-09-25 MED ORDER — ACETAMINOPHEN 325 MG PO TABS: 650.0000 mg | ORAL_TABLET | ORAL | Status: DC | PRN

## 2016-09-25 MED ORDER — IOPAMIDOL (ISOVUE-370) INJECTION 76%
INTRAVENOUS | Status: AC
  Filled 2016-09-25: qty 125

## 2016-09-25 MED ORDER — VERAPAMIL HCL 2.5 MG/ML IV SOLN
INTRAVENOUS | Status: AC
  Filled 2016-09-25: qty 2

## 2016-09-25 MED ORDER — SODIUM CHLORIDE 0.9 % IV SOLN: INTRAVENOUS | Status: AC

## 2016-09-25 MED ORDER — FENTANYL CITRATE (PF) 100 MCG/2ML IJ SOLN
INTRAMUSCULAR | Status: AC
  Filled 2016-09-25: qty 2

## 2016-09-25 MED ORDER — HEPARIN (PORCINE) IN NACL 2-0.9 UNIT/ML-% IJ SOLN
INTRAMUSCULAR | Status: AC
  Filled 2016-09-25: qty 500

## 2016-09-25 MED ORDER — SODIUM CHLORIDE 0.9% FLUSH: 3.0000 mL | Freq: Two times a day (BID) | INTRAVENOUS | Status: DC

## 2016-09-25 MED ORDER — SODIUM CHLORIDE 0.9 % IV SOLN: 250.0000 mL | INTRAVENOUS | Status: DC | PRN

## 2016-09-25 MED ORDER — ASPIRIN 81 MG PO CHEW: 81.0000 mg | CHEWABLE_TABLET | ORAL | Status: DC

## 2016-09-25 MED ORDER — HEPARIN (PORCINE) IN NACL 2-0.9 UNIT/ML-% IJ SOLN
INTRAMUSCULAR | Status: DC | PRN
  Administered 2016-09-25: 1000 mL

## 2016-09-25 MED ORDER — MIDAZOLAM HCL 2 MG/2ML IJ SOLN
INTRAMUSCULAR | Status: DC | PRN
  Administered 2016-09-25: 2 mg via INTRAVENOUS
  Administered 2016-09-25: 1 mg via INTRAVENOUS

## 2016-09-25 MED ORDER — LIDOCAINE HCL (PF) 1 % IJ SOLN
INTRAMUSCULAR | Status: AC
  Filled 2016-09-25: qty 30

## 2016-09-25 MED ORDER — IOPAMIDOL (ISOVUE-370) INJECTION 76%
INTRAVENOUS | Status: DC | PRN
  Administered 2016-09-25: 115 mL via INTRA_ARTERIAL

## 2016-09-25 MED ORDER — SODIUM CHLORIDE 0.9% FLUSH: 3.0000 mL | INTRAVENOUS | Status: DC | PRN

## 2016-09-25 MED ORDER — RIVAROXABAN 20 MG PO TABS
20.0000 mg | ORAL_TABLET | Freq: Every day | ORAL | Status: DC
  Administered 2016-09-26: 20 mg via ORAL
  Filled 2016-09-25: qty 1

## 2016-09-25 MED ORDER — MIDAZOLAM HCL 2 MG/2ML IJ SOLN
INTRAMUSCULAR | Status: AC
  Filled 2016-09-25: qty 2

## 2016-09-25 MED ORDER — LIDOCAINE HCL (PF) 1 % IJ SOLN
INTRAMUSCULAR | Status: DC | PRN
  Administered 2016-09-25: 20 mL

## 2016-09-25 MED ORDER — ONDANSETRON HCL 4 MG/2ML IJ SOLN: 4.0000 mg | Freq: Four times a day (QID) | INTRAMUSCULAR | Status: DC | PRN

## 2016-09-25 MED ORDER — SODIUM CHLORIDE 0.9 % WEIGHT BASED INFUSION
3.0000 mL/kg/h | INTRAVENOUS | Status: DC
  Administered 2016-09-25: 3 mL/kg/h via INTRAVENOUS

## 2016-09-25 MED ORDER — FENTANYL CITRATE (PF) 100 MCG/2ML IJ SOLN
INTRAMUSCULAR | Status: DC | PRN
  Administered 2016-09-25 (×2): 25 ug via INTRAVENOUS

## 2016-09-25 MED ORDER — SODIUM CHLORIDE 0.9% FLUSH
3.0000 mL | Freq: Two times a day (BID) | INTRAVENOUS | Status: DC
  Administered 2016-09-25: 3 mL via INTRAVENOUS

## 2016-09-25 MED ORDER — SODIUM CHLORIDE 0.9 % WEIGHT BASED INFUSION: 1.0000 mL/kg/h | INTRAVENOUS | Status: DC

## 2016-09-25 SURGICAL SUPPLY — 12 items
CATH INFINITI 5 FR IM (CATHETERS) ×2 IMPLANT
CATH INFINITI 5 FR RCB (CATHETERS) ×2 IMPLANT
CATH INFINITI 5FR MULTPACK ANG (CATHETERS) ×2 IMPLANT
KIT ENCORE 26 ADVANTAGE (KITS) ×2 IMPLANT
KIT HEART LEFT (KITS) ×4 IMPLANT
PACK CARDIAC CATHETERIZATION (CUSTOM PROCEDURE TRAY) ×2 IMPLANT
SHEATH PINNACLE 5F 10CM (SHEATH) ×2 IMPLANT
SYR MEDRAD MARK V 150ML (SYRINGE) ×2 IMPLANT
TRANSDUCER W/STOPCOCK (MISCELLANEOUS) ×2 IMPLANT
TUBING CIL FLEX 10 FLL-RA (TUBING) ×2 IMPLANT
WIRE EMERALD 3MM-J .035X150CM (WIRE) ×2 IMPLANT
WIRE EMERALD 3MM-J .035X260CM (WIRE) ×2 IMPLANT

## 2016-09-25 NOTE — Progress Notes (Signed)
Pt left floor for cath lab with no signs of distress. IV fluids stopped and telemetry off.

## 2016-09-25 NOTE — Consult Note (Signed)
Cardiology Consultation   Patient ID: [name]; [id number]; 10/17/1960   Admit date: 09/24/2016 Date of Consult: 09/25/2016  Referring MD:  Dr. Rinaldo RatelKadolph Cardiologist: Dr. Royann Shiversroitoru Consulting Cardiologist: Dr. Wyline MoodBranch  Patient Care Team: Gareth MorganSteve Knowlton, MD as PCP - General (Family Medicine) Ranee Gosselinonald Gioffre, MD as Consulting Physician (Orthopedic Surgery)    Reason for Consultation: unstable angina   History of Present Illness: [name] KocherOtha B Gjerde is a 56 y.o. male with a hx of CAD and atrial fibrillation on Tikosyn and Xarelto, CHADSVASC=3, DM type 2, HTN, HLD, GERD. He had MI treated with BMS LAD age 56, DES RCA and PLA 2007 and DES x 2 mid LAD. CABG x 4 2008 LIMA-LAD, SVG-Dx, SVG-OM1, SVG-PDA. Last 2Decho 2015 normal LVEF 50-55% mild MR.  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 He initially thought it was indigestion but it got so severe he came to ER where it already started to ease but left completely with NTG. Hasn't had any angina since CABG but doesn't think he ever got back to his "normal" since CABG and says he'll never have it again. It's a little different from his previous angina that was more of a shortness of breath and sensation of not being able to get a deep breath.EKG without acute change and troponins negative.  Past Medical History:  Diagnosis Date  . Abdominal distention   . Anemia    Postoperative  . Chronic diastolic CHF (congestive heart failure) (HCC)   . Coronary artery disease    a. premature - acute MI age 56 s/p BMS to prox LAD. b. s/p DES to RCA, PLA, mLAD in 2007. c. 4V CABG in 2008 with LIMA to LAD, SVG to first diagonal, SVG to first OM, SVG to PDA  . Diabetes (HCC)   . Edema   . H/O gastroesophageal reflux (GERD)   . High cholesterol   . Hypertension   . Myocardial infarction   . Obesity   . Persistent atrial fibrillation (HCC)     a. on Tikosyn, Xarelto.  . Pleural effusion    resolved with therapy    Past Surgical History:  Procedure Laterality Date  . CARDIAC CATHETERIZATION  2008  . CORONARY ANGIOPLASTY    . CORONARY ARTERY BYPASS GRAFT     X4  . exercise cardiolite myocardial perfusion study        Home Meds: Prior to Admission medications   Medication Sig Start Date End Date Taking? Authorizing Provider  ACCU-CHEK AVIVA PLUS test strip  02/08/14  Yes Historical Provider, MD  atorvastatin (LIPITOR) 40 MG tablet Take 40 mg by mouth daily at 6 PM. Generic only 03/26/14  Yes Historical Provider, MD  BYSTOLIC 10 MG tablet Take 5 mg by mouth every morning.  02/08/14  Yes Historical Provider, MD  ferrous sulfate 325 (65 FE) MG tablet Take 325 mg by mouth daily.   Yes Historical Provider, MD  furosemide (LASIX) 40 MG tablet Take 1-2 tablets by mouth daily as needed for fluid 03/26/14  Yes Historical Provider, MD  glimepiride (AMARYL) 2 MG tablet Take 1 tablet by mouth daily. 01/08/16  Yes Historical Provider, MD  HYDROcodone-acetaminophen (NORCO) 10-325 MG tablet Take 1 tablet by mouth as directed. 09/04/15  Yes Historical Provider, MD  JANUVIA 100 MG tablet Take 100 mg by mouth daily.  02/05/14  Yes Historical Provider, MD  Lancets (ACCU-CHEK MULTICLIX) lancets  02/08/14  Yes Historical Provider, MD  losartan (COZAAR) 50 MG tablet Take 25 mg by mouth daily.  02/08/14  Yes Historical Provider, MD  metFORMIN (GLUCOPHAGE) 1000 MG tablet Take 1,000 mg by mouth 2 (two) times daily with a meal.  01/17/14  Yes Historical Provider, MD  Multiple Vitamins-Minerals (MULTIVITAMIN WITH MINERALS) tablet Take 1 tablet by mouth 2 (two) times daily.    Yes Historical Provider, MD  Omeprazole-Sodium Bicarbonate (ZEGERID OTC PO) Take 1 capsule by mouth daily.   Yes Historical Provider, MD  potassium chloride SA (K-DUR,KLOR-CON) 20 MEQ tablet Take 10 mEq by mouth daily.    Yes Historical Provider, MD  TIKOSYN 500 MCG capsule TAKE 1 CAPSULE  (500 MCG TOTAL) BY MOUTH 2 (TWO) TIMES DAILY. CONSULT MD BEFORE TAKING ANY NEW MEDICATIONS. REPORT SIDE EFFECTS TO MD. 09/02/16  Yes Mihai Croitoru, MD  XARELTO 20 MG TABS tablet Take 1 tablet by mouth daily. 04/22/14  Yes Historical Provider, MD    Current Medications: . aspirin EC  81 mg Oral Daily  . atorvastatin  80 mg Oral q1800  . dofetilide  500 mcg Oral BID  . insulin aspart  0-15 Units Subcutaneous TID WC  . insulin aspart  0-5 Units Subcutaneous QHS  . losartan  25 mg Oral Daily  . nebivolol  5 mg Oral Daily  . pantoprazole sodium  40 mg Oral Daily     Allergies:   No Known Allergies  Social History:   The patient  reports that he has quit smoking. His smoking use included Cigarettes. He has a 60.00 pack-year smoking history. He has never used smokeless tobacco. He reports that he drinks about 0.6 oz of alcohol per week . He reports that he does not use drugs.    Family History:   The patient's family history includes CAD in his father; Vascular Disease in his father.   ROS:  Please see the history of present illness.  Review of Systems  Constitution: Positive for weakness and malaise/fatigue.  HENT: Negative.   Cardiovascular: Positive for chest pain and irregular heartbeat.  Respiratory: Negative.   Endocrine: Negative.   Hematologic/Lymphatic: Negative.   Musculoskeletal: Negative.   Gastrointestinal: Negative.   Genitourinary: Negative.    All other ROS reviewed and negative.      Vital Signs: Blood pressure 112/75, pulse 71, temperature 98 F (36.7 C), temperature source Oral, resp. rate 20, height 6' (1.829 m), weight 247 lb 11.2 oz (112.4 kg), SpO2 98 %.   PHYSICAL EXAM: General:  Well nourished, well developed, in no acute distress  HEENT: normal Lymph: no adenopathy Neck: no JVD Endocrine:  No thryomegaly Vascular: No carotid bruits; FA pulses 2+ bilaterally without bruits  Cardiac:  RRR; normal S1, S2; 1/6 systolic murmur LSB,no rub, bruit, thrill, or  heave Lungs:  Decreased breath sounds but clear to auscultation bilaterally, no wheezing, rhonchi or rales  Abd: soft, nontender, no hepatomegaly  Ext: no edema, Good distal pulses bilaterally Musculoskeletal:  No deformities, BUE and BLE strength normal and equal Skin: warm and dry  Neuro:  CNs 2-12 intact, no focal abnormalities noted Psych:  Normal affect    EKG:  NSR without acute findings  Telemetry: NSR  Labs:  Recent Labs  09/24/16 1344 09/24/16 2040 09/25/16 0145 09/25/16 0722  TROPONINI <0.03 <0.03 <0.03 <0.03   Lab Results  Component Value Date   WBC 9.6 09/24/2016   HGB 13.6 09/24/2016   HCT 41.3 09/24/2016   MCV 87.3 09/24/2016   PLT  232 09/24/2016    Recent Labs Lab 09/24/16 1344  NA 137  K 3.9  CL 102  CO2 26  BUN 22*  CREATININE 1.16  CALCIUM 9.9  PROT 8.1  BILITOT 0.7  ALKPHOS 34*  ALT 33  AST 25  GLUCOSE 200*   Lab Results  Component Value Date   CHOL 91 09/24/2016   HDL 23 (L) 09/24/2016   LDLCALC 49 09/24/2016   TRIG 97 09/24/2016   No results found for: DDIMER  Radiology/Studies:  Dg Chest 2 View  Result Date: 09/24/2016 CLINICAL DATA:  Gradual onset of chest pain EXAM: CHEST  2 VIEW COMPARISON:  08/20/2007 FINDINGS: Mild cardiomegaly. Status post CABG. Sheet like calcification along the inferior left ventricle. There is no edema, consolidation, effusion, or pneumothorax. IMPRESSION: 1. No acute finding. 2. Inferior left ventricular calcification which could reflect a LV aneurysm. Recommend follow-up. 3. CABG and chronic cardiomegaly. Electronically Signed   By: Marnee Spring M.D.   On: 09/24/2016 14:15     PROBLEM LIST:  Principal Problem:   Chest pain Active Problems:   Persistent atrial fibrillation (HCC)   CAD (coronary artery disease)   HTN (hypertension)   Mixed hyperlipidemia   DM type 2 causing vascular disease (HCC)   Long term current use of anticoagulant therapy     ASSESSMENT AND PLAN:  Chest pain  worrisome for ischemia relieved with NTG and on IV heparin. Recommend cardiac cath. Patient states he never wants CABG again because of complications in past. Last Xarelto dose yesterday 9:30 am.  CAD S/P multiple stents and CABG x 4 2008-see above for details  Atrial fibrillation on Tikosyn and Xarelto maintaining NSR  DM type 2  HTN controlled on coazzr and bystolic  HLD controlled on lipitor     Signed, Jacolyn Reedy, [name]  09/25/2016 8:45 AM   Attending note Patient seen and discussed with PA Geni Bers, I agree with her documentation above. 56 yo male with medical history as outlined above, including CAD with previous stents and CABG presents with chest pain. EKG and enzymes are not consistent with ACS at this time. Current medical therapy with ASA, atorva 80, hep gtt, losartan 25, bystoilc 5.   Patient with very strong CAD history, has not had any form of ischemic evaluation since his CABG in 2008. Presents with sudden chest pressure 8/10 in midchest with SOB and diaphoresis, radiating to back under left shoulder blade and left jaw, better with NG. Overall fairly good story for angina. Given his strong history and strong story we will plan for a definitive cath to evaluate his coronary anatomy. We will arrange transfer to [facility] today.   Dina Rich MD

## 2016-09-25 NOTE — Interval H&P Note (Signed)
Cath Lab Visit (complete for each Cath Lab visit)  Clinical Evaluation Leading to the Procedure:   ACS: Yes.    Non-ACS:    Anginal Classification: CCS IV  Anti-ischemic medical therapy: Minimal Therapy (1 class of medications)  Non-Invasive Test Results: No non-invasive testing performed  Prior CABG: Previous CABG      History and Physical Interval Note:  09/25/2016 4:37 PM  [name]  has presented today for surgery, with the diagnosis of cp  The various methods of treatment have been discussed with the patient and family. After consideration of risks, benefits and other options for treatment, the patient has consented to  Procedure(s): Left Heart Cath and Cors/Grafts Angiography (N/A) as a surgical intervention .  The patient's history has been reviewed, patient examined, no change in status, stable for surgery.  I have reviewed the patient's chart and labs.  Questions were answered to the patient's satisfaction.     Lance MussJayadeep Sephira Zellman

## 2016-09-25 NOTE — Progress Notes (Signed)
[facility]  [facility] aware and in transport.  Report called to receiving RN - Chama. Patient and family aware and ready for transfer.

## 2016-09-25 NOTE — Progress Notes (Signed)
455fr sheath removed intact from right groin. Manual pressure held x6620min. No bleeding or hematoma noted. 4x4 dressing applied with tegaderm. Post instructions explained to patient; verbalized understanding. Transported via bed to 2west. Mamie LeversBaldwin, Jamileth Putzier E

## 2016-09-25 NOTE — Progress Notes (Signed)
Pt  Has arrived to the floor. No signs of distress noted.

## 2016-09-25 NOTE — Progress Notes (Signed)
Pt has arrived to floor from cath lab without distress. Rt femoral site Clean dry and intact. Pt has no complaints of pain and is asking for food. Will continue to monitor.

## 2016-09-25 NOTE — H&P (View-Only) (Signed)
Cardiology Consultation   Patient ID: [name]; [id number]; 10/17/1960   Admit date: 09/24/2016 Date of Consult: 09/25/2016  Referring MD:  Dr. Rinaldo RatelKadolph Cardiologist: Dr. Royann Shiversroitoru Consulting Cardiologist: Dr. Wyline MoodBranch  Patient Care Team: Gareth MorganSteve Knowlton, MD as PCP - General (Family Medicine) Ranee Gosselinonald Gioffre, MD as Consulting Physician (Orthopedic Surgery)    Reason for Consultation: unstable angina   History of Present Illness: [name] KocherOtha B Gjerde is a 56 y.o. male with a hx of CAD and atrial fibrillation on Tikosyn and Xarelto, CHADSVASC=3, DM type 2, HTN, HLD, GERD. He had MI treated with BMS LAD age 56, DES RCA and PLA 2007 and DES x 2 mid LAD. CABG x 4 2008 LIMA-LAD, SVG-Dx, SVG-OM1, SVG-PDA. Last 2Decho 2015 normal LVEF 50-55% mild MR.  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 He initially thought it was indigestion but it got so severe he came to ER where it already started to ease but left completely with NTG. Hasn't had any angina since CABG but doesn't think he ever got back to his "normal" since CABG and says he'll never have it again. It's a little different from his previous angina that was more of a shortness of breath and sensation of not being able to get a deep breath.EKG without acute change and troponins negative.  Past Medical History:  Diagnosis Date  . Abdominal distention   . Anemia    Postoperative  . Chronic diastolic CHF (congestive heart failure) (HCC)   . Coronary artery disease    a. premature - acute MI age 56 s/p BMS to prox LAD. b. s/p DES to RCA, PLA, mLAD in 2007. c. 4V CABG in 2008 with LIMA to LAD, SVG to first diagonal, SVG to first OM, SVG to PDA  . Diabetes (HCC)   . Edema   . H/O gastroesophageal reflux (GERD)   . High cholesterol   . Hypertension   . Myocardial infarction   . Obesity   . Persistent atrial fibrillation (HCC)     a. on Tikosyn, Xarelto.  . Pleural effusion    resolved with therapy    Past Surgical History:  Procedure Laterality Date  . CARDIAC CATHETERIZATION  2008  . CORONARY ANGIOPLASTY    . CORONARY ARTERY BYPASS GRAFT     X4  . exercise cardiolite myocardial perfusion study        Home Meds: Prior to Admission medications   Medication Sig Start Date End Date Taking? Authorizing Provider  ACCU-CHEK AVIVA PLUS test strip  02/08/14  Yes Historical Provider, MD  atorvastatin (LIPITOR) 40 MG tablet Take 40 mg by mouth daily at 6 PM. Generic only 03/26/14  Yes Historical Provider, MD  BYSTOLIC 10 MG tablet Take 5 mg by mouth every morning.  02/08/14  Yes Historical Provider, MD  ferrous sulfate 325 (65 FE) MG tablet Take 325 mg by mouth daily.   Yes Historical Provider, MD  furosemide (LASIX) 40 MG tablet Take 1-2 tablets by mouth daily as needed for fluid 03/26/14  Yes Historical Provider, MD  glimepiride (AMARYL) 2 MG tablet Take 1 tablet by mouth daily. 01/08/16  Yes Historical Provider, MD  HYDROcodone-acetaminophen (NORCO) 10-325 MG tablet Take 1 tablet by mouth as directed. 09/04/15  Yes Historical Provider, MD  JANUVIA 100 MG tablet Take 100 mg by mouth daily.  02/05/14  Yes Historical Provider, MD  Lancets (ACCU-CHEK MULTICLIX) lancets  02/08/14  Yes Historical Provider, MD  losartan (COZAAR) 50 MG tablet Take 25 mg by mouth daily.  02/08/14  Yes Historical Provider, MD  metFORMIN (GLUCOPHAGE) 1000 MG tablet Take 1,000 mg by mouth 2 (two) times daily with a meal.  01/17/14  Yes Historical Provider, MD  Multiple Vitamins-Minerals (MULTIVITAMIN WITH MINERALS) tablet Take 1 tablet by mouth 2 (two) times daily.    Yes Historical Provider, MD  Omeprazole-Sodium Bicarbonate (ZEGERID OTC PO) Take 1 capsule by mouth daily.   Yes Historical Provider, MD  potassium chloride SA (K-DUR,KLOR-CON) 20 MEQ tablet Take 10 mEq by mouth daily.    Yes Historical Provider, MD  TIKOSYN 500 MCG capsule TAKE 1 CAPSULE  (500 MCG TOTAL) BY MOUTH 2 (TWO) TIMES DAILY. CONSULT MD BEFORE TAKING ANY NEW MEDICATIONS. REPORT SIDE EFFECTS TO MD. 09/02/16  Yes Mihai Croitoru, MD  XARELTO 20 MG TABS tablet Take 1 tablet by mouth daily. 04/22/14  Yes Historical Provider, MD    Current Medications: . aspirin EC  81 mg Oral Daily  . atorvastatin  80 mg Oral q1800  . dofetilide  500 mcg Oral BID  . insulin aspart  0-15 Units Subcutaneous TID WC  . insulin aspart  0-5 Units Subcutaneous QHS  . losartan  25 mg Oral Daily  . nebivolol  5 mg Oral Daily  . pantoprazole sodium  40 mg Oral Daily     Allergies:   No Known Allergies  Social History:   The patient  reports that he has quit smoking. His smoking use included Cigarettes. He has a 60.00 pack-year smoking history. He has never used smokeless tobacco. He reports that he drinks about 0.6 oz of alcohol per week . He reports that he does not use drugs.    Family History:   The patient's family history includes CAD in his father; Vascular Disease in his father.   ROS:  Please see the history of present illness.  Review of Systems  Constitution: Positive for weakness and malaise/fatigue.  HENT: Negative.   Cardiovascular: Positive for chest pain and irregular heartbeat.  Respiratory: Negative.   Endocrine: Negative.   Hematologic/Lymphatic: Negative.   Musculoskeletal: Negative.   Gastrointestinal: Negative.   Genitourinary: Negative.    All other ROS reviewed and negative.      Vital Signs: Blood pressure 112/75, pulse 71, temperature 98 F (36.7 C), temperature source Oral, resp. rate 20, height 6' (1.829 m), weight 247 lb 11.2 oz (112.4 kg), SpO2 98 %.   PHYSICAL EXAM: General:  Well nourished, well developed, in no acute distress  HEENT: normal Lymph: no adenopathy Neck: no JVD Endocrine:  No thryomegaly Vascular: No carotid bruits; FA pulses 2+ bilaterally without bruits  Cardiac:  RRR; normal S1, S2; 1/6 systolic murmur LSB,no rub, bruit, thrill, or  heave Lungs:  Decreased breath sounds but clear to auscultation bilaterally, no wheezing, rhonchi or rales  Abd: soft, nontender, no hepatomegaly  Ext: no edema, Good distal pulses bilaterally Musculoskeletal:  No deformities, BUE and BLE strength normal and equal Skin: warm and dry  Neuro:  CNs 2-12 intact, no focal abnormalities noted Psych:  Normal affect    EKG:  NSR without acute findings  Telemetry: NSR  Labs:  Recent Labs  09/24/16 1344 09/24/16 2040 09/25/16 0145 09/25/16 0722  TROPONINI <0.03 <0.03 <0.03 <0.03   Lab Results  Component Value Date   WBC 9.6 09/24/2016   HGB 13.6 09/24/2016   HCT 41.3 09/24/2016   MCV 87.3 09/24/2016   PLT  232 09/24/2016    Recent Labs Lab 09/24/16 1344  NA 137  K 3.9  CL 102  CO2 26  BUN 22*  CREATININE 1.16  CALCIUM 9.9  PROT 8.1  BILITOT 0.7  ALKPHOS 34*  ALT 33  AST 25  GLUCOSE 200*   Lab Results  Component Value Date   CHOL 91 09/24/2016   HDL 23 (L) 09/24/2016   LDLCALC 49 09/24/2016   TRIG 97 09/24/2016   No results found for: DDIMER  Radiology/Studies:  Dg Chest 2 View  Result Date: 09/24/2016 CLINICAL DATA:  Gradual onset of chest pain EXAM: CHEST  2 VIEW COMPARISON:  08/20/2007 FINDINGS: Mild cardiomegaly. Status post CABG. Sheet like calcification along the inferior left ventricle. There is no edema, consolidation, effusion, or pneumothorax. IMPRESSION: 1. No acute finding. 2. Inferior left ventricular calcification which could reflect a LV aneurysm. Recommend follow-up. 3. CABG and chronic cardiomegaly. Electronically Signed   By: Marnee Spring M.D.   On: 09/24/2016 14:15     PROBLEM LIST:  Principal Problem:   Chest pain Active Problems:   Persistent atrial fibrillation (HCC)   CAD (coronary artery disease)   HTN (hypertension)   Mixed hyperlipidemia   DM type 2 causing vascular disease (HCC)   Long term current use of anticoagulant therapy     ASSESSMENT AND PLAN:  Chest pain  worrisome for ischemia relieved with NTG and on IV heparin. Recommend cardiac cath. Patient states he never wants CABG again because of complications in past. Last Xarelto dose yesterday 9:30 am.  CAD S/P multiple stents and CABG x 4 2008-see above for details  Atrial fibrillation on Tikosyn and Xarelto maintaining NSR  DM type 2  HTN controlled on coazzr and bystolic  HLD controlled on lipitor     Signed, Jacolyn Reedy, [name]  09/25/2016 8:45 AM   Attending note Patient seen and discussed with PA Geni Bers, I agree with her documentation above. 56 yo male with medical history as outlined above, including CAD with previous stents and CABG presents with chest pain. EKG and enzymes are not consistent with ACS at this time. Current medical therapy with ASA, atorva 80, hep gtt, losartan 25, bystoilc 5.   Patient with very strong CAD history, has not had any form of ischemic evaluation since his CABG in 2008. Presents with sudden chest pressure 8/10 in midchest with SOB and diaphoresis, radiating to back under left shoulder blade and left jaw, better with NG. Overall fairly good story for angina. Given his strong history and strong story we will plan for a definitive cath to evaluate his coronary anatomy. We will arrange transfer to [facility] today.   Dina Rich MD

## 2016-09-25 NOTE — Progress Notes (Signed)
PROGRESS NOTE    [name] KocherOtha B Vanpatten  ZOX:[id number] DOB: 1960-08-17 DOA: 09/24/2016 PCP: Milana ObeyStephen D Knowlton, MD    Brief Narrative:  [name] is a 56 y.o. male with medical history significant of premature CAD s/p many stents and remote CABG; PAF on Tikosyn, Xarelto; HTN; HLD; DM presenting with chest pain.  This AM at work, he noticed chest tightness.  Pain was getting worse.  He has h/o heart problems and so it scared him some.  Thought it might be indigestion but it began radiating into back and jaw and this made him concerned.  He was also SOB, diaphoretic, and nauseated.  By the time he got to the ER, it had eased off.  After second NTG, it was completely gone.  Substernal, he was actively cooking when the pain started.  This was not at all similar to prior index symptoms.  Has had stents and CABG and then he could tell ahead of time that he was having a heart problem.  All of those times, the chest pain felt like when he was smoking and when you step outside and take a deep breath and feel that cold deep in the lungs.  Today's pain, on the other hand, was "severe, straight on pain".  Initial heart problem required stents at 56 years old - 3 stents.  A few years later, he had more stents, 2-3 more.  About 7-8 years ago, he had CABG of 5 vessels (plan had been to do 7).  Father's side of the family with marked CAD.  Cholesterol medication is working well, total cholesterol about 100 a month ago.  Also with DM, recently much better control than prior.  BP is usually decent on 3 BP medications.  Quit smoking years ago (after 2nd set of stents).  Last stress test was with 2nd set of stents.  Last cath was with CABG.   Assessment & Plan:   Principal Problem:   Chest pain Active Problems:   Persistent atrial fibrillation (HCC)   CAD (coronary artery disease)   HTN (hypertension)   Mixed hyperlipidemia   DM type 2 causing vascular disease (HCC)   Long term current use of anticoagulant  therapy  Chest pain -Patient with substernal chest pressure that came on with exertion and was relieved with rest and NTG. -3/3 typical symptoms suggestive of anginal chest pain.  -Marked h/o premature CAD without recent stress testing or cath. -Currently chest pain free. -CXR shows possible LV aneurysm. - Troponins negative x 3 -EKG is somewhat concerning for ischemia, but negative for STEMI.  -GRACE score is 119; which predicts an in-hospital death rate of 1.6%.  -Continue ASA 81 mg  daily -morphine given -Cardiology consulted and patient [facility] - Heparin drip  HTN -Takes Bystolic and Losartan at home -Patient with suboptimal control while in the ER. -May need prn hydralazine  HLD -Continue Lipitor but increase to 80 mg qhs for now  DM -Glucose 200 -Taking Amaryl, Januvia, and Metformin -A1c pending - SSI for now  Afib on anticoagulation -He is taking Tikosyn as well as Xarelto -Continue Tikosyn for now -Hold Xarelto while on heparin drip -Adequately rate controlled -CHA2DS2-VASc score is 3, stroke rate of 3.2%/year   DVT prophylaxis: Xarelto transitioning to Heparin drip Code Status:  Full - confirmed with patient Family Communication: None present Disposition Plan:  Home once clinically improved   Consultants:   Cardiology  Procedures:   Planning for cardiac cath  Antimicrobials:   None    Subjective: Patient seen.  States he is being transferred to [facility] by cardiology for cardiac catheterization.  Denies chest pain.  Objective: Vitals:   09/24/16 1925 09/24/16 1930 09/24/16 2015 09/25/16 0620  BP: 123/89 (!) 124/98 138/81 112/75  Pulse: 80  76 71  Resp: 20  18 20   Temp:   97.9 F (36.6 C) 98 F (36.7 C)  TempSrc:   Oral Oral  SpO2: 98%  99% 98%  Weight:    112.4 kg (247 lb 11.2 oz)  Height:        Intake/Output Summary (Last 24 hours) at 09/25/16 0954 Last data filed at 09/25/16 0834  Gross per 24  hour  Intake              240 ml  Output              400 ml  Net             -160 ml   Filed Weights   09/24/16 1334 09/25/16 0620  Weight: 109.8 kg (242 lb) 112.4 kg (247 lb 11.2 oz)    Examination:  General exam: Appears calm and comfortable  Respiratory system: Clear to auscultation. Respiratory effort normal. Cardiovascular system: S1 & S2 heard, RRR. No JVD, murmurs, rubs, gallops or clicks. No pedal edema. Gastrointestinal system: Abdomen is nondistended, soft and nontender. No organomegaly or masses felt. Normal bowel sounds heard. Central nervous system: Alert and oriented. No focal neurological deficits. Extremities: Symmetric 5 x 5 power. Skin: No rashes, lesions or ulcers Psychiatry: Judgement and insight appear normal. Mood & affect appropriate.     Data Reviewed: I have personally reviewed following labs and imaging studies  CBC:  Recent Labs Lab 09/24/16 1344  WBC 9.6  NEUTROABS 6.5  HGB 13.6  HCT 41.3  MCV 87.3  PLT 232   Basic Metabolic Panel:  Recent Labs Lab 09/24/16 1344  NA 137  K 3.9  CL 102  CO2 26  GLUCOSE 200*  BUN 22*  CREATININE 1.16  CALCIUM 9.9   GFR: Estimated Creatinine Clearance: 94.2 mL/min (by C-G formula based on SCr of 1.16 mg/dL). Liver Function Tests:  Recent Labs Lab 09/24/16 1344  AST 25  ALT 33  ALKPHOS 34*  BILITOT 0.7  PROT 8.1  ALBUMIN 4.5   No results for input(s): LIPASE, AMYLASE in the last 168 hours. No results for input(s): AMMONIA in the last 168 hours. Coagulation Profile: No results for input(s): INR, PROTIME in the last 168 hours. Cardiac Enzymes:  Recent Labs Lab 09/24/16 1344 09/24/16 2040 09/25/16 0145 09/25/16 0722  TROPONINI <0.03 <0.03 <0.03 <0.03   BNP (last 3 results) No results for input(s): PROBNP in the last 8760 hours. HbA1C: No results for input(s): HGBA1C in the last 72 hours. CBG:  Recent Labs Lab 09/24/16 2103 09/25/16 0803  GLUCAP 182* 186*   Lipid  Profile:  Recent Labs  09/24/16 1345  CHOL 91  HDL 23*  LDLCALC 49  TRIG 97  CHOLHDL 4.0   Thyroid Function Tests:  Recent Labs  09/24/16 1345  TSH 0.519   Anemia Panel: No results for input(s): VITAMINB12, FOLATE, FERRITIN, TIBC, IRON, RETICCTPCT in the last 72 hours. Sepsis Labs: No results for input(s): PROCALCITON, LATICACIDVEN in the last 168 hours.  No results found for this or any previous visit (from the past 240 hour(s)).       Radiology Studies: Dg Chest 2 View  Result Date: 09/24/2016  CLINICAL DATA:  Gradual onset of chest pain EXAM: CHEST  2 VIEW COMPARISON:  08/20/2007 FINDINGS: Mild cardiomegaly. Status post CABG. Sheet like calcification along the inferior left ventricle. There is no edema, consolidation, effusion, or pneumothorax. IMPRESSION: 1. No acute finding. 2. Inferior left ventricular calcification which could reflect a LV aneurysm. Recommend follow-up. 3. CABG and chronic cardiomegaly. Electronically Signed   By: Marnee Spring M.D.   On: 09/24/2016 14:15        Scheduled Meds: . aspirin EC  81 mg Oral Daily  . atorvastatin  80 mg Oral q1800  . dofetilide  500 mcg Oral BID  . insulin aspart  0-15 Units Subcutaneous TID WC  . insulin aspart  0-5 Units Subcutaneous QHS  . losartan  25 mg Oral Daily  . nebivolol  5 mg Oral Daily  . pantoprazole sodium  40 mg Oral Daily   Continuous Infusions: . heparin 1,600 Units/hr (09/25/16 0807)     LOS: 0 days    Time spent: 30 minutes    Katrinka Blazing, MD [facility] Pager [phone]  If 7PM-7AM, please contact night-coverage [email or web address] Password [facility] 09/25/2016, 9:54 AM

## 2016-09-26 ENCOUNTER — Encounter (HOSPITAL_COMMUNITY): Payer: Self-pay | Admitting: Interventional Cardiology

## 2016-09-26 DIAGNOSIS — E782 Mixed hyperlipidemia: Secondary | ICD-10-CM

## 2016-09-26 DIAGNOSIS — I10 Essential (primary) hypertension: Secondary | ICD-10-CM | POA: Diagnosis not present

## 2016-09-26 DIAGNOSIS — E1159 Type 2 diabetes mellitus with other circulatory complications: Secondary | ICD-10-CM | POA: Diagnosis not present

## 2016-09-26 DIAGNOSIS — I25110 Atherosclerotic heart disease of native coronary artery with unstable angina pectoris: Secondary | ICD-10-CM | POA: Diagnosis not present

## 2016-09-26 DIAGNOSIS — I209 Angina pectoris, unspecified: Secondary | ICD-10-CM | POA: Diagnosis present

## 2016-09-26 DIAGNOSIS — Z7901 Long term (current) use of anticoagulants: Secondary | ICD-10-CM

## 2016-09-26 LAB — GLUCOSE, CAPILLARY
Glucose-Capillary: 294 mg/dL — ABNORMAL HIGH (ref 65–99)
Glucose-Capillary: 285 mg/dL — ABNORMAL HIGH (ref 65–99)
Glucose-Capillary: 225 mg/dL — ABNORMAL HIGH (ref 65–99)

## 2016-09-26 LAB — HEMOGLOBIN A1C
HEMOGLOBIN A1C: 8.6 % — AB (ref 4.8–5.6)
MEAN PLASMA GLUCOSE: 200 mg/dL

## 2016-09-26 LAB — HIV ANTIBODY (ROUTINE TESTING W REFLEX): HIV SCREEN 4TH GENERATION: NONREACTIVE

## 2016-09-26 MED ORDER — ACETAMINOPHEN 325 MG PO TABS: 650.0000 mg | ORAL_TABLET | ORAL | Status: DC | PRN

## 2016-09-26 MED ORDER — NITROGLYCERIN 0.4 MG SL SUBL: 0.4000 mg | SUBLINGUAL_TABLET | SUBLINGUAL | 4 refills | Status: AC | PRN

## 2016-09-26 MED ORDER — ISOSORBIDE MONONITRATE ER 30 MG PO TB24: 30.0000 mg | ORAL_TABLET | Freq: Every day | ORAL | 0 refills | Status: DC

## 2016-09-26 MED ORDER — LOSARTAN POTASSIUM 25 MG PO TABS: 25.0000 mg | ORAL_TABLET | Freq: Every day | ORAL | 4 refills | Status: DC

## 2016-09-26 MED ORDER — METFORMIN HCL 1000 MG PO TABS: 1000.0000 mg | ORAL_TABLET | Freq: Two times a day (BID) | ORAL | Status: DC

## 2016-09-26 MED ORDER — ISOSORBIDE MONONITRATE ER 30 MG PO TB24
30.0000 mg | ORAL_TABLET | Freq: Every day | ORAL | Status: DC
  Administered 2016-09-26: 30 mg via ORAL
  Filled 2016-09-26: qty 1

## 2016-09-26 MED ORDER — ISOSORBIDE MONONITRATE ER 30 MG PO TB24: 30.0000 mg | ORAL_TABLET | Freq: Every day | ORAL | 6 refills | Status: DC

## 2016-09-26 MED ORDER — LOSARTAN POTASSIUM 25 MG PO TABS: 25.0000 mg | ORAL_TABLET | Freq: Every day | ORAL | 11 refills | Status: DC

## 2016-09-26 NOTE — Progress Notes (Signed)
Inpatient Diabetes Program Recommendations  AACE/ADA: New Consensus Statement on Inpatient Glycemic Control (2015)  Target Ranges:  Prepandial:   less than 140 mg/dL      Peak postprandial:   less than 180 mg/dL (1-2 hours)      Critically ill patients:  140 - 180 mg/dL   Lab Results  Component Value Date   GLUCAP 285 (H) 09/26/2016   HGBA1C 8.6 (H) 09/24/2016    Review of Glycemic Control Results for [name], [name] (MRN [id number]) as of 09/26/2016 09:41  Ref. Range 09/25/2016 08:03 09/25/2016 11:16 09/25/2016 17:37 09/25/2016 22:28 09/26/2016 08:30  Glucose-Capillary Latest Ref Range: 65 - 99 mg/dL 409186 (H) 811160 (H) 914142 (H) 294 (H) 285 (H)   Diabetes history: DM2 Outpatient Diabetes medications: Januvia 100 QD, Metformin 1000 BID, Amaryl 2 mg QD Current orders for Inpatient glycemic control: Novolog correction 0-15 units tid + 0-5 units hs  Inpatient Diabetes Program Recommendations:  While oral meds held, please consider Lantus 22 units daily (0.2 units/kg x 113 kg).  Thank you, [name] FischerJudy E. Shiloh Swopes, RN, MSN, CDE Inpatient Glycemic Control Team Team Pager [phone] (8am-5pm) 09/26/2016 9:44 AM

## 2016-09-26 NOTE — Discharge Instructions (Signed)
Call [facility] at [phone] if any bleeding, swelling or drainage at cath site.  May shower, no tub baths for 48 hours for groin sticks. No lifting over 5 pounds for 3 days.  No Driving for 3 days.    No metformin until the 31st of March may interact with cath dye.  Take 1 NTG, under your tongue, while sitting.  If no relief of pain may repeat NTG, one tab every 5 minutes up to 3 tablets total over 15 minutes.  If no relief CALL 911.  If you have dizziness/lightheadness  while taking NTG, stop taking and call 911.        We decreased your cozaar to 25 mg - take half of your 50 mg for now until new script comes from mail order.  Questions please call the office.   Heart Healthy diabetic diet.

## 2016-09-26 NOTE — Discharge Summary (Signed)
Discharge Summary    Patient ID: [name],  MRN: [id number], DOB/AGE: [birth year] 56 y.o.  Admit date: 09/24/2016 Discharge date: 09/26/2016  Primary Care Provider: Milana ObeyStephen D Knowlton Primary Cardiologist: Dr. Judie PetitM. Croitoru   Discharge Diagnoses    Principal Problem:   Angina pectoris [facility](HCC) Active Problems:   Persistent atrial fibrillation (HCC)   CAD (coronary artery disease)   HTN (hypertension)   Mixed hyperlipidemia   DM type 2 causing vascular disease (HCC)   Long term current use of anticoagulant therapy   Chest pain   Allergies No Known Allergies  Diagnostic Studies/Procedures    Cardiac Cath   Procedures   Left Heart Cath and Cors/Grafts Angiography  Conclusion     Ost LM to LM lesion, 60 %stenosed.  Mid LAD lesion, 60 %stenosed. Patent LIMA to LAD.  Patent SVG to diagonal.  Patent SVG to moderately disease OM. The OM is patent back to the circumflex and provides some flow to the circumflex.  RPDA lesion, 80 %stenosed. Patent SVG to PDA. Early bifurcation of PDA, PLA.  The left ventricular systolic function is normal.  LV end diastolic pressure is mildly elevated.  There is no aortic valve stenosis.    Left main lesion could be intervened upon to improve flow to the circumflex, but this may disrupt flow in the other grafts.  Therefore, this was deferred in favor of medical therapy.  No clear culprit lesion for acute presentation. Negative enzymes at this point as well.    Continue aggressive medical therapy.     Diagnostic Diagram         _____________   History of Present Illness     56 y.o. male with a hx of CAD and atrial fibrillation on Tikosyn and Xarelto, CHADSVASC=3, DM type 2, HTN, HLD, GERD. He had MI treated with BMS LAD age 56, DES RCA and PLA 2007 and DES x 2 mid LAD. CABG x 4 2008 LIMA-LAD, SVG-Dx, SVG-OM1, SVG-PDA. Last 2Decho 2015 normal LVEF 50-55% mild MR.  Patient developed chest pain while cooking at [employer]--  he developed severe chest pressure into his back associated with diaphoresis, shortness of breath and left jaw pain. He initially thought it was indigestion but it got so severe he came to ER where it already started to ease but left completely with NTG. Hasn't had any angina since CABG but doesn't think he ever got back to his "normal" since CABG and says he'll never have it again. It's a little different from his previous angina that was more of a shortness of breath and sensation of not being able to get a deep breath.EKG without acute change and troponins negative.  [facility] and [facility].     Hospital Course     Consultants: none   Pt's enzymes are negative.  His cardiac cath revealed disease but to treat medically.  Imdur 30 mg added.  We decreased the cozaar.  Otherwise continue current meds.    He was seen and evaluated by Dr. Royann Shiversroitoru and found stable for discharge.  He walked without problems.  He will follow up in the office.   _____________  Discharge Vitals Blood pressure 115/70, pulse 65, temperature 97.8 F (36.6 C), temperature source Oral, resp. rate 18, height 6' (1.829 m), weight 249 lb 1.6 oz (113 kg), SpO2 97 %.  Filed Weights   09/24/16 1334 09/25/16 0620 09/25/16 1348  Weight: 242 lb (109.8 kg)  247 lb 11.2 oz (112.4 kg) 249 lb 1.6 oz (113 kg)    Labs & Radiologic Studies    CBC  Recent Labs  09/24/16 1344 09/25/16 1833  WBC 9.6 12.7*  NEUTROABS 6.5  --   HGB 13.6 12.3*  HCT 41.3 38.0*  MCV 87.3 87.4  PLT 232 187   Basic Metabolic Panel  Recent Labs  09/24/16 1344  NA 137  K 3.9  CL 102  CO2 26  GLUCOSE 200*  BUN 22*  CREATININE 1.16  CALCIUM 9.9   Liver Function Tests  Recent Labs  09/24/16 1344  AST 25  ALT 33  ALKPHOS 34*  BILITOT 0.7  PROT 8.1  ALBUMIN 4.5   No results for input(s): LIPASE, AMYLASE in the last 72 hours. Cardiac Enzymes  Recent Labs  09/24/16 2040 09/25/16 0145  09/25/16 0722  TROPONINI <0.03 <0.03 <0.03   BNP Invalid input(s): POCBNP D-Dimer No results for input(s): DDIMER in the last 72 hours. Hemoglobin A1C  Recent Labs  09/24/16 1345  HGBA1C 8.6*   Fasting Lipid Panel  Recent Labs  09/24/16 1345  CHOL 91  HDL 23*  LDLCALC 49  TRIG 97  CHOLHDL 4.0   Thyroid Function Tests  Recent Labs  09/24/16 1345  TSH 0.519   _____________  Dg Chest 2 View  Result Date: 09/24/2016 CLINICAL DATA:  Gradual onset of chest pain EXAM: CHEST  2 VIEW COMPARISON:  08/20/2007 FINDINGS: Mild cardiomegaly. Status post CABG. Sheet like calcification along the inferior left ventricle. There is no edema, consolidation, effusion, or pneumothorax. IMPRESSION: 1. No acute finding. 2. Inferior left ventricular calcification which could reflect a LV aneurysm. Recommend follow-up. 3. CABG and chronic cardiomegaly. Electronically Signed   By: Marnee Spring M.D.   On: 09/24/2016 14:15   Disposition   Pt is being discharged home today in good condition.  Follow-up Plans & Appointments    Follow-up Information    Thurmon Fair, MD Follow up on 10/14/2016.   Specialty:  Cardiology Why:  at 10:00 am with his PA [name].  Contact information: [address] [place] [address] [phone]           Call [facility] at [phone] if any bleeding, swelling or drainage at cath site.  May shower, no tub baths for 48 hours for groin sticks. No lifting over 5 pounds for 3 days.  No Driving for 3 days.    No metformin until the 31st of March may interact with cath dye.  Take 1 NTG, under your tongue, while sitting.  If no relief of pain may repeat NTG, one tab every 5 minutes up to 3 tablets total over 15 minutes.  If no relief CALL 911.  If you have dizziness/lightheadness  while taking NTG, stop taking and call 911.        We decreased your cozaar to 25 mg - take half of your 50 mg for now until new  script comes from mail order.  Questions please call the office.   Heart Healthy diabetic diet.  Discharge Medications   Discharge Medication List as of 09/26/2016 12:24 PM    START taking these medications   Details  acetaminophen (TYLENOL) 325 MG tablet Take 2 tablets (650 mg total) by mouth every 4 (four) hours as needed for headache or mild pain., Starting Thu 09/26/2016, No Print    nitroGLYCERIN (NITROSTAT) 0.4 MG SL tablet Place 1 tablet (0.4 mg total) under the tongue  every 5 (five) minutes as needed for chest pain., Starting Thu 09/26/2016, Normal      CONTINUE these medications which have CHANGED   Details  isosorbide mononitrate (IMDUR) 30 MG 24 hr tablet Take 1 tablet (30 mg total) by mouth daily., Starting Thu 09/26/2016, Normal    losartan (COZAAR) 25 MG tablet Take 1 tablet (25 mg total) by mouth daily., Starting Fri 09/27/2016, Normal    metFORMIN (GLUCOPHAGE) 1000 MG tablet Take 1 tablet (1,000 mg total) by mouth 2 (two) times daily with a meal., Starting Sat 09/28/2016, No Print      CONTINUE these medications which have NOT CHANGED   Details  ACCU-CHEK AVIVA PLUS test strip Historical Med    atorvastatin (LIPITOR) 40 MG tablet Take 40 mg by mouth daily at 6 PM. Generic only, Starting Sat 03/26/2014, Historical Med    BYSTOLIC 10 MG tablet Take 5 mg by mouth every morning. , Starting Tue 02/08/2014, Historical Med    ferrous sulfate 325 (65 FE) MG tablet Take 325 mg by mouth daily., Historical Med    furosemide (LASIX) 40 MG tablet Take 1-2 tablets by mouth daily as needed for fluid, Starting Sat 03/26/2014, Historical Med    glimepiride (AMARYL) 2 MG tablet Take 1 tablet by mouth daily., Starting Mon 01/08/2016, Historical Med    HYDROcodone-acetaminophen (NORCO) 10-325 MG tablet Take 1 tablet by mouth as directed., Starting Mon 09/04/2015, Historical Med    JANUVIA 100 MG tablet Take 100 mg by mouth daily. , Starting Sat 02/05/2014, Historical Med    Lancets (ACCU-CHEK  MULTICLIX) lancets Historical Med    Multiple Vitamins-Minerals (MULTIVITAMIN WITH MINERALS) tablet Take 1 tablet by mouth 2 (two) times daily. , Historical Med    Omeprazole-Sodium Bicarbonate (ZEGERID OTC PO) Take 1 capsule by mouth daily., Historical Med    potassium chloride SA (K-DUR,KLOR-CON) 20 MEQ tablet Take 10 mEq by mouth daily. , Historical Med    TIKOSYN 500 MCG capsule TAKE 1 CAPSULE (500 MCG TOTAL) BY MOUTH 2 (TWO) TIMES DAILY. CONSULT MD BEFORE TAKING ANY NEW MEDICATIONS. REPORT SIDE EFFECTS TO MD., Normal    XARELTO 20 MG TABS tablet Take 1 tablet by mouth daily., Starting Fri 04/22/2014, Historical Med           Outstanding Labs/Studies     Duration of Discharge Encounter   Greater than 30 minutes including physician time.  Signed, Nada Boozer NP 09/26/2016, 2:36 PM

## 2016-09-26 NOTE — Progress Notes (Signed)
Progress Note  Patient Name: [name] Date of Encounter: 09/26/2016  Primary Cardiologist:  Dr. Judie PetitM. Nari Vannatter   Subjective   No further pain, no SOB has not walked in hall but no problems ambulating in room.    Inpatient Medications    Scheduled Meds: . aspirin EC  81 mg Oral Daily  . atorvastatin  80 mg Oral q1800  . dofetilide  500 mcg Oral BID  . insulin aspart  0-15 Units Subcutaneous TID WC  . insulin aspart  0-5 Units Subcutaneous QHS  . losartan  25 mg Oral Daily  . nebivolol  5 mg Oral Daily  . pantoprazole sodium  40 mg Oral Daily  . rivaroxaban  20 mg Oral Daily  . sodium chloride flush  3 mL Intravenous Q12H   Continuous Infusions:  PRN Meds: sodium chloride, acetaminophen, gi cocktail, morphine injection, nitroGLYCERIN, ondansetron (ZOFRAN) IV, sodium chloride flush   Vital Signs    Vitals:   09/25/16 1755 09/25/16 1800 09/25/16 2134 09/26/16 0523  BP: 116/68 (!) 91/45 126/70 115/70  Pulse: 60 60 65 65  Resp: 19 19 18 18   Temp:   98.1 F (36.7 C) 97.8 F (36.6 C)  TempSrc:   Oral Oral  SpO2: 96% 96% 98% 97%  Weight:      Height:        Intake/Output Summary (Last 24 hours) at 09/26/16 0833 Last data filed at 09/25/16 2200  Gross per 24 hour  Intake              480 ml  Output              300 ml  Net              180 ml   Filed Weights   09/24/16 1334 09/25/16 0620 09/25/16 1348  Weight: 242 lb (109.8 kg) 247 lb 11.2 oz (112.4 kg) 249 lb 1.6 oz (113 kg)    Telemetry    SR  - Personally Reviewed  ECG    Yesterday SR and no acute changes.   - Personally Reviewed  Physical Exam   GEN: No acute distress.   Neck: No JVD Cardiac: RRR, no murmurs, rubs, or gallops. Rt groin cath site without hematoma  Respiratory: Clear to auscultation bilaterally. GI: Soft, nontender, non-distended  MS: No edema; No deformity. Neuro:  Nonfocal  Psych: Normal affect   Labs    Chemistry Recent Labs Lab 09/24/16 1344  NA 137  K 3.9  CL 102   CO2 26  GLUCOSE 200*  BUN 22*  CREATININE 1.16  CALCIUM 9.9  PROT 8.1  ALBUMIN 4.5  AST 25  ALT 33  ALKPHOS 34*  BILITOT 0.7  GFRNONAA >60  GFRAA >60  ANIONGAP 9     Hematology Recent Labs Lab 09/24/16 1344 09/25/16 1833  WBC 9.6 12.7*  RBC 4.73 4.35  HGB 13.6 12.3*  HCT 41.3 38.0*  MCV 87.3 87.4  MCH 28.8 28.3  MCHC 32.9 32.4  RDW 13.2 13.6  PLT 232 187    Cardiac Enzymes Recent Labs Lab 09/24/16 1344 09/24/16 2040 09/25/16 0145 09/25/16 0722  TROPONINI <0.03 <0.03 <0.03 <0.03   No results for input(s): TROPIPOC in the last 168 hours.   BNPNo results for input(s): BNP, PROBNP in the last 168 hours.   DDimer No results for input(s): DDIMER in the last 168 hours.   Radiology    Dg Chest 2 View  Result Date: 09/24/2016 CLINICAL DATA:  Gradual onset of chest pain EXAM: CHEST  2 VIEW COMPARISON:  08/20/2007 FINDINGS: Mild cardiomegaly. Status post CABG. Sheet like calcification along the inferior left ventricle. There is no edema, consolidation, effusion, or pneumothorax. IMPRESSION: 1. No acute finding. 2. Inferior left ventricular calcification which could reflect a LV aneurysm. Recommend follow-up. 3. CABG and chronic cardiomegaly. Electronically Signed   By: Marnee Spring M.D.   On: 09/24/2016 14:15    Cardiac Studies   Cardiac cath 09/25/16  By Dr. Eldridge Dace Procedures   Left Heart Cath and Cors/Grafts Angiography  Conclusion     Ost LM to LM lesion, 60 %stenosed.  Mid LAD lesion, 60 %stenosed. Patent LIMA to LAD.  Patent SVG to diagonal.  Patent SVG to moderately disease OM. The OM is patent back to the circumflex and provides some flow to the circumflex.  RPDA lesion, 80 %stenosed. Patent SVG to PDA. Early bifurcation of PDA, PLA.  The left ventricular systolic function is normal.  LV end diastolic pressure is mildly elevated.  There is no aortic valve stenosis.    Left main lesion could be intervened upon to improve flow to the  circumflex, but this may disrupt flow in the other grafts.  Therefore, this was deferred in favor of medical therapy.  No clear culprit lesion for acute presentation. Negative enzymes at this point as well.    Continue aggressive medical therapy.       Patient Profile     56 y.o. male with a hx of CAD and atrial fibrillation on Tikosyn and Xarelto, CHADSVASC=3, DM type 2, HTN, HLD, GERD. He had MI treated with BMS LAD age 56, DES RCA and PLA 2007 and DES x 2 mid LAD. CABG x 4 2008 LIMA-LAD, SVG-Dx, SVG-OM1, SVG-PDA. Last 2Decho 2015 normal LVEF 50-55% mild MR.  Transferred from [facility] for chest pressure- angina for cardiac cath.   Assessment & Plan    Chest pain worrisome for ischemia relieved with NTG and was IV heparin.  troponins neg.  Cardiac cath as above with recommendation for medical therapy.  Patient states he never wants CABG again because of complications in past. Xarelto has been resumed. -we could add imdur BP is soft at times.   --ambulate if stable discharge.    CAD S/P multiple stents and CABG x 4 2008-see above for details  Atrial fibrillation on Tikosyn and Xarelto maintaining NSR.  CHA2DS2Vasc score of 4  DM type 2  HTN controlled on coazzr and bystolic  HLD controlled on lipitor    Signed, Nada Boozer, NP  09/26/2016, 8:33 AM    I have seen and examined the patient along with Nada Boozer, NP.  I have reviewed the chart, notes and new data.  I agree with NP's note.  Key new complaints: no angina, no problems at groin site Key examination changes: no groin hematoma Key new findings / data: reviewed cath lab diagram with the patient.  PLAN: Medical therapy - add long acting nitrates. Consider Ranexa (BP is rather low). Will decrease the dose of losartan to try to keep on max dose of beta blocker he can tolerate. Excellent LDL (<50). Needs improved glycemic control (target A1c<7%), this might help increase his HDL. Needs weight loss and  regular exercise. Early F/U.  Thurmon Fair, MD, [facility] [phone] 09/26/2016, 9:54 AM

## 2016-10-14 ENCOUNTER — Encounter: Payer: Self-pay | Admitting: Physician Assistant

## 2016-10-14 ENCOUNTER — Ambulatory Visit (INDEPENDENT_AMBULATORY_CARE_PROVIDER_SITE_OTHER): Payer: Federal, State, Local not specified - PPO | Admitting: Physician Assistant

## 2016-10-14 VITALS — BP 139/87 | HR 81 | Ht 72.0 in | Wt 256.8 lb

## 2016-10-14 DIAGNOSIS — I25708 Atherosclerosis of coronary artery bypass graft(s), unspecified, with other forms of angina pectoris: Secondary | ICD-10-CM

## 2016-10-14 DIAGNOSIS — I481 Persistent atrial fibrillation: Secondary | ICD-10-CM

## 2016-10-14 DIAGNOSIS — I4819 Other persistent atrial fibrillation: Secondary | ICD-10-CM

## 2016-10-14 LAB — BASIC METABOLIC PANEL
BUN: 20 mg/dL (ref 7–25)
CHLORIDE: 102 mmol/L (ref 98–110)
CO2: 24 mmol/L (ref 20–31)
CREATININE: 0.88 mg/dL (ref 0.70–1.33)
Calcium: 9.5 mg/dL (ref 8.6–10.3)
GLUCOSE: 268 mg/dL — AB (ref 65–99)
POTASSIUM: 4.6 mmol/L (ref 3.5–5.3)
Sodium: 137 mmol/L (ref 135–146)

## 2016-10-14 MED ORDER — POTASSIUM CHLORIDE CRYS ER 20 MEQ PO TBCR: 20.0000 meq | EXTENDED_RELEASE_TABLET | Freq: Every day | ORAL | 3 refills | Status: DC

## 2016-10-14 MED ORDER — POTASSIUM CHLORIDE CRYS ER 20 MEQ PO TBCR: 20.0000 meq | EXTENDED_RELEASE_TABLET | Freq: Every day | ORAL | 1 refills | Status: DC

## 2016-10-14 NOTE — Patient Instructions (Signed)
Medication Instructions:  INCREASE POTASSIUM TO 20 MEQ (WHOLE TABLET) DAILY  OK TO TAKE 1 DOSE (2 TAB-650MG ) TYLENOL DAILY  If you need a refill on your cardiac medications before your next appointment, please call your pharmacy.  Labwork: BMET AND MAG TODAY AT [facility] ON THE 1ST FLOOR  Follow-Up: Your physician wants you to follow-up in: February 2019 WITH DR Royann Shivers You will receive a reminder letter in the mail two months in advance. If you don't receive a letter, please call our office November 2018 to schedule the February 2019 follow-up appointment.   Special Instructions: CHANGE DR ALLRED APPT TO FOLLOW UP IN 3 MONTHS  CHECK BP TWICE WEEKLY AT DIFFERENT TIME DURING THE DAY    Thank you for choosing [facility] at [facility]!!    RHONDA BARRETT, [name] Marcelino Duster, LPN

## 2016-10-14 NOTE — Progress Notes (Signed)
Cardiology Office Note   Date:  10/14/2016   ID:  [name], DOB 18-May-1961, MRN [id number]  PCP:  [name] Obey, MD  Cardiologist:  [name] 08/05/2016 [name] Demark, [name]   Chief Complaint  Patient presents with  . Follow-up  . Leg Pain    cramping in legs @ night.     History of Present Illness: [name] is a 56 y.o. male with a history of atrial fibrillation on Tikosyn and Xarelto, CHADSVASC=3, DM type 2, HTN, HLD, GERD. He had MI treated with BMS LAD age 17, DES RCA and PLA 2007 and DES x 2 mid LAD. CABG x 4 2008 LIMA-LAD, SVG-Dx, SVG-OM1, SVG-PDA. Last 2Decho 2015 normal LVEF 50-55% mild MR.  Admit 03/27-03/29/2018 for chest pain, cath with medical therapy for 60% left main and moderate disease in the OM. There was concern that a stent to the left main would disrupt flow to the circumflex/OM system.  [name] presents for cardiology follow up.  He had chest pain one day and it was relieved with SL NTG x 1. He still has SOB at times. He feels he can do what he needs to do, but does not feel that much better.   BP at home runs 120s-140/70s-80s at home. He is salt sensitive, Notices his blood pressure running higher when he eats salty foods. He's been a little bit more tired than usual and wonders if his medications are causing this.  He's had a headache every day since he started on the isosorbide. He is willing to try to stick with it for a month per Dr. Erin [name] instructions. He has not been taking Tylenol because he was worried about side effects of it. A relative of his head renal failure from large doses of Tylenol and he is concerned about that. He does take the oxycodone every night before bedtime, but that is the only time he takes it every day.  He does not get palpitations since does not feel like he has had any additional episodes of the atrial fibrillation. He is not having any bleeding issues on the Xarelto.  He wonders if he should take  a higher dose of potassium. He recently was wakened by leg cramps. This used to happen to him at night, but only during the summer. In the summer, he works outside and sweats more and feels like this is responsible.   Past Medical History:  Diagnosis Date  . Abdominal distention   . Anemia    Postoperative  . Chronic diastolic CHF (congestive heart failure) (HCC)   . Coronary artery disease    a. premature - acute MI age 69 s/p BMS to prox LAD. b. s/p DES to RCA, PLA, mLAD in 2007. c. 4V CABG in 2008 with LIMA to LAD, SVG to first diagonal, SVG to first OM, SVG to PDA  . Diabetes (HCC)   . Edema   . H/O gastroesophageal reflux (GERD)   . High cholesterol   . Hypertension   . Myocardial infarction (HCC)   . Obesity   . Persistent atrial fibrillation (HCC)    a. on Tikosyn, Xarelto.  . Pleural effusion    resolved with therapy    Past Surgical History:  Procedure Laterality Date  . CARDIAC CATHETERIZATION  2008  . CORONARY ANGIOPLASTY  1996  . CORONARY ARTERY BYPASS GRAFT  2008   LIMA-LAD, SVG-Dx, SVG-OM1, SVG-PDA  . LEFT HEART CATH AND CORS/GRAFTS ANGIOGRAPHY N/A 09/25/2016  Procedure: Left Heart Cath and Cors/Grafts Angiography;  Surgeon: [name] Crafts, MD;  Location: [facility] INVASIVE CV LAB;  Service: Cardiovascular;  Laterality: N/A;    Medication Sig  . ACCU-CHEK AVIVA PLUS test strip   . acetaminophen (TYLENOL) 325 MG tablet Take 2 tablets (650 mg total) by mouth every 4 (four) hours as needed for headache or mild pain.  Marland Kitchen atorvastatin (LIPITOR) 40 MG tablet Take 40 mg by mouth daily at 6 PM. Generic only  . BYSTOLIC 10 MG tablet Take 5 mg by mouth every morning.   . ferrous sulfate 325 (65 FE) MG tablet Take 325 mg by mouth daily.  . furosemide (LASIX) 40 MG tablet Take 1-2 tablets by mouth daily as needed for fluid  . glimepiride (AMARYL) 2 MG tablet Take 1 tablet by mouth daily.  Marland Kitchen HYDROcodone-acetaminophen (NORCO) 10-325 MG tablet Take 1 tablet by mouth as directed.   . isosorbide mononitrate (IMDUR) 30 MG 24 hr tablet Take 1 tablet (30 mg total) by mouth daily.  Marland Kitchen JANUVIA 100 MG tablet Take 100 mg by mouth daily.   . Lancets (ACCU-CHEK MULTICLIX) lancets   . losartan (COZAAR) 25 MG tablet Take 1 tablet (25 mg total) by mouth daily.  . metFORMIN (GLUCOPHAGE) 1000 MG tablet Take 1 tablet (1,000 mg total) by mouth 2 (two) times daily with a meal.  . Multiple Vitamins-Minerals (MULTIVITAMIN WITH MINERALS) tablet Take 1 tablet by mouth 2 (two) times daily.   . nitroGLYCERIN (NITROSTAT) 0.4 MG SL tablet Place 1 tablet (0.4 mg total) under the tongue every 5 (five) minutes as needed for chest pain.  Marland Kitchen Omeprazole-Sodium Bicarbonate (ZEGERID OTC PO) Take 1 capsule by mouth daily.  . potassium chloride SA (K-DUR,KLOR-CON) 20 MEQ tablet Take 10 mEq by mouth daily.   Marland Kitchen TIKOSYN 500 MCG capsule TAKE 1 CAPSULE (500 MCG TOTAL) BY MOUTH 2 (TWO) TIMES DAILY. CONSULT MD BEFORE TAKING ANY NEW MEDICATIONS. REPORT SIDE EFFECTS TO MD.  . XARELTO 20 MG TABS tablet Take 1 tablet by mouth daily.   No current facility-administered medications for this visit.     Allergies:   Patient has no known allergies.    Social History:  The patient  reports that he has quit smoking. His smoking use included Cigarettes. He has a 60.00 pack-year smoking history. He has never used smokeless tobacco. He reports that he drinks about 0.6 oz of alcohol per week . He reports that he does not use drugs.   Family History:  The patient's family history includes CAD in his father; Vascular Disease in his father.    ROS:  Please see the history of present illness. All other systems are reviewed and negative.    PHYSICAL EXAM: VS:  BP 139/87   Pulse 81   Ht 6' (1.829 m)   Wt 256 lb 12.8 oz (116.5 kg)   BMI 34.83 kg/m  , BMI Body mass index is 34.83 kg/m. GEN: Well nourished, well developed, male in no acute distress  HEENT: normal for age  Neck: no JVD, no carotid bruit, no masses Cardiac:  RRR; no murmur, no rubs, or gallops Respiratory:  clear to auscultation bilaterally, normal work of breathing GI: soft, nontender, nondistended, + BS MS: no deformity or atrophy; no edema; distal pulses are 2+ in all 4 extremities   Skin: warm and dry, no rash Neuro:  Strength and sensation are intact Psych: euthymic mood, full affect   EKG:  EKG is not ordered today.  Cardiac Cath  09/25/2016 Left Heart Cath and Cors/Grafts Angiography     Ost LM to LM lesion, 60 %stenosed.  Mid LAD lesion, 60 %stenosed. Patent LIMA to LAD.  Patent SVG to diagonal.  Patent SVG to moderately disease OM. The OM is patent back to the circumflex and provides some flow to the circumflex.  RPDA lesion, 80 %stenosed. Patent SVG to PDA. Early bifurcation of PDA, PLA.  The left ventricular systolic function is normal.  LV end diastolic pressure is mildly elevated.  There is no aortic valve stenosis.  Left main lesion could be intervened upon to improve flow to the circumflex, but this may disrupt flow in the other grafts. Therefore, this was deferred in favor of medical therapy. No clear culprit lesion for acute presentation. Negative enzymes at this point as well.  Continue aggressive medical therapy.    Diagnostic Diagram         Recent Labs: 09/24/2016: ALT 33; BUN 22; Creatinine, Ser 1.16; Potassium 3.9; Sodium 137; TSH 0.519 09/25/2016: Hemoglobin 12.3; Platelets 187   Lab Results  Component Value Date   HGBA1C 8.6 (H) 09/24/2016   Lipid Panel    Component Value Date/Time   CHOL 91 09/24/2016 1345   TRIG 97 09/24/2016 1345   HDL 23 (L) 09/24/2016 1345   CHOLHDL 4.0 09/24/2016 1345   VLDL 19 09/24/2016 1345   LDLCALC 49 09/24/2016 1345     Wt Readings from Last 3 Encounters:  10/14/16 256 lb 12.8 oz (116.5 kg)  09/25/16 249 lb 1.6 oz (113 kg)  08/05/16 260 lb (117.9 kg)     Other studies Reviewed: Additional studies/ records that were reviewed today include:  Office notes, hospital records and testing.  ASSESSMENT AND PLAN:  1.  CAD: He is having some angina. He feels this initially got a little bit better with the isosorbide, but has kind of gone back to what he feels is his baseline. However, he rarely gets episodes. He is on good medical therapy with a statin, ARB, and beta blocker. He is not on aspirin because of being on the Xarelto.  He is advised to contact us if the headaches get worse or if he just feels like he cannot tolerate the isosorbide anymore. We can always at that time see if he could get Ranexa. He understands there are other options besides the isosorbide and if that doesn't work for him, we can change it. He is a little frustrated because his symptoms were much improved the first couple of days he was out of the hospital but since then have gone back to similar levels where they were before he started therapy. He was encouraged to keep his activity level and hopefully the symptoms will decrease over time.  2. PAF: He is on the Tikosyn and is compliant with this. He has been having more leg cramps than usual so we will check a BMET today to make sure his electrolytes are stable. During his recent hospitalization, his potassium was only slightly low at 3.9, his magnesium was not checked. We will check both today.   Current medicines are reviewed at length with the patient today.  The patient does not have concerns regarding medicines.  The following changes have been made:  no change  Labs/ tests ordered today include:   Orders Placed This Encounter  Procedures  . Basic metabolic panel  . Magnesium     Disposition:   FU with [name]  Signed, [name] Demark, [name]  10/14/2016 10:42 AM  [place] Phone: [phone]; Fax: [phone]  This note was written with the assistance of speech recognition software. Please excuse any transcriptional errors.

## 2016-10-15 LAB — MAGNESIUM: Magnesium: 1.8 mg/dL (ref 1.5–2.5)

## 2016-10-15 NOTE — Progress Notes (Signed)
Thanks, we may have to stent his left main after all if meds do not work. MCr

## 2016-10-21 ENCOUNTER — Ambulatory Visit: Payer: Federal, State, Local not specified - PPO | Admitting: Internal Medicine

## 2016-11-11 ENCOUNTER — Other Ambulatory Visit: Payer: Self-pay | Admitting: Cardiovascular Disease

## 2016-12-23 ENCOUNTER — Ambulatory Visit: Payer: Federal, State, Local not specified - PPO | Admitting: Internal Medicine

## 2017-01-20 ENCOUNTER — Ambulatory Visit: Payer: Federal, State, Local not specified - PPO | Admitting: Internal Medicine

## 2017-03-01 ENCOUNTER — Other Ambulatory Visit: Payer: Self-pay | Admitting: Cardiovascular Disease

## 2017-03-04 NOTE — Telephone Encounter (Signed)
REFILL 

## 2017-03-17 ENCOUNTER — Encounter (INDEPENDENT_AMBULATORY_CARE_PROVIDER_SITE_OTHER): Payer: Self-pay

## 2017-03-17 ENCOUNTER — Ambulatory Visit (INDEPENDENT_AMBULATORY_CARE_PROVIDER_SITE_OTHER): Payer: Federal, State, Local not specified - PPO | Admitting: Internal Medicine

## 2017-03-17 ENCOUNTER — Encounter: Payer: Self-pay | Admitting: Internal Medicine

## 2017-03-17 VITALS — BP 130/84 | HR 67 | Ht 72.0 in | Wt 254.4 lb

## 2017-03-17 DIAGNOSIS — I2510 Atherosclerotic heart disease of native coronary artery without angina pectoris: Secondary | ICD-10-CM | POA: Diagnosis not present

## 2017-03-17 DIAGNOSIS — I10 Essential (primary) hypertension: Secondary | ICD-10-CM | POA: Diagnosis not present

## 2017-03-17 DIAGNOSIS — E669 Obesity, unspecified: Secondary | ICD-10-CM | POA: Diagnosis not present

## 2017-03-17 DIAGNOSIS — I4819 Other persistent atrial fibrillation: Secondary | ICD-10-CM

## 2017-03-17 DIAGNOSIS — I481 Persistent atrial fibrillation: Secondary | ICD-10-CM | POA: Diagnosis not present

## 2017-03-17 NOTE — Patient Instructions (Addendum)
Medication Instructions:  Your physician recommends that you continue on your current medications as directed. Please refer to the Current Medication list given to you today.   Labwork: None ordered   Testing/Procedures: None ordered   Follow-Up:  Your physician wants you to follow-up in: 6 months with Dr Ernest Pine and 12 months with the afib clinic You will receive a reminder letter in the mail two months in advance. If you don't receive a letter, please call our office to schedule the follow-up appointment.    Any Other Special Instructions Will Be Listed Below (If Applicable).   Low-Sodium Eating Plan---Dr Allred wants you to limit salt to 2,000 mg daily Sodium, which is an element that makes up salt, helps you maintain a healthy balance of fluids in your body. Too much sodium can increase your blood pressure and cause fluid and waste to be held in your body. Your health care provider or dietitian may recommend following this plan if you have high blood pressure (hypertension), kidney disease, liver disease, or heart failure. Eating less sodium can help lower your blood pressure, reduce swelling, and protect your heart, liver, and kidneys. What are tips for following this plan? General guidelines  Most people on this plan should limit their sodium intake to 1,500-2,000 mg (milligrams) of sodium each day. Reading food labels  The Nutrition Facts label lists the amount of sodium in one serving of the food. If you eat more than one serving, you must multiply the listed amount of sodium by the number of servings.  Choose foods with less than 140 mg of sodium per serving.  Avoid foods with 300 mg of sodium or more per serving. Shopping  Look for lower-sodium products, often labeled as "low-sodium" or "no salt added."  Always check the sodium content even if foods are labeled as "unsalted" or "no salt added".  Buy fresh foods. ? Avoid canned foods and premade or frozen  meals. ? Avoid canned, cured, or processed meats  Buy breads that have less than 80 mg of sodium per slice. Cooking  Eat more home-cooked food and less restaurant, buffet, and fast food.  Avoid adding salt when cooking. Use salt-free seasonings or herbs instead of table salt or sea salt. Check with your health care provider or pharmacist before using salt substitutes.  Cook with plant-based oils, such as canola, sunflower, or olive oil. Meal planning  When eating at a restaurant, ask that your food be prepared with less salt or no salt, if possible.  Avoid foods that contain MSG (monosodium glutamate). MSG is sometimes added to [place] food, bouillon, and some canned foods. What foods are recommended? The items listed may not be a complete list. Talk with your dietitian about what dietary choices are best for you. Grains Low-sodium cereals, including oats, puffed wheat and rice, and shredded wheat. Low-sodium crackers. Unsalted rice. Unsalted pasta. Low-sodium bread. Whole-grain breads and whole-grain pasta. Vegetables Fresh or frozen vegetables. "No salt added" canned vegetables. "No salt added" tomato sauce and paste. Low-sodium or reduced-sodium tomato and vegetable juice. Fruits Fresh, frozen, or canned fruit. Fruit juice. Meats and other protein foods Fresh or frozen (no salt added) meat, poultry, seafood, and fish. Low-sodium canned tuna and salmon. Unsalted nuts. Dried peas, beans, and lentils without added salt. Unsalted canned beans. Eggs. Unsalted nut butters. Dairy Milk. Soy milk. Cheese that is naturally low in sodium, such as ricotta cheese, fresh mozzarella, or Swiss cheese Low-sodium or reduced-sodium cheese. Cream cheese. Yogurt. Fats and oils  Unsalted butter. Unsalted margarine with no trans fat. Vegetable oils such as canola or olive oils. Seasonings and other foods Fresh and dried herbs and spices. Salt-free seasonings. Low-sodium mustard and ketchup. Sodium-free  salad dressing. Sodium-free light mayonnaise. Fresh or refrigerated horseradish. Lemon juice. Vinegar. Homemade, reduced-sodium, or low-sodium soups. Unsalted popcorn and pretzels. Low-salt or salt-free chips. What foods are not recommended? The items listed may not be a complete list. Talk with your dietitian about what dietary choices are best for you. Grains Instant hot cereals. Bread stuffing, pancake, and biscuit mixes. Croutons. Seasoned rice or pasta mixes. Noodle soup cups. Boxed or frozen macaroni and cheese. Regular salted crackers. Self-rising flour. Vegetables Sauerkraut, pickled vegetables, and relishes. Olives. [place] fries. Onion rings. Regular canned vegetables (not low-sodium or reduced-sodium). Regular canned tomato sauce and paste (not low-sodium or reduced-sodium). Regular tomato and vegetable juice (not low-sodium or reduced-sodium). Frozen vegetables in sauces. Meats and other protein foods Meat or fish that is salted, canned, smoked, spiced, or pickled. Bacon, ham, sausage, hotdogs, corned beef, chipped beef, packaged lunch meats, salt pork, jerky, pickled herring, anchovies, regular canned tuna, sardines, salted nuts. Dairy Processed cheese and cheese spreads. Cheese curds. Blue cheese. Feta cheese. String cheese. Regular cottage cheese. Buttermilk. Canned milk. Fats and oils Salted butter. Regular margarine. Ghee. Bacon fat. Seasonings and other foods Onion salt, garlic salt, seasoned salt, table salt, and sea salt. Canned and packaged gravies. Worcestershire sauce. Tartar sauce. Barbecue sauce. Teriyaki sauce. Soy sauce, including reduced-sodium. Steak sauce. Fish sauce. Oyster sauce. Cocktail sauce. Horseradish that you find on the shelf. Regular ketchup and mustard. Meat flavorings and tenderizers. Bouillon cubes. Hot sauce and Tabasco sauce. Premade or packaged marinades. Premade or packaged taco seasonings. Relishes. Regular salad dressings. Salsa. Potato and tortilla  chips. Corn chips and puffs. Salted popcorn and pretzels. Canned or dried soups. Pizza. Frozen entrees and pot pies. Summary  Eating less sodium can help lower your blood pressure, reduce swelling, and protect your heart, liver, and kidneys.  Most people on this plan should limit their sodium intake to 1,500-2,000 mg (milligrams) of sodium each day.  Canned, boxed, and frozen foods are high in sodium. Restaurant foods, fast foods, and pizza are also very high in sodium. You also get sodium by adding salt to food.  Try to cook at home, eat more fresh fruits and vegetables, and eat less fast food, canned, processed, or prepared foods. This information is not intended to replace advice given to you by your health care provider. Make sure you discuss any questions you have with your health care provider. Document Released: 12/07/2001 Document Revised: 06/10/2016 Document Reviewed: 06/10/2016 Elsevier Interactive Patient Education  2017 [employer].      If you need a refill on your cardiac medications before your next appointment, please call your pharmacy.

## 2017-03-17 NOTE — Progress Notes (Signed)
PCP: Gareth [place], MD Primary Cardiologist: Dr Royann Shivers Primary EP: Dr Johney Frame  [name] is a 56 y.o. male who presents today for routine electrophysiology followup.  Since last being seen in our clinic, the patient reports doing reasonably well.  He feels that his afib is mostly controlled.  He finds that if he has retained fluid that he notices it more.  + occasional edema.  Today, he denies symptoms of palpitations, chest pain, shortness of breath, dizziness, presyncope, or syncope.  The patient is otherwise without complaint today.   Past Medical History:  Diagnosis Date  . Abdominal distention   . Anemia    Postoperative  . Chronic diastolic CHF (congestive heart failure) (HCC)   . Coronary artery disease    a. premature - acute MI age 7 s/p BMS to prox LAD. b. s/p DES to RCA, PLA, mLAD in 2007. c. 4V CABG in 2008 with LIMA to LAD, SVG to first diagonal, SVG to first OM, SVG to PDA  . Diabetes (HCC)   . Edema   . H/O gastroesophageal reflux (GERD)   . High cholesterol   . Hypertension   . Myocardial infarction (HCC)   . Obesity   . Persistent atrial fibrillation (HCC)    a. on Tikosyn, Xarelto.  . Pleural effusion    resolved with therapy   Past Surgical History:  Procedure Laterality Date  . CARDIAC CATHETERIZATION  2008  . CORONARY ANGIOPLASTY  1996  . CORONARY ARTERY BYPASS GRAFT  2008   LIMA-LAD, SVG-Dx, SVG-OM1, SVG-PDA  . LEFT HEART CATH AND CORS/GRAFTS ANGIOGRAPHY N/A 09/25/2016   Procedure: Left Heart Cath and Cors/Grafts Angiography;  Surgeon: Corky Crafts, MD;  Location: [facility] INVASIVE CV LAB;  Service: Cardiovascular;  Laterality: N/A;    ROS- all systems are reviewed and negatives except as per HPI above  Current Outpatient Prescriptions  Medication Sig Dispense Refill  . ACCU-CHEK AVIVA PLUS test strip     . acetaminophen (TYLENOL) 325 MG tablet Take 2 tablets (650 mg total) by mouth every 4 (four) hours as needed for headache or mild pain.      Marland Kitchen atorvastatin (LIPITOR) 40 MG tablet Take 40 mg by mouth daily at 6 PM. Generic only    . BYSTOLIC 10 MG tablet Take 5 mg by mouth every morning.     . ferrous sulfate 325 (65 FE) MG tablet Take 325 mg by mouth daily.    . furosemide (LASIX) 40 MG tablet Take 1-2 tablets by mouth daily as needed for fluid    . glimepiride (AMARYL) 2 MG tablet Take 1 tablet by mouth daily.    Marland Kitchen HYDROcodone-acetaminophen (NORCO) 10-325 MG tablet Take 1 tablet by mouth as directed.    . isosorbide mononitrate (IMDUR) 30 MG 24 hr tablet TAKE ONE (1) TABLET BY MOUTH EVERY DAY 30 tablet 8  . JANUVIA 100 MG tablet Take 100 mg by mouth daily.     . Lancets (ACCU-CHEK MULTICLIX) lancets     . losartan (COZAAR) 25 MG tablet Take 1 tablet (25 mg total) by mouth daily. 90 tablet 4  . Magnesium 250 MG TABS Take 1 tablet by mouth daily.    . metFORMIN (GLUCOPHAGE) 1000 MG tablet Take 1 tablet (1,000 mg total) by mouth 2 (two) times daily with a meal.    . Multiple Vitamins-Minerals (MULTIVITAMIN WITH MINERALS) tablet Take 1 tablet by mouth 2 (two) times daily.     . nitroGLYCERIN (NITROSTAT) 0.4 MG SL tablet  Place 1 tablet (0.4 mg total) under the tongue every 5 (five) minutes as needed for chest pain. 25 tablet 4  . Omeprazole-Sodium Bicarbonate (ZEGERID OTC PO) Take 1 capsule by mouth daily.    . potassium chloride SA (K-DUR,KLOR-CON) 20 MEQ tablet Take 1 tablet (20 mEq total) by mouth daily. 90 tablet 3  . TIKOSYN 500 MCG capsule TAKE 1 CAPSULE (500 MCG TOTAL) BY MOUTH 2 TIMES DAILY. CONSULT MD BEFORE TAKING ANY NEW MEDICATIONS. REPORT SIDE EFFECTS TO MD. 60 capsule 2  . XARELTO 20 MG TABS tablet Take 1 tablet by mouth daily.     No current facility-administered medications for this visit.     Physical Exam: Vitals:   03/17/17 1017  BP: 130/84  Pulse: 67  SpO2: 97%  Weight: 254 lb 6.4 oz (115.4 kg)  Height: 6' (1.829 m)    GEN- The patient is overweightappearing, alert and oriented x 3 today.   Head-  normocephalic, atraumatic Eyes-  Sclera clear, conjunctiva pink Ears- hearing intact Oropharynx- clear Lungs- Clear to ausculation bilaterally, normal work of breathing Heart- Regular rate and rhythm, no murmurs, rubs or gallops, PMI not laterally displaced GI- soft, NT, ND, + BS Extremities- no clubbing, cyanosis, or edema  EKG tracing ordered today is personally reviewed and shows sinus rhythm 67 bpm, PR , Qtc 401 msec  Assessment and Plan:  1. Atrial fibrillation Well controlled with tikosyn We discussed ablation today He prefers to continue medical therapy.  PCP follows bmet, mg (last labs 2 weeks ago).  I have asked that he have these forwarded to [place]. chads2vasc score is 3.  He wishes to continue xarelto  2. HTN Stable No change required today  3. CAD No ischemic symptoms No changes  4. Obesity Body mass index is 34.5 kg/m. lifestyle modification encouraged  5. Chronic diastolic dysfunction 2 gram sodium diet He takes lasix prn  Follow-up with Dr Royann Shivers in 6 months Follow-up in AF clinic in a year I will see as needed going forward  Hillis Range MD, [facility] 03/17/2017 10:19 AM

## 2017-05-21 ENCOUNTER — Other Ambulatory Visit (HOSPITAL_COMMUNITY): Payer: Self-pay | Admitting: Family Medicine

## 2017-05-21 DIAGNOSIS — Z122 Encounter for screening for malignant neoplasm of respiratory organs: Secondary | ICD-10-CM

## 2017-06-02 ENCOUNTER — Ambulatory Visit (HOSPITAL_COMMUNITY)
Admission: RE | Admit: 2017-06-02 | Discharge: 2017-06-02 | Disposition: A | Discharge status: Home / Self Care | Payer: Federal, State, Local not specified - PPO | Source: Ambulatory Visit | Attending: Family Medicine | Admitting: Family Medicine

## 2017-06-02 DIAGNOSIS — I700 Atherosclerosis of aorta: Secondary | ICD-10-CM | POA: Insufficient documentation

## 2017-06-02 DIAGNOSIS — J439 Emphysema, unspecified: Secondary | ICD-10-CM | POA: Diagnosis not present

## 2017-06-02 DIAGNOSIS — Z122 Encounter for screening for malignant neoplasm of respiratory organs: Secondary | ICD-10-CM | POA: Diagnosis present

## 2017-06-04 ENCOUNTER — Ambulatory Visit (HOSPITAL_COMMUNITY)
Admission: RE | Admit: 2017-06-04 | Discharge: 2017-06-04 | Disposition: A | Discharge status: Home / Self Care | Payer: Federal, State, Local not specified - PPO | Source: Ambulatory Visit | Attending: Family Medicine | Admitting: Family Medicine

## 2017-06-04 ENCOUNTER — Other Ambulatory Visit (HOSPITAL_COMMUNITY): Payer: Self-pay | Admitting: Family Medicine

## 2017-06-04 DIAGNOSIS — R600 Localized edema: Secondary | ICD-10-CM

## 2017-06-04 DIAGNOSIS — R936 Abnormal findings on diagnostic imaging of limbs: Secondary | ICD-10-CM | POA: Diagnosis not present

## 2017-06-04 DIAGNOSIS — S82201D Unspecified fracture of shaft of right tibia, subsequent encounter for closed fracture with routine healing: Secondary | ICD-10-CM | POA: Insufficient documentation

## 2017-06-04 DIAGNOSIS — R609 Edema, unspecified: Secondary | ICD-10-CM

## 2017-06-04 DIAGNOSIS — R52 Pain, unspecified: Secondary | ICD-10-CM

## 2017-06-04 DIAGNOSIS — X58XXXD Exposure to other specified factors, subsequent encounter: Secondary | ICD-10-CM | POA: Insufficient documentation

## 2017-06-04 DIAGNOSIS — Z9889 Other specified postprocedural states: Secondary | ICD-10-CM | POA: Insufficient documentation

## 2017-06-04 DIAGNOSIS — M7989 Other specified soft tissue disorders: Secondary | ICD-10-CM | POA: Diagnosis not present

## 2017-06-04 DIAGNOSIS — M79605 Pain in left leg: Secondary | ICD-10-CM | POA: Diagnosis present

## 2017-06-04 DIAGNOSIS — S82401D Unspecified fracture of shaft of right fibula, subsequent encounter for closed fracture with routine healing: Secondary | ICD-10-CM | POA: Insufficient documentation

## 2017-06-13 ENCOUNTER — Other Ambulatory Visit: Payer: Self-pay

## 2017-06-13 MED ORDER — TIKOSYN 500 MCG PO CAPS: 500.0000 ug | ORAL_CAPSULE | Freq: Two times a day (BID) | ORAL | 5 refills | Status: DC

## 2017-06-13 NOTE — Telephone Encounter (Signed)
Rx(s) sent to pharmacy electronically per wife's request.

## 2017-06-17 ENCOUNTER — Other Ambulatory Visit: Payer: Self-pay | Admitting: *Deleted

## 2017-06-17 ENCOUNTER — Other Ambulatory Visit: Payer: Self-pay | Admitting: Cardiovascular Disease

## 2017-06-17 MED ORDER — TIKOSYN 500 MCG PO CAPS: 500.0000 ug | ORAL_CAPSULE | Freq: Two times a day (BID) | ORAL | 3 refills | Status: DC

## 2017-06-17 NOTE — Telephone Encounter (Signed)
°*  STAT* If patient is at the pharmacy, call can be transferred to refill team.   1. Which medications need to be refilled? (please list name of each medication and dose if known) [place] 500mg   2. Which pharmacy/location (including street and city if local pharmacy) is medication to be sent to? [facility] pharmacy   3. Do they need a 30 day or 90 day supply? 90

## 2017-06-17 NOTE — Telephone Encounter (Signed)
Rx has been sent to the pharmacy electronically. ° °

## 2017-07-28 ENCOUNTER — Telehealth: Payer: Self-pay | Admitting: Internal Medicine

## 2017-07-28 NOTE — Telephone Encounter (Signed)
New message   Patient c/o Palpitations:  High priority if patient c/o lightheadedness, shortness of breath, or chest pain  1) How long have you had palpitations/irregular HR/ Afib? Are you having the symptoms now?  For almost 2 weeks  2) Are you currently experiencing lightheadedness, SOB or CP? Dizziness due to meds and SOB sometimes  3) Do you have a history of afib (atrial fibrillation) or irregular heart rhythm? yes  4) Have you checked your BP or HR? (document readings if available): today it was 132/96 but last week it was 145/102 and 156/96  5) Are you experiencing any other symptoms? Fluid and tiredness  Patient wants to know if he would need to be seen by Allred before his appt with Croitoru on 3/25 due to symptoms please call

## 2017-07-28 NOTE — Telephone Encounter (Signed)
Called and talked with patients wife - pt is currently in jury duty today. His wife stated for the last week he has not felt well. Started checking his BP/HR and noticed elevated HRs in the 90s (HR is usually lower) also noticed increased lower extremity swelling. Pt is having some shortness of breath and fatigue. Appt made tentatively for Wednesday unless he does not have to serve with jury duty and wife will call to move up appointment. Will not change any medication doses since pt is rate controlled at this time. Wife will call if appt can be moved up.

## 2017-07-30 ENCOUNTER — Ambulatory Visit (HOSPITAL_COMMUNITY)
Admission: RE | Admit: 2017-07-30 | Discharge: 2017-07-30 | Disposition: A | Discharge status: Home / Self Care | Payer: Federal, State, Local not specified - PPO | Source: Ambulatory Visit | Attending: Nurse Practitioner | Admitting: Nurse Practitioner

## 2017-07-30 ENCOUNTER — Encounter (HOSPITAL_COMMUNITY): Payer: Self-pay | Admitting: Nurse Practitioner

## 2017-07-30 VITALS — BP 142/94 | HR 95 | Ht 72.0 in | Wt 260.0 lb

## 2017-07-30 DIAGNOSIS — I252 Old myocardial infarction: Secondary | ICD-10-CM | POA: Diagnosis not present

## 2017-07-30 DIAGNOSIS — Z8249 Family history of ischemic heart disease and other diseases of the circulatory system: Secondary | ICD-10-CM | POA: Diagnosis not present

## 2017-07-30 DIAGNOSIS — Z7984 Long term (current) use of oral hypoglycemic drugs: Secondary | ICD-10-CM | POA: Diagnosis not present

## 2017-07-30 DIAGNOSIS — I481 Persistent atrial fibrillation: Secondary | ICD-10-CM | POA: Diagnosis not present

## 2017-07-30 DIAGNOSIS — E119 Type 2 diabetes mellitus without complications: Secondary | ICD-10-CM | POA: Insufficient documentation

## 2017-07-30 DIAGNOSIS — I110 Hypertensive heart disease with heart failure: Secondary | ICD-10-CM | POA: Diagnosis not present

## 2017-07-30 DIAGNOSIS — E7800 Pure hypercholesterolemia, unspecified: Secondary | ICD-10-CM | POA: Diagnosis not present

## 2017-07-30 DIAGNOSIS — Z955 Presence of coronary angioplasty implant and graft: Secondary | ICD-10-CM | POA: Insufficient documentation

## 2017-07-30 DIAGNOSIS — Z7902 Long term (current) use of antithrombotics/antiplatelets: Secondary | ICD-10-CM | POA: Diagnosis not present

## 2017-07-30 DIAGNOSIS — D649 Anemia, unspecified: Secondary | ICD-10-CM | POA: Insufficient documentation

## 2017-07-30 DIAGNOSIS — I4819 Other persistent atrial fibrillation: Secondary | ICD-10-CM

## 2017-07-30 DIAGNOSIS — I25810 Atherosclerosis of coronary artery bypass graft(s) without angina pectoris: Secondary | ICD-10-CM | POA: Diagnosis not present

## 2017-07-30 DIAGNOSIS — Z87891 Personal history of nicotine dependence: Secondary | ICD-10-CM | POA: Insufficient documentation

## 2017-07-30 DIAGNOSIS — Z79899 Other long term (current) drug therapy: Secondary | ICD-10-CM | POA: Insufficient documentation

## 2017-07-30 DIAGNOSIS — Z951 Presence of aortocoronary bypass graft: Secondary | ICD-10-CM | POA: Insufficient documentation

## 2017-07-30 DIAGNOSIS — I480 Paroxysmal atrial fibrillation: Secondary | ICD-10-CM | POA: Insufficient documentation

## 2017-07-30 DIAGNOSIS — K219 Gastro-esophageal reflux disease without esophagitis: Secondary | ICD-10-CM | POA: Insufficient documentation

## 2017-07-30 DIAGNOSIS — J90 Pleural effusion, not elsewhere classified: Secondary | ICD-10-CM | POA: Insufficient documentation

## 2017-07-30 DIAGNOSIS — E669 Obesity, unspecified: Secondary | ICD-10-CM | POA: Diagnosis not present

## 2017-07-30 DIAGNOSIS — I5032 Chronic diastolic (congestive) heart failure: Secondary | ICD-10-CM | POA: Diagnosis not present

## 2017-07-30 DIAGNOSIS — Z9889 Other specified postprocedural states: Secondary | ICD-10-CM | POA: Diagnosis not present

## 2017-07-30 DIAGNOSIS — R9431 Abnormal electrocardiogram [ECG] [EKG]: Secondary | ICD-10-CM | POA: Diagnosis not present

## 2017-07-30 LAB — BASIC METABOLIC PANEL
ANION GAP: 9 (ref 5–15)
BUN: 19 mg/dL (ref 6–20)
CO2: 24 mmol/L (ref 22–32)
Calcium: 9.7 mg/dL (ref 8.9–10.3)
Chloride: 105 mmol/L (ref 101–111)
Creatinine, Ser: 0.95 mg/dL (ref 0.61–1.24)
GFR calc Af Amer: >60 mL/min (ref >60)
Glucose, Bld: 168 mg/dL — ABNORMAL HIGH (ref 65–99)
POTASSIUM: 4.6 mmol/L (ref 3.5–5.1)
SODIUM: 138 mmol/L (ref 135–145)

## 2017-07-30 LAB — CBC
HCT: 39.8 % (ref 39.0–52.0)
Hemoglobin: 12.9 g/dL — ABNORMAL LOW (ref 13.0–17.0)
MCH: 29 pg (ref 26.0–34.0)
MCHC: 32.4 g/dL (ref 30.0–36.0)
MCV: 89.4 fL (ref 78.0–100.0)
PLATELETS: 238 10*3/uL (ref 150–400)
RBC: 4.45 MIL/uL (ref 4.22–5.81)
RDW: 14 % (ref 11.5–15.5)
WBC: 11.2 10*3/uL — AB (ref 4.0–10.5)

## 2017-07-30 LAB — TSH: TSH: 0.947 u[IU]/mL (ref 0.350–4.500)

## 2017-07-30 LAB — MAGNESIUM: Magnesium: 1.9 mg/dL (ref 1.7–2.4)

## 2017-07-30 MED ORDER — NEBIVOLOL HCL 10 MG PO TABS: 10.0000 mg | ORAL_TABLET | ORAL | Status: DC

## 2017-07-30 NOTE — Patient Instructions (Addendum)
Your physician has recommended you make the following change in your medication: 1)Increase bystolic to full tablet a day 2)Increase lasix to 40mg   for 3 days  Call us once jury duty over

## 2017-07-30 NOTE — Progress Notes (Signed)
Primary Care Physician: Gareth Morgan, MD Referring Physician: Dr. Oletta Darter is a 56 y.o. male with a h/o paroxysmal afib that is in the afib clinic for f/u. He is in the afib clinic for feeling that he was out of rhythm since early January. He is on long term tikosyn since 2015, first episode to be in afib this long. Dr. Johney Frame has offered ablation in the past, but he deferred.  he feels that he has retained some fluid in the last couple of weeks. His weight is up 6 lbs. His HR in afib has been mostly no higher than the 90's. He has had a lot of fatigue since the first of December.  Today, he denies symptoms of palpitations, chest pain, shortness of breath, orthopnea, PND, lower extremity edema, dizziness, presyncope, syncope, or neurologic sequela. The patient is tolerating medications without difficulties and is otherwise without complaint today.   Past Medical History:  Diagnosis Date  . Abdominal distention   . Anemia    Postoperative  . Chronic diastolic CHF (congestive heart failure) (HCC)   . Coronary artery disease    a. premature - acute MI age 70 s/p BMS to prox LAD. b. s/p DES to RCA, PLA, mLAD in 2007. c. 4V CABG in 2008 with LIMA to LAD, SVG to first diagonal, SVG to first OM, SVG to PDA  . Diabetes (HCC)   . Edema   . H/O gastroesophageal reflux (GERD)   . High cholesterol   . Hypertension   . Myocardial infarction (HCC)   . Obesity   . Persistent atrial fibrillation (HCC)    a. on Tikosyn, Xarelto.  . Pleural effusion    resolved with therapy   Past Surgical History:  Procedure Laterality Date  . CARDIAC CATHETERIZATION  2008  . CORONARY ANGIOPLASTY  1996  . CORONARY ARTERY BYPASS GRAFT  2008   LIMA-LAD, SVG-Dx, SVG-OM1, SVG-PDA  . LEFT HEART CATH AND CORS/GRAFTS ANGIOGRAPHY N/A 09/25/2016   Procedure: Left Heart Cath and Cors/Grafts Angiography;  Surgeon: Corky Crafts, MD;  Location: [facility] INVASIVE CV LAB;  Service: Cardiovascular;   Laterality: N/A;    Current Outpatient Medications  Medication Sig Dispense Refill  . acetaminophen (TYLENOL) 325 MG tablet Take 2 tablets (650 mg total) by mouth every 4 (four) hours as needed for headache or mild pain.    Marland Kitchen atorvastatin (LIPITOR) 40 MG tablet Take 40 mg by mouth daily at 6 PM. Generic only    . ferrous sulfate 325 (65 FE) MG tablet Take 325 mg by mouth daily.    . furosemide (LASIX) 40 MG tablet Take 1-2 tablets by mouth daily as needed for fluid    . glimepiride (AMARYL) 2 MG tablet Take 1 tablet by mouth daily.    Marland Kitchen HYDROcodone-acetaminophen (NORCO) 10-325 MG tablet Take 1 tablet by mouth as directed.    . isosorbide mononitrate (IMDUR) 30 MG 24 hr tablet TAKE ONE (1) TABLET BY MOUTH EVERY DAY 30 tablet 8  . JANUVIA 100 MG tablet Take 100 mg by mouth daily.     . Lancets (ACCU-CHEK MULTICLIX) lancets     . losartan (COZAAR) 25 MG tablet Take 1 tablet (25 mg total) by mouth daily. 90 tablet 4  . Magnesium 250 MG TABS Take 1 tablet by mouth daily.    . metFORMIN (GLUCOPHAGE) 1000 MG tablet Take 1 tablet (1,000 mg total) by mouth 2 (two) times daily with a meal.    . Multiple  Vitamins-Minerals (MULTIVITAMIN WITH MINERALS) tablet Take 1 tablet by mouth 2 (two) times daily.     . nebivolol (BYSTOLIC) 10 MG tablet Take 1 tablet (10 mg total) by mouth every morning.    . nitroGLYCERIN (NITROSTAT) 0.4 MG SL tablet Place 1 tablet (0.4 mg total) under the tongue every 5 (five) minutes as needed for chest pain. 25 tablet 4  . potassium chloride SA (K-DUR,KLOR-CON) 20 MEQ tablet Take 1 tablet (20 mEq total) by mouth daily. 90 tablet 3  . TIKOSYN 500 MCG capsule Take 1 capsule (500 mcg total) by mouth 2 (two) times daily. Consult MD before taking any new medications. Report side effects to MD. 180 capsule 3  . XARELTO 20 MG TABS tablet Take 1 tablet by mouth daily.    Marland Kitchen. ACCU-CHEK AVIVA PLUS test strip     . Omeprazole-Sodium Bicarbonate (ZEGERID OTC PO) Take 1 capsule by mouth daily.      No current facility-administered medications for this encounter.     No Known Allergies  Social History   Socioeconomic History  . Marital status: Married    Spouse name: Not on file  . Number of children: Not on file  . Years of education: Not on file  . Highest education level: Not on file  Social Needs  . Financial resource strain: Not on file  . Food insecurity - worry: Not on file  . Food insecurity - inability: Not on file  . Transportation needs - medical: Not on file  . Transportation needs - non-medical: Not on file  Occupational History  . Occupation: Engineer, petroleumestaurant owner    Employer: [employer]  . Occupation: Has cattle and chickens.  Tobacco Use  . Smoking status: Former Smoker    Packs/day: 2.00    Years: 30.00    Pack years: 60.00    Types: Cigarettes  . Smokeless tobacco: Never Used  Substance and Sexual Activity  . Alcohol use: Yes    Alcohol/week: 0.6 oz    Types: 1 Standard drinks or equivalent per week    Comment: rare  . Drug use: No  . Sexual activity: Not on file  Other Topics Concern  . Not on file  Social History Narrative  . Not on file    Family History  Problem Relation Age of Onset  . CAD Father   . Vascular Disease Father        carotid artery disease    ROS- All systems are reviewed and negative except as per the HPI above  Physical Exam: Vitals:   07/30/17 1509  BP: (!) 142/94  Pulse: 95  Weight: 260 lb (117.9 kg)  Height: 6' (1.829 m)   Wt Readings from Last 3 Encounters:  07/30/17 260 lb (117.9 kg)  03/17/17 254 lb 6.4 oz (115.4 kg)  10/14/16 256 lb 12.8 oz (116.5 kg)    Labs: Lab Results  Component Value Date   NA 137 10/14/2016   K 4.6 10/14/2016   CL 102 10/14/2016   CO2 24 10/14/2016   GLUCOSE 268 (H) 10/14/2016   BUN 20 10/14/2016   CREATININE 0.88 10/14/2016   CALCIUM 9.5 10/14/2016   MG 1.8 10/14/2016   Lab Results  Component Value Date   INR 1.4 04/21/2007   Lab Results  Component Value  Date   CHOL 91 09/24/2016   HDL 23 (L) 09/24/2016   LDLCALC 49 09/24/2016   TRIG 97 09/24/2016     GEN- The patient is well appearing, alert and  oriented x 3 today.   Head- normocephalic, atraumatic Eyes-  Sclera clear, conjunctiva pink Ears- hearing intact Oropharynx- clear Neck- supple, no JVP Lymph- no cervical lymphadenopathy Lungs- Clear to ausculation bilaterally, normal work of breathing Heart- irregular rate and rhythm, no murmurs, rubs or gallops, PMI not laterally displaced GI- soft, NT, ND, + BS Extremities- no clubbing, cyanosis, or edema MS- no significant deformity or atrophy Skin- no rash or lesion Psych- euthymic mood, full affect Neuro- strength and sensation are intact  EKG- afib at 95 bpm, qrs int 84 ms, qtc 444 ms Epic records reviewed    Assessment and Plan: 1. Afib Triggers  reviewed but no obvious trigger Persistent since the first of January  Increase Bystolic to 10 mg daily for better rate control Increase lasix from 20 mg a day to 40 mg a day x 3 days from 6 lbs increase in weight Continue Tikosyn at 500 mcg bid Continue xarelto 20 mg daily, states no missed doses x at least 3 weeks, aware not to miss doses He would prefer cardioversion and if does not convert or ERAF, he will entertain ablation He is in the middle of jury duty and when he knows that it is finishing up, he will call to schedule cardioversion Bmet,cbc,tsh today    Lupita Leash [facility] [address] [place], [place] [address] [phone]

## 2017-08-20 ENCOUNTER — Telehealth (HOSPITAL_COMMUNITY): Payer: Self-pay | Admitting: *Deleted

## 2017-08-20 NOTE — Telephone Encounter (Signed)
Patient's wife called in today stating he has finished jury duty and still in afib. They have decided to possibly pursue ablation instead of cardioversion with their research. I have requested appt with Allred to further discuss ablation. Wife in agreement.

## 2017-08-21 ENCOUNTER — Telehealth: Payer: Self-pay | Admitting: Internal Medicine

## 2017-08-21 NOTE — Telephone Encounter (Signed)
New message    Patient spouse calling to discuss scheduling ablation.

## 2017-08-21 NOTE — Telephone Encounter (Signed)
Appt made for 2/25 with Dr. Johney FrameAllred to discuss ablation.

## 2017-08-21 NOTE — Telephone Encounter (Signed)
I spoke with patient's wife yesterday in regards to discussing ablation for failing tikosyn - informed patient wife will request appointment with Dr. Johney FrameAllred to discuss ablation - [facility]

## 2017-08-25 ENCOUNTER — Encounter: Payer: Self-pay | Admitting: Internal Medicine

## 2017-08-25 ENCOUNTER — Ambulatory Visit: Payer: Federal, State, Local not specified - PPO | Admitting: Internal Medicine

## 2017-08-25 VITALS — BP 124/64 | HR 85 | Ht 72.0 in | Wt 256.0 lb

## 2017-08-25 DIAGNOSIS — I10 Essential (primary) hypertension: Secondary | ICD-10-CM

## 2017-08-25 DIAGNOSIS — I2510 Atherosclerotic heart disease of native coronary artery without angina pectoris: Secondary | ICD-10-CM | POA: Diagnosis not present

## 2017-08-25 DIAGNOSIS — I481 Persistent atrial fibrillation: Secondary | ICD-10-CM

## 2017-08-25 DIAGNOSIS — I4891 Unspecified atrial fibrillation: Secondary | ICD-10-CM

## 2017-08-25 DIAGNOSIS — I5032 Chronic diastolic (congestive) heart failure: Secondary | ICD-10-CM | POA: Diagnosis not present

## 2017-08-25 DIAGNOSIS — I4819 Other persistent atrial fibrillation: Secondary | ICD-10-CM

## 2017-08-25 NOTE — Progress Notes (Signed)
PCP: Gareth Morgan, MD Primary Cardiologist: Dr Royann Shivers Primary EP: Dr Johney Frame  [name] is a 57 y.o. male who presents today for routine electrophysiology followup.  Since last being seen in our clinic, the patient reports doing reasonably well.  He has been in persistent afib since early January.  + fatigue and decreased exercise tolerance.  + SOB. Today, he denies symptoms of palpitations, chest pain, lower extremity edema, dizziness, presyncope, or syncope.  The patient is otherwise without complaint today.   Past Medical History:  Diagnosis Date  . Abdominal distention   . Anemia    Postoperative  . Chronic diastolic CHF (congestive heart failure) (HCC)   . Coronary artery disease    a. premature - acute MI age 44 s/p BMS to prox LAD. b. s/p DES to RCA, PLA, mLAD in 2007. c. 4V CABG in 2008 with LIMA to LAD, SVG to first diagonal, SVG to first OM, SVG to PDA  . Diabetes (HCC)   . Edema   . H/O gastroesophageal reflux (GERD)   . High cholesterol   . Hypertension   . Myocardial infarction (HCC)   . Obesity   . Persistent atrial fibrillation (HCC)    a. on Tikosyn, Xarelto.  . Pleural effusion    resolved with therapy   Past Surgical History:  Procedure Laterality Date  . CARDIAC CATHETERIZATION  2008  . CORONARY ANGIOPLASTY  1996  . CORONARY ARTERY BYPASS GRAFT  2008   LIMA-LAD, SVG-Dx, SVG-OM1, SVG-PDA  . LEFT HEART CATH AND CORS/GRAFTS ANGIOGRAPHY N/A 09/25/2016   Procedure: Left Heart Cath and Cors/Grafts Angiography;  Surgeon: Corky Crafts, MD;  Location: [facility] INVASIVE CV LAB;  Service: Cardiovascular;  Laterality: N/A;    ROS- all systems are reviewed and negatives except as per HPI above  Current Outpatient Medications  Medication Sig Dispense Refill  . ACCU-CHEK AVIVA PLUS test strip     . atorvastatin (LIPITOR) 40 MG tablet Take 40 mg by mouth daily at 6 PM. Generic only    . ferrous sulfate 325 (65 FE) MG tablet Take 325 mg by mouth daily.    .  furosemide (LASIX) 40 MG tablet Take 1-2 tablets by mouth daily as needed for fluid    . glimepiride (AMARYL) 2 MG tablet Take 1 tablet by mouth daily.    Marland Kitchen HYDROcodone-acetaminophen (NORCO) 10-325 MG tablet Take 1 tablet by mouth as directed.    . isosorbide mononitrate (IMDUR) 30 MG 24 hr tablet TAKE ONE (1) TABLET BY MOUTH EVERY DAY 30 tablet 8  . JANUVIA 100 MG tablet Take 100 mg by mouth daily.     . Lancets (ACCU-CHEK MULTICLIX) lancets     . losartan (COZAAR) 25 MG tablet Take 1 tablet (25 mg total) by mouth daily. 90 tablet 4  . Magnesium 250 MG TABS Take 1 tablet by mouth daily.    . metFORMIN (GLUCOPHAGE) 1000 MG tablet Take 1 tablet (1,000 mg total) by mouth 2 (two) times daily with a meal.    . Multiple Vitamins-Minerals (MULTIVITAMIN WITH MINERALS) tablet Take 1 tablet by mouth 2 (two) times daily.     . nebivolol (BYSTOLIC) 10 MG tablet Take 1 tablet (10 mg total) by mouth every morning.    . nitroGLYCERIN (NITROSTAT) 0.4 MG SL tablet Place 1 tablet (0.4 mg total) under the tongue every 5 (five) minutes as needed for chest pain. 25 tablet 4  . Omeprazole-Sodium Bicarbonate (ZEGERID OTC PO) Take 1 capsule by mouth daily.    Marland Kitchen  potassium chloride SA (K-DUR,KLOR-CON) 20 MEQ tablet Take 1 tablet (20 mEq total) by mouth daily. 90 tablet 3  . TIKOSYN 500 MCG capsule Take 1 capsule (500 mcg total) by mouth 2 (two) times daily. Consult MD before taking any new medications. Report side effects to MD. 180 capsule 3  . XARELTO 20 MG TABS tablet Take 1 tablet by mouth daily.    Marland Kitchen. acetaminophen (TYLENOL) 325 MG tablet Take 2 tablets (650 mg total) by mouth every 4 (four) hours as needed for headache or mild pain. (Patient not taking: Reported on 08/25/2017)     No current facility-administered medications for this visit.     Physical Exam: Vitals:   08/25/17 1154  BP: 124/64  Pulse: 85  Weight: 256 lb (116.1 kg)  Height: 6' (1.829 m)    GEN- The patient is overweight appearing, alert and  oriented x 3 today.   Head- normocephalic, atraumatic Eyes-  Sclera clear, conjunctiva pink Ears- hearing intact Oropharynx- clear Lungs- Clear to ausculation bilaterally, normal work of breathing Heart- irregular rate and rhythm, no murmurs, rubs or gallops, PMI not laterally displaced GI- soft, NT, ND, + BS Extremities- no clubbing, cyanosis, or edema  EKG tracing ordered today is personally reviewed and shows afib, V rate 85 bpm, Qtc 461 msec  Assessment and Plan:  1. Persistent afib Failed medical therapy with tikosyn Symptomatic chads2vasc score is 3.  On xarelto Therapeutic strategies for afib including medicine and ablation were discussed in detail with the patient today. Risk, benefits, and alternatives to EP study and radiofrequency ablation for afib were also discussed in detail today. These risks include but are not limited to stroke, bleeding, vascular damage, tamponade, perforation, damage to the esophagus, lungs, and other structures, pulmonary vein stenosis, worsening renal function, and death. The patient understands these risk and wishes to proceed.  We will therefore proceed with catheter ablation at the next available time.  Will obtain cardiac CT prior to ablation to evaluate for LAA thrombus.  2. HTN Stable No change required today  3. CAD No ischemic symptoms No changes  4. Obesity Body mass index is 34.72 kg/m. Lifestyle modification encouraged  5. Chronic diastolic dysfunction Stable No change required today   Hillis RangeJames Macarius Ruark MD, [facility] 08/25/2017 12:13 PM

## 2017-08-25 NOTE — H&P (View-Only) (Signed)
PCP: Gareth Morgan, MD Primary Cardiologist: Dr Royann Shivers Primary EP: Dr Johney Frame  [name] is a 57 y.o. male who presents today for routine electrophysiology followup.  Since last being seen in our clinic, the patient reports doing reasonably well.  He has been in persistent afib since early January.  + fatigue and decreased exercise tolerance.  + SOB. Today, he denies symptoms of palpitations, chest pain, lower extremity edema, dizziness, presyncope, or syncope.  The patient is otherwise without complaint today.   Past Medical History:  Diagnosis Date  . Abdominal distention   . Anemia    Postoperative  . Chronic diastolic CHF (congestive heart failure) (HCC)   . Coronary artery disease    a. premature - acute MI age 44 s/p BMS to prox LAD. b. s/p DES to RCA, PLA, mLAD in 2007. c. 4V CABG in 2008 with LIMA to LAD, SVG to first diagonal, SVG to first OM, SVG to PDA  . Diabetes (HCC)   . Edema   . H/O gastroesophageal reflux (GERD)   . High cholesterol   . Hypertension   . Myocardial infarction (HCC)   . Obesity   . Persistent atrial fibrillation (HCC)    a. on Tikosyn, Xarelto.  . Pleural effusion    resolved with therapy   Past Surgical History:  Procedure Laterality Date  . CARDIAC CATHETERIZATION  2008  . CORONARY ANGIOPLASTY  1996  . CORONARY ARTERY BYPASS GRAFT  2008   LIMA-LAD, SVG-Dx, SVG-OM1, SVG-PDA  . LEFT HEART CATH AND CORS/GRAFTS ANGIOGRAPHY N/A 09/25/2016   Procedure: Left Heart Cath and Cors/Grafts Angiography;  Surgeon: Corky Crafts, MD;  Location: [facility] INVASIVE CV LAB;  Service: Cardiovascular;  Laterality: N/A;    ROS- all systems are reviewed and negatives except as per HPI above  Current Outpatient Medications  Medication Sig Dispense Refill  . ACCU-CHEK AVIVA PLUS test strip     . atorvastatin (LIPITOR) 40 MG tablet Take 40 mg by mouth daily at 6 PM. Generic only    . ferrous sulfate 325 (65 FE) MG tablet Take 325 mg by mouth daily.    .  furosemide (LASIX) 40 MG tablet Take 1-2 tablets by mouth daily as needed for fluid    . glimepiride (AMARYL) 2 MG tablet Take 1 tablet by mouth daily.    Marland Kitchen HYDROcodone-acetaminophen (NORCO) 10-325 MG tablet Take 1 tablet by mouth as directed.    . isosorbide mononitrate (IMDUR) 30 MG 24 hr tablet TAKE ONE (1) TABLET BY MOUTH EVERY DAY 30 tablet 8  . JANUVIA 100 MG tablet Take 100 mg by mouth daily.     . Lancets (ACCU-CHEK MULTICLIX) lancets     . losartan (COZAAR) 25 MG tablet Take 1 tablet (25 mg total) by mouth daily. 90 tablet 4  . Magnesium 250 MG TABS Take 1 tablet by mouth daily.    . metFORMIN (GLUCOPHAGE) 1000 MG tablet Take 1 tablet (1,000 mg total) by mouth 2 (two) times daily with a meal.    . Multiple Vitamins-Minerals (MULTIVITAMIN WITH MINERALS) tablet Take 1 tablet by mouth 2 (two) times daily.     . nebivolol (BYSTOLIC) 10 MG tablet Take 1 tablet (10 mg total) by mouth every morning.    . nitroGLYCERIN (NITROSTAT) 0.4 MG SL tablet Place 1 tablet (0.4 mg total) under the tongue every 5 (five) minutes as needed for chest pain. 25 tablet 4  . Omeprazole-Sodium Bicarbonate (ZEGERID OTC PO) Take 1 capsule by mouth daily.    Marland Kitchen  potassium chloride SA (K-DUR,KLOR-CON) 20 MEQ tablet Take 1 tablet (20 mEq total) by mouth daily. 90 tablet 3  . TIKOSYN 500 MCG capsule Take 1 capsule (500 mcg total) by mouth 2 (two) times daily. Consult MD before taking any new medications. Report side effects to MD. 180 capsule 3  . XARELTO 20 MG TABS tablet Take 1 tablet by mouth daily.    Marland Kitchen. acetaminophen (TYLENOL) 325 MG tablet Take 2 tablets (650 mg total) by mouth every 4 (four) hours as needed for headache or mild pain. (Patient not taking: Reported on 08/25/2017)     No current facility-administered medications for this visit.     Physical Exam: Vitals:   08/25/17 1154  BP: 124/64  Pulse: 85  Weight: 256 lb (116.1 kg)  Height: 6' (1.829 m)    GEN- The patient is overweight appearing, alert and  oriented x 3 today.   Head- normocephalic, atraumatic Eyes-  Sclera clear, conjunctiva pink Ears- hearing intact Oropharynx- clear Lungs- Clear to ausculation bilaterally, normal work of breathing Heart- irregular rate and rhythm, no murmurs, rubs or gallops, PMI not laterally displaced GI- soft, NT, ND, + BS Extremities- no clubbing, cyanosis, or edema  EKG tracing ordered today is personally reviewed and shows afib, V rate 85 bpm, Qtc 461 msec  Assessment and Plan:  1. Persistent afib Failed medical therapy with tikosyn Symptomatic chads2vasc score is 3.  On xarelto Therapeutic strategies for afib including medicine and ablation were discussed in detail with the patient today. Risk, benefits, and alternatives to EP study and radiofrequency ablation for afib were also discussed in detail today. These risks include but are not limited to stroke, bleeding, vascular damage, tamponade, perforation, damage to the esophagus, lungs, and other structures, pulmonary vein stenosis, worsening renal function, and death. The patient understands these risk and wishes to proceed.  We will therefore proceed with catheter ablation at the next available time.  Will obtain cardiac CT prior to ablation to evaluate for LAA thrombus.  2. HTN Stable No change required today  3. CAD No ischemic symptoms No changes  4. Obesity Body mass index is 34.72 kg/m. Lifestyle modification encouraged  5. Chronic diastolic dysfunction Stable No change required today   Hillis RangeJames Semisi Biela MD, [facility] 08/25/2017 12:13 PM

## 2017-08-25 NOTE — Patient Instructions (Addendum)
Medication Instructions:  Your physician recommends that you continue on your current medications as directed. Please refer to the Current Medication list given to you today. If you need a refill on your cardiac medications before your next appointment, please call your pharmacy.  Labwork: You will get lab work within 14 days of your procedure:  BMP and CBC. Labs entered so they may be drawn in [place].  Pt aware not to have drawn until within 14 days of ablation.  Testing/Procedures:  Your physician has requested that you have cardiac CT. Cardiac computed tomography (CT) is a painless test that uses an x-ray machine to take clear, detailed pictures of your heart. For further information please visit [email or web address]. Please follow instruction sheet as given.-You will get a call from our office to schedule the date for this test.  Your physician has recommended that you have an ablation. Catheter ablation is a medical procedure used to treat some cardiac arrhythmias (irregular heartbeats). During catheter ablation, a long, thin, flexible tube is put into a blood vessel in your groin (upper thigh), or neck. This tube is called an ablation catheter. It is then guided to your heart through the blood vessel. Radio frequency waves destroy small areas of heart tissue where abnormal heartbeats may cause an arrhythmia to start. Please see the instruction sheet given to you today.  Follow-Up: You will follow up with Rudi Cocoonna Carroll, NP with the afib clinic 4 weeks after your ablation.  You will follow up with Dr. Johney FrameAllred 3 months after your procedure.  Any Other Special Instructions Will Be Listed Below (If Applicable).  Ablation directions: [facility] at: 7:30 am on September 23, 2017. Do not eat or drink after midnight prior to procedure On the morning of your procedure take only your Xarelto with a sip of water. Plan for one night stay.   You will need someone to drive you home at discharge.  Please arrive at the [facility] main entrance of [facility] [address] [place], [place] [address] [phone]  Proceed to the [facility] Radiology Department (First Floor).  Please follow these instructions carefully (unless otherwise directed):  Hold all erectile dysfunction medications at least 48 hours prior to test.  On the Night Before the Test: . Drink plenty of water. . Do not consume any caffeinated/decaffeinated beverages or chocolate 12 hours prior to your test. . Do not take any antihistamines 12 hours prior to your test. . If you take Metformin do not take 24 hours prior to test. On the Day of the Test: . Drink plenty of water. Do not drink any water within one hour of the test. . Do not eat any food 4 hours prior to the test. . You may take your regular medications prior to the test. . IF NOT ON A BETA BLOCKER - Take 50 mg of lopressor (metoprolol) one hour before the test.  You are on a beta blocker.  Take your nebivolol prior to this procedure. Marland Kitchen. HOLD Furosemide morning of the test.  After the Test: . Drink plenty of water. . After receiving IV contrast, you may experience a mild flushed feeling. This is normal. . On occasion, you may experience a mild rash up to 24 hours after the test. This is not dangerous. If this occurs, you can take Benadryl 25 mg and increase your fluid intake. . If you experience trouble breathing, this can be  serious. If it is severe call 911 IMMEDIATELY. If it is mild, please call our office. . If you take any of these medications: Glipizide/Metformin, Avandament, Glucavance, please do not take 48 hours after completing test.

## 2017-08-27 NOTE — Addendum Note (Signed)
Addended by: Roney MansSMITH, Mliss Wedin A on: 08/27/2017 12:15 PM   Modules accepted: Orders

## 2017-09-18 ENCOUNTER — Other Ambulatory Visit: Payer: Self-pay | Admitting: Internal Medicine

## 2017-09-19 ENCOUNTER — Ambulatory Visit (HOSPITAL_COMMUNITY): Admission: RE | Admit: 2017-09-19 | Payer: Federal, State, Local not specified - PPO | Source: Ambulatory Visit

## 2017-09-19 ENCOUNTER — Ambulatory Visit (HOSPITAL_COMMUNITY)
Admission: RE | Admit: 2017-09-19 | Discharge: 2017-09-19 | Disposition: A | Discharge status: Home / Self Care | Payer: Federal, State, Local not specified - PPO | Source: Ambulatory Visit | Attending: Internal Medicine | Admitting: Internal Medicine

## 2017-09-19 ENCOUNTER — Encounter (HOSPITAL_COMMUNITY): Payer: Self-pay

## 2017-09-19 DIAGNOSIS — I700 Atherosclerosis of aorta: Secondary | ICD-10-CM | POA: Diagnosis not present

## 2017-09-19 DIAGNOSIS — I481 Persistent atrial fibrillation: Secondary | ICD-10-CM | POA: Diagnosis not present

## 2017-09-19 DIAGNOSIS — I4819 Other persistent atrial fibrillation: Secondary | ICD-10-CM

## 2017-09-19 LAB — CBC/DIFF AMBIGUOUS DEFAULT
BASOS ABS: 0.1 10*3/uL (ref 0.0–0.2)
BASOS: 0 %
EOS (ABSOLUTE): 0.1 10*3/uL (ref 0.0–0.4)
EOS: 1 %
HEMATOCRIT: 37.3 % — AB (ref 37.5–51.0)
HEMOGLOBIN: 12.4 g/dL — AB (ref 13.0–17.7)
Immature Grans (Abs): 0 10*3/uL (ref 0.0–0.1)
Immature Granulocytes: 0 %
LYMPHS ABS: 2.1 10*3/uL (ref 0.7–3.1)
Lymphs: 17 %
MCH: 28.4 pg (ref 26.6–33.0)
MCHC: 33.2 g/dL (ref 31.5–35.7)
MCV: 86 fL (ref 79–97)
MONOCYTES: 5 %
Monocytes Absolute: 0.6 10*3/uL (ref 0.1–0.9)
NEUTROS ABS: 9.5 10*3/uL — AB (ref 1.4–7.0)
Neutrophils: 77 %
Platelets: 233 10*3/uL (ref 150–379)
RBC: 4.36 x10E6/uL (ref 4.14–5.80)
RDW: 15 % (ref 12.3–15.4)
WBC: 12.3 10*3/uL — ABNORMAL HIGH (ref 3.4–10.8)

## 2017-09-19 LAB — BASIC METABOLIC PANEL
BUN / CREAT RATIO: 20 (ref 9–20)
BUN: 21 mg/dL (ref 6–24)
CO2: 23 mmol/L (ref 20–29)
CREATININE: 1.07 mg/dL (ref 0.76–1.27)
Calcium: 9.3 mg/dL (ref 8.7–10.2)
Chloride: 99 mmol/L (ref 96–106)
GFR calc Af Amer: 90 mL/min/{1.73_m2} (ref >59)
GFR, EST NON AFRICAN AMERICAN: 78 mL/min/{1.73_m2} (ref >59)
Glucose: 246 mg/dL — ABNORMAL HIGH (ref 65–99)
Potassium: 4.5 mmol/L (ref 3.5–5.2)
SODIUM: 137 mmol/L (ref 134–144)

## 2017-09-19 LAB — SPECIMEN STATUS REPORT

## 2017-09-19 LAB — POCT I-STAT CREATININE: Creatinine, Ser: 1 mg/dL (ref 0.61–1.24)

## 2017-09-19 MED ORDER — IOPAMIDOL (ISOVUE-370) INJECTION 76%
INTRAVENOUS | Status: AC
  Administered 2017-09-19: 80 mL
  Filled 2017-09-19: qty 100

## 2017-09-22 ENCOUNTER — Ambulatory Visit: Payer: Federal, State, Local not specified - PPO | Admitting: Cardiovascular Disease

## 2017-09-23 ENCOUNTER — Ambulatory Visit (HOSPITAL_COMMUNITY): Payer: Federal, State, Local not specified - PPO | Admitting: Certified Registered Nurse Anesthetist

## 2017-09-23 ENCOUNTER — Encounter (HOSPITAL_COMMUNITY): Payer: Self-pay | Admitting: General Practice

## 2017-09-23 ENCOUNTER — Other Ambulatory Visit: Payer: Self-pay

## 2017-09-23 ENCOUNTER — Encounter (HOSPITAL_COMMUNITY)
Admission: RE | Disposition: A | Discharge status: Home / Self Care | Payer: Self-pay | Source: Ambulatory Visit | Attending: Internal Medicine

## 2017-09-23 ENCOUNTER — Ambulatory Visit (HOSPITAL_COMMUNITY)
Admission: RE | Admit: 2017-09-23 | Discharge: 2017-09-24 | Disposition: A | Discharge status: Home / Self Care | Payer: Federal, State, Local not specified - PPO | Source: Ambulatory Visit | Attending: Internal Medicine | Admitting: Internal Medicine

## 2017-09-23 DIAGNOSIS — I2510 Atherosclerotic heart disease of native coronary artery without angina pectoris: Secondary | ICD-10-CM | POA: Insufficient documentation

## 2017-09-23 DIAGNOSIS — I252 Old myocardial infarction: Secondary | ICD-10-CM | POA: Diagnosis not present

## 2017-09-23 DIAGNOSIS — Z7984 Long term (current) use of oral hypoglycemic drugs: Secondary | ICD-10-CM | POA: Diagnosis not present

## 2017-09-23 DIAGNOSIS — E7800 Pure hypercholesterolemia, unspecified: Secondary | ICD-10-CM | POA: Insufficient documentation

## 2017-09-23 DIAGNOSIS — Z79891 Long term (current) use of opiate analgesic: Secondary | ICD-10-CM | POA: Insufficient documentation

## 2017-09-23 DIAGNOSIS — I481 Persistent atrial fibrillation: Secondary | ICD-10-CM | POA: Insufficient documentation

## 2017-09-23 DIAGNOSIS — I5032 Chronic diastolic (congestive) heart failure: Secondary | ICD-10-CM | POA: Insufficient documentation

## 2017-09-23 DIAGNOSIS — Z951 Presence of aortocoronary bypass graft: Secondary | ICD-10-CM | POA: Insufficient documentation

## 2017-09-23 DIAGNOSIS — Z87891 Personal history of nicotine dependence: Secondary | ICD-10-CM | POA: Insufficient documentation

## 2017-09-23 DIAGNOSIS — Z7901 Long term (current) use of anticoagulants: Secondary | ICD-10-CM | POA: Insufficient documentation

## 2017-09-23 DIAGNOSIS — I110 Hypertensive heart disease with heart failure: Secondary | ICD-10-CM | POA: Diagnosis not present

## 2017-09-23 DIAGNOSIS — Z6834 Body mass index (BMI) 34.0-34.9, adult: Secondary | ICD-10-CM | POA: Insufficient documentation

## 2017-09-23 DIAGNOSIS — K219 Gastro-esophageal reflux disease without esophagitis: Secondary | ICD-10-CM | POA: Diagnosis not present

## 2017-09-23 DIAGNOSIS — Z79899 Other long term (current) drug therapy: Secondary | ICD-10-CM | POA: Insufficient documentation

## 2017-09-23 DIAGNOSIS — E119 Type 2 diabetes mellitus without complications: Secondary | ICD-10-CM | POA: Diagnosis not present

## 2017-09-23 DIAGNOSIS — E669 Obesity, unspecified: Secondary | ICD-10-CM | POA: Diagnosis not present

## 2017-09-23 DIAGNOSIS — I4819 Other persistent atrial fibrillation: Secondary | ICD-10-CM | POA: Diagnosis present

## 2017-09-23 HISTORY — PX: ATRIAL FIBRILLATION ABLATION: EP1191

## 2017-09-23 HISTORY — PX: ABLATION OF DYSRHYTHMIC FOCUS: SHX254

## 2017-09-23 LAB — POCT ACTIVATED CLOTTING TIME
ACTIVATED CLOTTING TIME: 235 s
Activated Clotting Time: 208 seconds
Activated Clotting Time: 213 seconds

## 2017-09-23 LAB — GLUCOSE, CAPILLARY
GLUCOSE-CAPILLARY: 191 mg/dL — AB (ref 65–99)
GLUCOSE-CAPILLARY: 207 mg/dL — AB (ref 65–99)
Glucose-Capillary: 266 mg/dL — ABNORMAL HIGH (ref 65–99)
Glucose-Capillary: 244 mg/dL — ABNORMAL HIGH (ref 65–99)

## 2017-09-23 SURGERY — ATRIAL FIBRILLATION ABLATION
Anesthesia: General

## 2017-09-23 MED ORDER — ONDANSETRON HCL 4 MG/2ML IJ SOLN: 4.0000 mg | Freq: Once | INTRAMUSCULAR | Status: DC | PRN

## 2017-09-23 MED ORDER — SODIUM CHLORIDE 0.9% FLUSH
3.0000 mL | Freq: Two times a day (BID) | INTRAVENOUS | Status: DC
  Administered 2017-09-23: 3 mL via INTRAVENOUS

## 2017-09-23 MED ORDER — ACETAMINOPHEN 325 MG PO TABS: 650.0000 mg | ORAL_TABLET | ORAL | Status: DC | PRN

## 2017-09-23 MED ORDER — PHENYLEPHRINE HCL 10 MG/ML IJ SOLN
INTRAMUSCULAR | Status: DC | PRN
  Administered 2017-09-23: 120 ug via INTRAVENOUS
  Administered 2017-09-23 (×2): 80 ug via INTRAVENOUS
  Administered 2017-09-23: 40 ug via INTRAVENOUS

## 2017-09-23 MED ORDER — NEBIVOLOL HCL 5 MG PO TABS
10.0000 mg | ORAL_TABLET | ORAL | Status: DC
  Administered 2017-09-24: 10 mg via ORAL
  Filled 2017-09-23: qty 2

## 2017-09-23 MED ORDER — MIDAZOLAM HCL 5 MG/5ML IJ SOLN
INTRAMUSCULAR | Status: DC | PRN
  Administered 2017-09-23: 2 mg via INTRAVENOUS

## 2017-09-23 MED ORDER — SODIUM CHLORIDE 0.9% FLUSH: 3.0000 mL | INTRAVENOUS | Status: DC | PRN

## 2017-09-23 MED ORDER — DOFETILIDE 500 MCG PO CAPS
500.0000 ug | ORAL_CAPSULE | Freq: Two times a day (BID) | ORAL | Status: DC
  Administered 2017-09-23 – 2017-09-24 (×2): 500 ug via ORAL
  Filled 2017-09-23 (×2): qty 1

## 2017-09-23 MED ORDER — ACETAMINOPHEN 325 MG PO TABS: 325.0000 mg | ORAL_TABLET | ORAL | Status: DC | PRN

## 2017-09-23 MED ORDER — ISOSORBIDE MONONITRATE ER 30 MG PO TB24
30.0000 mg | ORAL_TABLET | Freq: Every day | ORAL | Status: DC
  Administered 2017-09-24: 30 mg via ORAL
  Filled 2017-09-23: qty 1

## 2017-09-23 MED ORDER — HEPARIN SODIUM (PORCINE) 1000 UNIT/ML IJ SOLN
INTRAMUSCULAR | Status: DC | PRN
  Administered 2017-09-23 (×2): 1000 [IU] via INTRAVENOUS
  Administered 2017-09-23: 12000 [IU] via INTRAVENOUS

## 2017-09-23 MED ORDER — LOSARTAN POTASSIUM 25 MG PO TABS
25.0000 mg | ORAL_TABLET | Freq: Every day | ORAL | Status: DC
  Administered 2017-09-24: 25 mg via ORAL
  Filled 2017-09-23: qty 1

## 2017-09-23 MED ORDER — ONDANSETRON HCL 4 MG/2ML IJ SOLN: 4.0000 mg | Freq: Four times a day (QID) | INTRAMUSCULAR | Status: DC | PRN

## 2017-09-23 MED ORDER — PROTAMINE SULFATE 10 MG/ML IV SOLN
INTRAVENOUS | Status: DC | PRN
  Administered 2017-09-23: 30 mg via INTRAVENOUS

## 2017-09-23 MED ORDER — HEPARIN (PORCINE) IN NACL 2-0.9 UNIT/ML-% IJ SOLN
INTRAMUSCULAR | Status: AC | PRN
  Administered 2017-09-23: 500 mL

## 2017-09-23 MED ORDER — MEPERIDINE HCL 25 MG/ML IJ SOLN: 6.2500 mg | INTRAMUSCULAR | Status: DC | PRN

## 2017-09-23 MED ORDER — OXYCODONE HCL 5 MG PO TABS: 5.0000 mg | ORAL_TABLET | Freq: Once | ORAL | Status: DC | PRN

## 2017-09-23 MED ORDER — PROPOFOL 10 MG/ML IV BOLUS
INTRAVENOUS | Status: DC | PRN
  Administered 2017-09-23: 30 mg via INTRAVENOUS
  Administered 2017-09-23: 150 mg via INTRAVENOUS

## 2017-09-23 MED ORDER — FUROSEMIDE 40 MG PO TABS
40.0000 mg | ORAL_TABLET | Freq: Every day | ORAL | Status: DC
  Administered 2017-09-24: 40 mg via ORAL
  Filled 2017-09-23: qty 1

## 2017-09-23 MED ORDER — GLIMEPIRIDE 1 MG PO TABS
2.0000 mg | ORAL_TABLET | Freq: Every day | ORAL | Status: DC
  Administered 2017-09-24: 2 mg via ORAL
  Filled 2017-09-23: qty 2

## 2017-09-23 MED ORDER — MAGNESIUM OXIDE 400 (241.3 MG) MG PO TABS
400.0000 mg | ORAL_TABLET | Freq: Every day | ORAL | Status: DC
  Administered 2017-09-24: 400 mg via ORAL
  Filled 2017-09-23: qty 1

## 2017-09-23 MED ORDER — IOPAMIDOL (ISOVUE-370) INJECTION 76%
INTRAVENOUS | Status: AC
  Filled 2017-09-23: qty 50

## 2017-09-23 MED ORDER — SUGAMMADEX SODIUM 200 MG/2ML IV SOLN
INTRAVENOUS | Status: DC | PRN
  Administered 2017-09-23: 200 mg via INTRAVENOUS

## 2017-09-23 MED ORDER — SODIUM CHLORIDE 0.9 % IV SOLN: 250.0000 mL | INTRAVENOUS | Status: DC | PRN

## 2017-09-23 MED ORDER — ACETAMINOPHEN 160 MG/5ML PO SOLN: 325.0000 mg | ORAL | Status: DC | PRN

## 2017-09-23 MED ORDER — IOPAMIDOL (ISOVUE-370) INJECTION 76%
INTRAVENOUS | Status: DC | PRN
  Administered 2017-09-23: 3 mL via INTRAVENOUS

## 2017-09-23 MED ORDER — SODIUM CHLORIDE 0.9 % IV SOLN
INTRAVENOUS | Status: DC
  Administered 2017-09-23 (×2): via INTRAVENOUS

## 2017-09-23 MED ORDER — HEPARIN SODIUM (PORCINE) 1000 UNIT/ML IJ SOLN
INTRAMUSCULAR | Status: DC | PRN
  Administered 2017-09-23: 6000 [IU] via INTRAVENOUS
  Administered 2017-09-23: 5000 [IU] via INTRAVENOUS
  Administered 2017-09-23: 8000 [IU] via INTRAVENOUS
  Administered 2017-09-23: 3000 [IU] via INTRAVENOUS

## 2017-09-23 MED ORDER — ONDANSETRON HCL 4 MG/2ML IJ SOLN
INTRAMUSCULAR | Status: DC | PRN
  Administered 2017-09-23: 4 mg via INTRAVENOUS

## 2017-09-23 MED ORDER — BUPIVACAINE HCL (PF) 0.25 % IJ SOLN
INTRAMUSCULAR | Status: AC
  Filled 2017-09-23: qty 30

## 2017-09-23 MED ORDER — MAGNESIUM 250 MG PO TABS: 250.0000 mg | ORAL_TABLET | Freq: Every day | ORAL | Status: DC

## 2017-09-23 MED ORDER — HEPARIN SODIUM (PORCINE) 1000 UNIT/ML IJ SOLN
INTRAMUSCULAR | Status: AC
  Filled 2017-09-23: qty 1

## 2017-09-23 MED ORDER — POTASSIUM CHLORIDE CRYS ER 20 MEQ PO TBCR
20.0000 meq | EXTENDED_RELEASE_TABLET | Freq: Every day | ORAL | Status: DC
  Administered 2017-09-24: 20 meq via ORAL
  Filled 2017-09-23: qty 1

## 2017-09-23 MED ORDER — BUPIVACAINE HCL (PF) 0.25 % IJ SOLN
INTRAMUSCULAR | Status: DC | PRN
  Administered 2017-09-23: 30 mL

## 2017-09-23 MED ORDER — LINAGLIPTIN 5 MG PO TABS
5.0000 mg | ORAL_TABLET | Freq: Every day | ORAL | Status: DC
Start: 2017-09-23 — End: 2017-09-24
  Administered 2017-09-23 – 2017-09-24 (×2): 5 mg via ORAL
  Filled 2017-09-23 (×2): qty 1

## 2017-09-23 MED ORDER — DEXAMETHASONE SODIUM PHOSPHATE 10 MG/ML IJ SOLN
INTRAMUSCULAR | Status: DC | PRN
  Administered 2017-09-23: 5 mg via INTRAVENOUS

## 2017-09-23 MED ORDER — FENTANYL CITRATE (PF) 100 MCG/2ML IJ SOLN
INTRAMUSCULAR | Status: DC | PRN
  Administered 2017-09-23: 25 ug via INTRAVENOUS
  Administered 2017-09-23: 100 ug via INTRAVENOUS

## 2017-09-23 MED ORDER — HEPARIN (PORCINE) IN NACL 2-0.9 UNIT/ML-% IJ SOLN
INTRAMUSCULAR | Status: AC
  Filled 2017-09-23: qty 500

## 2017-09-23 MED ORDER — HYDROCODONE-ACETAMINOPHEN 5-325 MG PO TABS
1.0000 | ORAL_TABLET | ORAL | Status: DC | PRN
  Administered 2017-09-23: 2 via ORAL

## 2017-09-23 MED ORDER — HYDROCODONE-ACETAMINOPHEN 10-325 MG PO TABS
1.0000 | ORAL_TABLET | Freq: Every day | ORAL | Status: DC
Start: 2017-09-23 — End: 2017-09-24
  Filled 2017-09-23: qty 1

## 2017-09-23 MED ORDER — FENTANYL CITRATE (PF) 100 MCG/2ML IJ SOLN: 12.5000 ug | Freq: Once | INTRAMUSCULAR | Status: DC

## 2017-09-23 MED ORDER — LIDOCAINE HCL (CARDIAC) 20 MG/ML IV SOLN
INTRAVENOUS | Status: DC | PRN
  Administered 2017-09-23: 100 mg via INTRAVENOUS

## 2017-09-23 MED ORDER — RIVAROXABAN 20 MG PO TABS
20.0000 mg | ORAL_TABLET | Freq: Every day | ORAL | Status: DC
  Administered 2017-09-24: 20 mg via ORAL
  Filled 2017-09-23: qty 1

## 2017-09-23 MED ORDER — OXYCODONE HCL 5 MG/5ML PO SOLN: 5.0000 mg | Freq: Once | ORAL | Status: DC | PRN

## 2017-09-23 MED ORDER — HYDROCODONE-ACETAMINOPHEN 5-325 MG PO TABS
ORAL_TABLET | ORAL | Status: AC
  Filled 2017-09-23: qty 2

## 2017-09-23 MED ORDER — ROCURONIUM BROMIDE 100 MG/10ML IV SOLN
INTRAVENOUS | Status: DC | PRN
  Administered 2017-09-23 (×2): 10 mg via INTRAVENOUS
  Administered 2017-09-23: 50 mg via INTRAVENOUS
  Administered 2017-09-23: 10 mg via INTRAVENOUS
  Administered 2017-09-23: 20 mg via INTRAVENOUS

## 2017-09-23 SURGICAL SUPPLY — 18 items
BLANKET WARM UNDERBOD FULL ACC (MISCELLANEOUS) ×2 IMPLANT
CATH MAPPNG PENTARAY F 2-6-2MM (CATHETERS) ×1 IMPLANT
CATH NAVISTAR SMARTTOUCH DF (ABLATOR) ×2 IMPLANT
CATH SOUNDSTAR 3D IMAGING (CATHETERS) ×2 IMPLANT
CATH SOUNDSTAR ECO REPROCESSED (CATHETERS) IMPLANT
CATH WEBSTER BI DIR CS D-F CRV (CATHETERS) ×2 IMPLANT
COVER SWIFTLINK CONNECTOR (BAG) ×2 IMPLANT
NEEDLE TRANSEP BRK 71CM 407200 (NEEDLE) ×2 IMPLANT
PACK EP LATEX FREE (CUSTOM PROCEDURE TRAY) ×1
PACK EP LF (CUSTOM PROCEDURE TRAY) ×1 IMPLANT
PAD DEFIB LIFELINK (PAD) ×2 IMPLANT
PATCH CARTO3 (PAD) ×2 IMPLANT
PENTARAY F 2-6-2MM (CATHETERS) ×2
SHEATH AVANTI 11CM 7FR (SHEATH) ×4 IMPLANT
SHEATH AVANTI 11CM 9FR (SHEATH) ×2 IMPLANT
SHEATH AVANTI 11F 11CM (SHEATH) ×2 IMPLANT
SHEATH SWARTZ TS SL2 63CM 8.5F (SHEATH) ×2 IMPLANT
TUBING SMART ABLATE COOLFLOW (TUBING) ×2 IMPLANT

## 2017-09-23 NOTE — Progress Notes (Signed)
Site area: rt groin fv sheaths x3 Site Prior to Removal:  Level 0 Pressure Applied For:  1 hour Manual:   yes Patient Status During Pull:  stable Post Pull Site:  Level  0 Post Pull Instructions Given:  yes Post Pull Pulses Present: palpable Dressing Applied:  gauze Bedrest begins @ 1530 Comments:  IV saline locked

## 2017-09-23 NOTE — Transfer of Care (Signed)
Immediate Anesthesia Transfer of Care Note  Patient: [name]  Procedure(s) Performed: ATRIAL FIBRILLATION ABLATION (N/A )  Patient Location: Cath Lab  Anesthesia Type:General  Level of Consciousness: awake, alert , oriented and patient cooperative  Airway & Oxygen Therapy: Patient Spontanous Breathing and Patient connected to nasal cannula oxygen  Post-op Assessment: Report given to RN and Post -op Vital signs reviewed and stable  Post vital signs: Reviewed and stable  Last Vitals:  Vitals Value Taken Time  BP 96/46 09/23/2017  1:29 PM  Temp    Pulse 74 09/23/2017  1:39 PM  Resp 22 09/23/2017  1:39 PM  SpO2 97 % 09/23/2017  1:39 PM  Vitals shown include unvalidated device data.  Last Pain:  Vitals:   09/23/17 1311  TempSrc:   PainSc: 6       Patients Stated Pain Goal: 4 (09/23/17 0752)  Complications: No apparent anesthesia complications

## 2017-09-23 NOTE — Interval H&P Note (Signed)
History and Physical Interval Note:  09/23/2017 9:40 AM  [name]  has presented today for surgery, with the diagnosis of afib  The various methods of treatment have been discussed with the patient and family. After consideration of risks, benefits and other options for treatment, the patient has consented to  Procedure(s): ATRIAL FIBRILLATION ABLATION (N/A) as a surgical intervention .  The patient's history has been reviewed, patient examined, no change in status, stable for surgery.  I have reviewed the patient's chart and labs.  Questions were answered to the patient's satisfaction.    Cardiac CT reviewed with patient at length He reports compliance with xarelto without interruption.  Hillis RangeJames Jakalyn Kratky

## 2017-09-23 NOTE — Anesthesia Preprocedure Evaluation (Addendum)
Anesthesia Evaluation  Patient identified by MRN, date of birth, ID band Patient awake    Reviewed: Allergy & Precautions, NPO status , Patient's Chart, lab work & pertinent test results  Airway Mallampati: I       Dental no notable dental hx. (+) Teeth Intact   Pulmonary former smoker,    Pulmonary exam normal        Cardiovascular hypertension, Pt. on medications and Pt. on home beta blockers + CAD, + Past MI and +CHF  Normal cardiovascular exam Rhythm:Regular Rate:Normal     Neuro/Psych    GI/Hepatic Neg liver ROS,   Endo/Other  diabetes, Type 2, Oral Hypoglycemic Agents  Renal/GU negative Renal ROS  negative genitourinary   Musculoskeletal negative musculoskeletal ROS (+)   Abdominal (+) + obese,   Peds  Hematology   Anesthesia Other Findings ZAVIER CANELA  CARDIAC CATHETERIZATION  Order# [id number]  Reading physician: Corky Crafts, MD Ordering physician: Corky Crafts, MD Study date: 09/25/16 Physicians   Panel Physicians Referring Physician Case Authorizing Physician Corky Crafts, MD (Primary)   Procedures   Left Heart Cath and Cors/Grafts Angiography Conclusion     Ost LM to LM lesion, 60 %stenosed.  Mid LAD lesion, 60 %stenosed. Patent LIMA to LAD.  Patent SVG to diagonal.  Patent SVG to moderately disease OM. The OM is patent back to the circumflex and provides some flow to the circumflex.  RPDA lesion, 80 %stenosed. Patent SVG to PDA. Early bifurcation of PDA, PLA.  The left ventricular systolic function is normal.  LV end diastolic pressure is mildly elevated.  There is no aortic valve stenosis.    Left main lesion could be intervened upon to improve flow to the circumflex, but this may disrupt flow in the other grafts.  Therefore, this was deferred in favor of medical therapy.  No clear culprit lesion for acute presentation. Negative enzymes at this point as  well.    Continue aggressive medical therapy.    READ BONELLI  2D Echocardiogram without contrast  Order# [id number]  Ordering physician: Thurmon Fair, MD Study date: 04/04/14 Result Notes for 2D Echocardiogram without contrast   Notes Recorded by Vita Barley, CMA on 04/05/2014 at 11:34 AM Echo results discussed with patient. Voiced understanding. ------  Notes Recorded by Thurmon Fair, MD on 04/04/2014 at 6:05 PM Echo looks pretty good. The left atrium is only mildly dilated - he could be a good candidate for ablation.   Study Result   Result status: Edited Result - FINAL         *[facility] at [facility]         [address]            [place], [place] [address]              [phone]  ------------------------------------------------------------------- Transthoracic Echocardiography  (Report amended )  Patient:  Traevon, Meiring MR #:    [id number] Study Date: 04/04/2014 Gender:   M Age:    57 Height:   185.4 cm Weight:   119.3 kg BSA:    2.52 m^2 Pt. Status: Room:  ORDERING   Thurmon Fair, MD REFERRING  Thurmon Fair, MD ATTENDING  Bryan Lemma SONOGRAPHER Clearence Ped, RCS PERFORMING  [facility], Outpatient  cc:  ------------------------------------------------------------------- LV EF: 50% -  55%  ------------------------------------------------------------------- Indications:   427.31 Atrial Fibrillation.  ------------------------------------------------------------------- Study Conclusions  - Left ventricle: The cavity size was normal. Wall thickness was normal. Systolic  function was normal. The estimated ejection fraction was in the range of 50% to 55%. - Mitral valve: There was mild regurgitation. - Left atrium: The atrium was mildly dilated.  Transthoracic echocardiography. M-mode, complete 2D, spectral Doppler, and  color Doppler. Birthdate: Patient birthdate: October 25, 1960. Age: Patient is 57 yr old. Sex: Gender: male. BMI: 34.7 kg/m^2. Blood pressure:   132/86 Patient status: Inpatient. Study date: Study date: 04/04/2014. Study time: 08:23 AM. Location: Echo laboratory.  -------------------------------------------------------------------  ------------------------------------------------------------------- Left ventricle: The cavity size was normal. Wall thickness was normal. Systolic function was normal. The estimated ejection fraction was in the range of 50% to 55%.  ------------------------------------------------------------------- Aortic valve:  Moderately thickened, mildly calcified leaflets. Doppler: There was no significant regurgitation.  ------------------------------------------------------------------- Mitral valve:  Moderately thickened, noncalcified leaflets . Doppler: There was mild regurgitation.  Peak gradient (D): 4 mm Hg.  ------------------------------------------------------------------- Left atrium: LA volume/ BSA = 33.5 ml/m2. The atrium was mildly dilated.  ------------------------------------------------------------------- Right ventricle: The cavity size was normal. Wall thickness was normal. Systolic function was normal.  ------------------------------------------------------------------- Pulmonic valve:  Poorly visualized. Doppler: There was trivial regurgitation.  ------------------------------------------------------------------- Tricuspid valve:  Structurally normal valve.  Leaflet separation was normal. Doppler: Transvalvular velocity was within the normal range. There was mild regurgitation.  ------------------------------------------------------------------- Right atrium: The atrium was normal in size.  ------------------------------------------------------------------- Pericardium: A trivial pericardial effusion was  identified.  ------------------------------------------------------------------- Systemic veins: Inferior vena cava: The vessel was normal in size. The respirophasic diameter changes were in the normal range (= 50%), consistent with normal central venous pressure. Diameter: 19 mm.  ------------------------------------------------------------------- Measurements  IVC                  Value    Reference ID                   19  mm   ---------  Left ventricle             Value    Reference LV ID, ED, PLAX chordal        47.2 mm   43 - 52 LV ID, ES, PLAX chordal        36.3 mm   23 - 38 LV fx shortening, PLAX chordal (L)   23  %   >=29 LV PW thickness, ED          13.5 mm   --------- IVS/LV PW ratio, ED          0.65     <=1.3 Stroke volume, 2D           64  ml   --------- Stroke volume/bsa, 2D         25  ml/m^2 --------- LV e&', lateral             10.9 cm/s  --------- LV E/e&', lateral            9.17     --------- LV e&', medial             13.3 cm/s  --------- LV E/e&', medial            7.52     --------- LV e&', average             12.1 cm/s  --------- LV E/e&', average            8.26     ---------  Ventricular septum           Value    Reference IVS  thickness, ED           8.77 mm   ---------  LVOT                  Value    Reference LVOT ID, S               22  mm   --------- LVOT area               3.8  cm^2  --------- LVOT peak velocity, S         82  cm/s  --------- LVOT mean velocity, S         59.5 cm/s  --------- LVOT VTI, S              16.9 cm   ---------  Aorta                  Value    Reference Aortic root ID, ED           39  mm   ---------  Left atrium              Value    Reference LA ID, A-P, ES             56  mm   --------- LA ID/bsa, A-P         (H)   2.22 cm/m^2 <=2.2 LA volume, ES, 1-p A4C         76  ml   --------- LA volume/bsa, ES, 1-p A4C       30.2 ml/m^2 --------- LA volume, ES, 1-p A2C         85  ml   --------- LA volume/bsa, ES, 1-p A2C       33.8 ml/m^2 ---------  Mitral valve              Value    Reference Mitral E-wave peak velocity      100  cm/s  --------- Mitral deceleration time        176  ms   150 - 230 Mitral peak gradient, D        4   mm Hg ---------  Pulmonary arteries           Value    Reference PA pressure, S, DP           19  mm Hg <=30  Tricuspid valve            Value    Reference Tricuspid regurg peak velocity     198  cm/s  --------- Tricuspid peak RV-RA gradient     16  mm Hg ---------  Systemic veins             Value    Reference Estimated CVP             3   mm Hg ---------  Right ventricle            Value    Reference RV pressure, S, DP           19  mm Hg <=30 RV s&', lateral, S           10.3 cm/s  ---------  Legend: (L) and (H) mark values outside specified reference range.  ------------------------------------------------------------------- Constance Holster, M.D. 2015-10-05T15:41:23 Patient Information   Patient Name Enos, Muhl Sex Male DOB 25-May-1961 SSN [id number] Reason for Exam  Priority: Routine  Dx: Atrial  fibrillation, unspecified (I48.91 (ICD-10-CM)) Surgical History   Surgical History    Procedure Laterality Date Comment Source CARDIAC  CATHETERIZATION  2008  Provider CORONARY ANGIOPLASTY  1996  Provider CORONARY ARTERY BYPASS GRAFT  2008 LIMA-LAD, SVG-Dx, SVG-OM1, SVG-PDA Provider  Other Surgical History    Procedure Laterality Date Comment Source LEFT HEART CATH AND CORS/GRAFTS ANGIOGRAPHY N/A 09/25/2016 Procedure: Left Heart Cath and Cors/Grafts Angiography; Surgeon: Corky Crafts, MD; Location: [facility] INVASIVE CV LAB; Service: Cardiovascular; Laterality: N/A; Provider  Performing Technologist/Nurse   Performing Technologist/Nurse:  Implants    No active implants to display in this view. Order-Level Documents:   There are no order-level documents.  Encounter-Level Documents - 04/04/2014:   Scan on 04/16/2014 9:47 AM by Default, Provider, MDScan on 04/16/2014 9:47 AM by Default, Provider, MD  Electronic signature on 04/04/2014 8:06 AM: chmg[facility]/nl/tst - Signed  Electronic signature on 04/04/2014 8:05 AM: chmgh/nl/tst - Signed    Printable Result Report    Result Report      Reproductive/Obstetrics                            Anesthesia Physical Anesthesia Plan  ASA: III  Anesthesia Plan: General   Post-op Pain Management:    Induction: Intravenous  PONV Risk Score and Plan: 2 and Ondansetron and Midazolam  Airway Management Planned: Oral ETT  Additional Equipment:   Intra-op Plan:   Post-operative Plan: Extubation in OR  Informed Consent: I have reviewed the patients History and Physical, chart, labs and discussed the procedure including the risks, benefits and alternatives for the proposed anesthesia with the patient or authorized representative who has indicated his/her understanding and acceptance.   Dental advisory given  Plan Discussed with: CRNA and Surgeon  Anesthesia Plan Comments:         Anesthesia Quick Evaluation

## 2017-09-23 NOTE — Anesthesia Procedure Notes (Signed)
Procedure Name: Intubation Date/Time: 09/23/2017 9:54 AM Performed by: Shirlyn Goltz, CRNA Pre-anesthesia Checklist: Patient identified, Emergency Drugs available, Suction available and Patient being monitored Patient Re-evaluated:Patient Re-evaluated prior to induction Oxygen Delivery Method: Circle system utilized Preoxygenation: Pre-oxygenation with 100% oxygen Induction Type: IV induction Ventilation: Mask ventilation without difficulty Laryngoscope Size: Mac and 3 Grade View: Grade I Tube type: Oral Tube size: 7.5 mm Number of attempts: 1 Airway Equipment and Method: Stylet Placement Confirmation: ETT inserted through vocal cords under direct vision,  positive ETCO2 and breath sounds checked- equal and bilateral Secured at: 22 cm Tube secured with: Tape Dental Injury: Teeth and Oropharynx as per pre-operative assessment

## 2017-09-23 NOTE — Discharge Instructions (Signed)
Post procedure care instructions No driving for 4 days. No lifting over 5 lbs for 1 week. No vigorous or sexual activity for 1 week. You may return to work on 09/30/17. Keep procedure site clean & dry. If you notice increased pain, swelling, bleeding or pus, call/return!  You may shower, but no soaking baths/hot tubs/pools for 1 week.     You have an appointment set up with the Atrial Fibrillation Clinic.  Multiple studies have shown that being followed by a dedicated atrial fibrillation clinic in addition to the standard care you receive from your other physicians improves health. We believe that enrollment in the atrial fibrillation clinic will allow us to better care for you.   The phone number to the Atrial Fibrillation Clinic is [phone]. The clinic is staffed Monday through Friday from 8:30am to 5pm.  Parking Directions: The clinic is located in the Heart and Vascular Building connected to [facility]. 1)From [address] turn on to [address] and go to the 3rd entrance  (Heart and Vascular entrance) on the right. 2)Look to the right for Heart &Vascular Parking Garage. 3)A code for the entrance is required please call the clinic to receive this.   4)Take the elevators to the 1st floor. Registration is in the room with the glass walls at the end of the hallway.  If you have any trouble parking or locating the clinic, please dont hesitate to call [phone].

## 2017-09-23 NOTE — Anesthesia Postprocedure Evaluation (Signed)
Anesthesia Post Note  Patient: [name]  Procedure(s) Performed: ATRIAL FIBRILLATION ABLATION (N/A )     Patient location during evaluation: Cath Lab Anesthesia Type: General Level of consciousness: awake Pain management: pain level controlled Vital Signs Assessment: post-procedure vital signs reviewed and stable Respiratory status: spontaneous breathing Cardiovascular status: stable Postop Assessment: no apparent nausea or vomiting Anesthetic complications: no    Last Vitals:  Vitals:   09/23/17 1530 09/23/17 1549  BP: 101/73   Pulse: 77   Resp: 12   Temp:  36.5 C  SpO2: 97%     Last Pain:  Vitals:   09/23/17 1549  TempSrc: Temporal  PainSc:    Pain Goal: Patients Stated Pain Goal: 4 (09/23/17 0752)               [name],[name]

## 2017-09-24 ENCOUNTER — Encounter (HOSPITAL_COMMUNITY): Payer: Self-pay | Admitting: Internal Medicine

## 2017-09-24 DIAGNOSIS — I5032 Chronic diastolic (congestive) heart failure: Secondary | ICD-10-CM | POA: Diagnosis not present

## 2017-09-24 DIAGNOSIS — I2510 Atherosclerotic heart disease of native coronary artery without angina pectoris: Secondary | ICD-10-CM | POA: Diagnosis not present

## 2017-09-24 DIAGNOSIS — I110 Hypertensive heart disease with heart failure: Secondary | ICD-10-CM | POA: Diagnosis not present

## 2017-09-24 DIAGNOSIS — I481 Persistent atrial fibrillation: Secondary | ICD-10-CM

## 2017-09-24 LAB — POCT ACTIVATED CLOTTING TIME
ACTIVATED CLOTTING TIME: 268 s
Activated Clotting Time: 230 seconds
Activated Clotting Time: 241 seconds
Activated Clotting Time: 312 seconds

## 2017-09-24 MED ORDER — PANTOPRAZOLE SODIUM 40 MG PO TBEC: 40.0000 mg | DELAYED_RELEASE_TABLET | Freq: Every day | ORAL | 1 refills | Status: DC

## 2017-09-24 NOTE — Plan of Care (Signed)
  Problem: Nutrition: Goal: Adequate nutrition will be maintained Outcome: Completed/Met  Pt consumes 100% of meals and snacks

## 2017-09-24 NOTE — Discharge Summary (Addendum)
ELECTROPHYSIOLOGY PROCEDURE DISCHARGE SUMMARY    Patient ID: [name],  MRN: [id number], DOB/AGE: 05-May-1961 57 y.o.  Admit date: 09/23/2017 Discharge date: 09/24/2017  Primary Care Physician: Gareth Morgan, MD Primary Cardiologist: [name] Electrophysiologist: [name] Range, MD  Primary Discharge Diagnosis:  Persistent atrial fibrillation status post ablation this admission  Secondary Discharge Diagnosis:  1.  HTN 2.  CAD 3.  Obesity 4.  Chronic diastolic heart failure 5.  Diabetes  Procedures This Admission:  1.  Electrophysiology study and radiofrequency catheter ablation on 09/23/17 by Dr [name].  This study demonstrated atrial fibrillation upon presentation; intracardiac echo reveals a moderate sized left atrium with four separate pulmonary veins without evidence of pulmonary vein stenosis.  There was a short common ostium to the left PVs; successful electrical isolation and anatomical encircling of all four pulmonary veins with radiofrequency current.  A WACA approach was used; additional left atrial ablation was performed with a standard box lesion created along the posterior wall of the left atrium; atrial fibrillation successfully cardioverted to sinus rhythm; no early apparent complications..    Brief HPI: [name] is a 57 y.o. male with a history of persistent atrial fibrillation.  They have failed medical therapy with Tikosyn. Risks, benefits, and alternatives to catheter ablation of atrial fibrillation were reviewed with the patient who wished to proceed.  The patient underwent cardiac CT prior to the procedure which demonstrated  no LAA thrombus.    Hospital Course:  The patient was admitted and underwent EPS/RFCA of atrial fibrillation with details as outlined above.  They were monitored on telemetry overnight which demonstrated SR.  Groin was without complication on the day of discharge.  The patient was examined and considered to be stable for  discharge.  Wound care and restrictions were reviewed with the patient.  The patient will be seen back by [name] Coco, NP in 4 weeks and Dr [name] in 12 weeks for post ablation follow up.   This patients CHA2DS2-VASc Score and unadjusted Ischemic Stroke Rate (% per year) is equal to 3.2 % stroke rate/year from a score of 3 Above score calculated as 1 point each if present [CHF, HTN, DM, Vascular=MI/PAD/Aortic Plaque, Age if 65-74, or Male] Above score calculated as 2 points each if present [Age > 75, or Stroke/TIA/TE]   Physical Exam: Vitals:   09/23/17 1723 09/23/17 1753 09/23/17 2023 09/24/17 0500  BP: 113/70 117/77 123/78 116/73  Pulse: 76 81 80 78  Resp: 19 (!) 22 18 18   Temp:   98.3 F (36.8 C) 98.4 F (36.9 C)  TempSrc:   Oral Oral  SpO2: 97% 99% 98% 98%  Weight:    249 lb 9 oz (113.2 kg)  Height:        GEN- The patient is obese appearing, alert and oriented x 3 today.   HEENT: normocephalic, atraumatic; sclera clear, conjunctiva pink; hearing intact; oropharynx clear; neck supple  Lungs- Clear to ausculation bilaterally, normal work of breathing.  No wheezes, rales, rhonchi Heart- Regular rate and rhythm  GI- soft, non-tender, non-distended, bowel sounds present  Extremities- no clubbing, cyanosis, or edema; DP/PT/radial pulses 2+ bilaterally, groin without hematoma/bruit MS- no significant deformity or atrophy Skin- warm and dry, no rash or lesion Psych- euthymic mood, full affect Neuro- strength and sensation are intact   Labs:   Lab Results  Component Value Date   WBC 12.3 (H) 09/18/2017   HGB 12.4 (L) 09/18/2017   HCT 37.3 (L) 09/18/2017  MCV 86 09/18/2017   PLT 233 09/18/2017    Recent Labs  Lab 09/18/17 0952 09/19/17 0922  NA 137  --   K 4.5  --   CL 99  --   CO2 23  --   BUN 21  --   CREATININE 1.07 1.00  CALCIUM 9.3  --   GLUCOSE 246*  --      Discharge Medications:  Allergies as of 09/24/2017   No Known Allergies     Medication List     TAKE these medications   acetaminophen 325 MG tablet Commonly known as:  TYLENOL Take 2 tablets (650 mg total) by mouth every 4 (four) hours as needed for headache or mild pain.   atorvastatin 40 MG tablet Commonly known as:  LIPITOR Take 40 mg by mouth at bedtime.   ferrous sulfate 325 (65 FE) MG tablet Take 325 mg by mouth daily.   furosemide 40 MG tablet Commonly known as:  LASIX Take 40-80 mg by mouth daily as needed for fluid.   glimepiride 2 MG tablet Commonly known as:  AMARYL Take 2 mg by mouth daily.   HYDROcodone-acetaminophen 10-325 MG tablet Commonly known as:  NORCO Take 1 tablet by mouth at bedtime.   isosorbide mononitrate 30 MG 24 hr tablet Commonly known as:  IMDUR TAKE ONE (1) TABLET BY MOUTH EVERY DAY What changed:  See the new instructions.   JANUVIA 100 MG tablet Generic drug:  sitaGLIPtin Take 100 mg by mouth daily.   losartan 25 MG tablet Commonly known as:  COZAAR Take 1 tablet (25 mg total) by mouth daily. What changed:  when to take this   Magnesium 250 MG Tabs Take 250 mg by mouth at bedtime.   metFORMIN 1000 MG tablet Commonly known as:  GLUCOPHAGE Take 1 tablet (1,000 mg total) by mouth 2 (two) times daily with a meal.   multivitamin with minerals tablet Take 1 tablet by mouth 2 (two) times daily.   nebivolol 10 MG tablet Commonly known as:  BYSTOLIC Take 1 tablet (10 mg total) by mouth every morning.   nitroGLYCERIN 0.4 MG SL tablet Commonly known as:  NITROSTAT Place 1 tablet (0.4 mg total) under the tongue every 5 (five) minutes as needed for chest pain.   pantoprazole 40 MG tablet Commonly known as:  PROTONIX Take 1 tablet (40 mg total) by mouth daily.   potassium chloride SA 20 MEQ tablet Commonly known as:  K-DUR,KLOR-CON Take 1 tablet (20 mEq total) by mouth daily.   TIKOSYN 500 MCG capsule Generic drug:  dofetilide Take 1 capsule (500 mcg total) by mouth 2 (two) times daily. Consult MD before taking any new  medications. Report side effects to MD.   XARELTO 20 MG Tabs tablet Generic drug:  rivaroxaban Take 20 mg by mouth daily.       Disposition:   Follow-up Information    [facility] Follow up on 10/27/2017.   Specialty:  Cardiology Why:  9:30AM Contact information: [address] [id number] [facility] [address] [place] [address] [phone]       [name] Range, MD Follow up on 12/22/2017.   Specialty:  Cardiology Why:  11:45AM Contact information: [address] [place] [address] [phone]           Duration of Discharge Encounter: Greater than 30 minutes including physician time.  Signed, Gypsy Balsam, NP 09/24/2017 8:12 AM   I have seen, examined the patient, and reviewed the  above assessment and plan.  Changes to above are made where necessary.  Doing well.   On exam, RRR.  Routine discharge and follow-up.  Co Sign: [name] RangeJames Emberley Kral, MD 09/25/2017 8:06 AM

## 2017-09-26 ENCOUNTER — Ambulatory Visit (HOSPITAL_COMMUNITY): Payer: Federal, State, Local not specified - PPO | Admitting: Nurse Practitioner

## 2017-10-27 ENCOUNTER — Encounter (HOSPITAL_COMMUNITY): Payer: Self-pay | Admitting: Nurse Practitioner

## 2017-10-27 ENCOUNTER — Ambulatory Visit (HOSPITAL_COMMUNITY)
Admission: RE | Admit: 2017-10-27 | Discharge: 2017-10-27 | Disposition: A | Discharge status: Home / Self Care | Payer: Federal, State, Local not specified - PPO | Source: Ambulatory Visit | Attending: Nurse Practitioner | Admitting: Nurse Practitioner

## 2017-10-27 VITALS — BP 136/80 | HR 82 | Ht 72.0 in | Wt 247.0 lb

## 2017-10-27 DIAGNOSIS — E669 Obesity, unspecified: Secondary | ICD-10-CM | POA: Insufficient documentation

## 2017-10-27 DIAGNOSIS — Z951 Presence of aortocoronary bypass graft: Secondary | ICD-10-CM | POA: Diagnosis not present

## 2017-10-27 DIAGNOSIS — E119 Type 2 diabetes mellitus without complications: Secondary | ICD-10-CM | POA: Insufficient documentation

## 2017-10-27 DIAGNOSIS — I4819 Other persistent atrial fibrillation: Secondary | ICD-10-CM

## 2017-10-27 DIAGNOSIS — Z79899 Other long term (current) drug therapy: Secondary | ICD-10-CM | POA: Diagnosis not present

## 2017-10-27 DIAGNOSIS — Z7901 Long term (current) use of anticoagulants: Secondary | ICD-10-CM | POA: Insufficient documentation

## 2017-10-27 DIAGNOSIS — Z7984 Long term (current) use of oral hypoglycemic drugs: Secondary | ICD-10-CM | POA: Insufficient documentation

## 2017-10-27 DIAGNOSIS — I4891 Unspecified atrial fibrillation: Secondary | ICD-10-CM | POA: Insufficient documentation

## 2017-10-27 DIAGNOSIS — I2510 Atherosclerotic heart disease of native coronary artery without angina pectoris: Secondary | ICD-10-CM | POA: Insufficient documentation

## 2017-10-27 DIAGNOSIS — I110 Hypertensive heart disease with heart failure: Secondary | ICD-10-CM | POA: Diagnosis not present

## 2017-10-27 DIAGNOSIS — I252 Old myocardial infarction: Secondary | ICD-10-CM | POA: Insufficient documentation

## 2017-10-27 DIAGNOSIS — K219 Gastro-esophageal reflux disease without esophagitis: Secondary | ICD-10-CM | POA: Insufficient documentation

## 2017-10-27 DIAGNOSIS — E7800 Pure hypercholesterolemia, unspecified: Secondary | ICD-10-CM | POA: Insufficient documentation

## 2017-10-27 DIAGNOSIS — J90 Pleural effusion, not elsewhere classified: Secondary | ICD-10-CM | POA: Insufficient documentation

## 2017-10-27 DIAGNOSIS — I481 Persistent atrial fibrillation: Secondary | ICD-10-CM

## 2017-10-27 DIAGNOSIS — Z87891 Personal history of nicotine dependence: Secondary | ICD-10-CM | POA: Diagnosis not present

## 2017-10-27 DIAGNOSIS — I5032 Chronic diastolic (congestive) heart failure: Secondary | ICD-10-CM | POA: Insufficient documentation

## 2017-10-27 LAB — MAGNESIUM: Magnesium: 1.8 mg/dL (ref 1.7–2.4)

## 2017-10-27 NOTE — Progress Notes (Signed)
Primary Care Physician: Gareth Morgan, MD Referring Physician: Dr. Oletta Darter is a 57 y.o. male with a h/o persisitent afib that is in the afib clinic for one month f/u of ablation. He had a little afib early on after the ablation, but recently had not noted any. No swallowing or groin issues.  He is on long term tikosyn since 2015.  Today, he denies symptoms of palpitations, chest pain, shortness of breath, orthopnea, PND, lower extremity edema, dizziness, presyncope, syncope, or neurologic sequela. The patient is tolerating medications without difficulties and is otherwise without complaint today.   Past Medical History:  Diagnosis Date  . Abdominal distention   . Anemia    Postoperative  . Chronic diastolic CHF (congestive heart failure) (HCC)   . Coronary artery disease    a. premature - acute MI age 73 s/p BMS to prox LAD. b. s/p DES to RCA, PLA, mLAD in 2007. c. 4V CABG in 2008 with LIMA to LAD, SVG to first diagonal, SVG to first OM, SVG to PDA  . Diabetes (HCC)   . Edema   . H/O gastroesophageal reflux (GERD)   . High cholesterol   . Hypertension   . Myocardial infarction (HCC)   . Obesity   . Persistent atrial fibrillation (HCC)    a. on Tikosyn, Xarelto.  . Pleural effusion    resolved with therapy   Past Surgical History:  Procedure Laterality Date  . ABLATION OF DYSRHYTHMIC FOCUS  09/23/2017  . ATRIAL FIBRILLATION ABLATION N/A 09/23/2017   Procedure: ATRIAL FIBRILLATION ABLATION;  Surgeon: Hillis Range, MD;  Location: [facility];  Service: Cardiovascular;  Laterality: N/A;  . CARDIAC CATHETERIZATION  2008  . CORONARY ANGIOPLASTY  1996  . CORONARY ARTERY BYPASS GRAFT  2008   LIMA-LAD, SVG-Dx, SVG-OM1, SVG-PDA  . LEFT HEART CATH AND CORS/GRAFTS ANGIOGRAPHY N/A 09/25/2016   Procedure: Left Heart Cath and Cors/Grafts Angiography;  Surgeon: Corky Crafts, MD;  Location: [facility] INVASIVE CV LAB;  Service: Cardiovascular;  Laterality: N/A;     Current Outpatient Medications  Medication Sig Dispense Refill  . acetaminophen (TYLENOL) 325 MG tablet Take 2 tablets (650 mg total) by mouth every 4 (four) hours as needed for headache or mild pain.    Marland Kitchen atorvastatin (LIPITOR) 40 MG tablet Take 40 mg by mouth at bedtime.     . ferrous sulfate 325 (65 FE) MG tablet Take 325 mg by mouth daily.    . furosemide (LASIX) 40 MG tablet Take 40-80 mg by mouth daily as needed for fluid.     Marland Kitchen glimepiride (AMARYL) 4 MG tablet Take 4 mg by mouth daily with breakfast.    . HYDROcodone-acetaminophen (NORCO) 10-325 MG tablet Take 1 tablet by mouth at bedtime.     . isosorbide mononitrate (IMDUR) 30 MG 24 hr tablet TAKE ONE (1) TABLET BY MOUTH EVERY DAY (Patient taking differently: TAKE 30 MG BY MOUTH EVERY DAY) 30 tablet 8  . JANUVIA 100 MG tablet Take 100 mg by mouth daily.     Marland Kitchen losartan (COZAAR) 25 MG tablet Take 1 tablet (25 mg total) by mouth daily. (Patient taking differently: Take 25 mg by mouth at bedtime. ) 90 tablet 4  . Magnesium 250 MG TABS Take 250 mg by mouth at bedtime.     . metFORMIN (GLUCOPHAGE) 1000 MG tablet Take 1 tablet (1,000 mg total) by mouth 2 (two) times daily with a meal.    . Multiple Vitamins-Minerals (MULTIVITAMIN  WITH MINERALS) tablet Take 1 tablet by mouth 2 (two) times daily.     . nebivolol (BYSTOLIC) 10 MG tablet Take 1 tablet (10 mg total) by mouth every morning.    . nitroGLYCERIN (NITROSTAT) 0.4 MG SL tablet Place 1 tablet (0.4 mg total) under the tongue every 5 (five) minutes as needed for chest pain. 25 tablet 4  . pantoprazole (PROTONIX) 40 MG tablet Take 1 tablet (40 mg total) by mouth daily. 30 tablet 1  . potassium chloride SA (K-DUR,KLOR-CON) 20 MEQ tablet Take 1 tablet (20 mEq total) by mouth daily. 90 tablet 3  . TIKOSYN 500 MCG capsule Take 1 capsule (500 mcg total) by mouth 2 (two) times daily. Consult MD before taking any new medications. Report side effects to MD. 180 capsule 3  . XARELTO 20 MG TABS  tablet Take 20 mg by mouth daily.      No current facility-administered medications for this encounter.     No Known Allergies  Social History   Socioeconomic History  . Marital status: Married    Spouse name: Not on file  . Number of children: Not on file  . Years of education: Not on file  . Highest education level: Not on file  Occupational History  . Occupation: Engineer, petroleum: [employer]  . Occupation: Has cattle and chickens.  Social Needs  . Financial resource strain: Not on file  . Food insecurity:    Worry: Not on file    Inability: Not on file  . Transportation needs:    Medical: Not on file    Non-medical: Not on file  Tobacco Use  . Smoking status: Former Smoker    Packs/day: 2.00    Years: 30.00    Pack years: 60.00    Types: Cigarettes  . Smokeless tobacco: Never Used  Substance and Sexual Activity  . Alcohol use: Yes    Alcohol/week: 0.6 oz    Types: 1 Standard drinks or equivalent per week    Comment: rare  . Drug use: No  . Sexual activity: Not on file  Lifestyle  . Physical activity:    Days per week: Not on file    Minutes per session: Not on file  . Stress: Not on file  Relationships  . Social connections:    Talks on phone: Not on file    Gets together: Not on file    Attends religious service: Not on file    Active member of club or organization: Not on file    Attends meetings of clubs or organizations: Not on file    Relationship status: Not on file  . Intimate partner violence:    Fear of current or ex partner: Not on file    Emotionally abused: Not on file    Physically abused: Not on file    Forced sexual activity: Not on file  Other Topics Concern  . Not on file  Social History Narrative  . Not on file    Family History  Problem Relation Age of Onset  . CAD Father   . Vascular Disease Father        carotid artery disease    ROS- All systems are reviewed and negative except as per the HPI  above  Physical Exam: Vitals:   10/27/17 0931  BP: 136/80  Pulse: 82  Weight: 247 lb (112 kg)  Height: 6' (1.829 m)   Wt Readings from Last 3 Encounters:  10/27/17 247 lb (  112 kg)  09/24/17 249 lb 9 oz (113.2 kg)  08/25/17 256 lb (116.1 kg)    Labs: Lab Results  Component Value Date   NA 137 09/18/2017   K 4.5 09/18/2017   CL 99 09/18/2017   CO2 23 09/18/2017   GLUCOSE 246 (H) 09/18/2017   BUN 21 09/18/2017   CREATININE 1.00 09/19/2017   CALCIUM 9.3 09/18/2017   MG 1.9 07/30/2017   Lab Results  Component Value Date   INR 1.4 04/21/2007   Lab Results  Component Value Date   CHOL 91 09/24/2016   HDL 23 (L) 09/24/2016   LDLCALC 49 09/24/2016   TRIG 97 09/24/2016     GEN- The patient is well appearing, alert and oriented x 3 today.   Head- normocephalic, atraumatic Eyes-  Sclera clear, conjunctiva pink Ears- hearing intact Oropharynx- clear Neck- supple, no JVP Lymph- no cervical lymphadenopathy Lungs- Clear to ausculation bilaterally, normal work of breathing Heart- regular rate and rhythm, no murmurs, rubs or gallops, PMI not laterally displaced GI- soft, NT, ND, + BS Extremities- no clubbing, cyanosis, or edema MS- no significant deformity or atrophy Skin- no rash or lesion Psych- euthymic mood, full affect Neuro- strength and sensation are intact  EKG-NSR  at  82 bpm, pr int 168 ms, qrs int 84 ms, qtc 434 ms Epic records reviewed    Assessment and Plan: 1. Afib S/p ablation since 3/26 In SR Continue Bystolic to 10 mg daily for better rate control Continue Tikosyn at 500 mcg bid Mag drawn today but K+ in range in March for ablation Continue xarelto 20 mg daily, reminded not to miss doses after ablation  2. HTN Stable   F/u with Dr. Johney Frame 6/24   Elvina Sidle. [facility] [address] [place], [place] [address] [phone]

## 2017-11-06 NOTE — Addendum Note (Signed)
Encounter addended by: Newman Nip, NP on: 11/06/2017 4:55 PM  Actions taken: LOS modified

## 2017-11-20 ENCOUNTER — Other Ambulatory Visit: Payer: Self-pay

## 2017-11-20 MED ORDER — PANTOPRAZOLE SODIUM 40 MG PO TBEC: 40.0000 mg | DELAYED_RELEASE_TABLET | Freq: Every day | ORAL | 7 refills | Status: DC

## 2017-12-19 ENCOUNTER — Telehealth: Payer: Self-pay | Admitting: Internal Medicine

## 2017-12-19 MED ORDER — DOFETILIDE 500 MCG PO CAPS: 500.0000 ug | ORAL_CAPSULE | Freq: Two times a day (BID) | ORAL | 3 refills | Status: DC

## 2017-12-19 NOTE — Telephone Encounter (Signed)
[facility] is looking to transition some of their mutual patient from brand name medications to FDA-approved generic formulations. Would Dr. Johney FrameAllred like to change pt's medication to a generic for pt to be able to get medication cheaper? This medication states that it is medically necessary. Tikosyn 500 mcg capsule, BID. Pharmacy phone # Valentina GuLucy [phone]. Pharmacy fax # [phone]. Please address

## 2017-12-19 NOTE — Telephone Encounter (Signed)
Changed Pt from Brand Tikosyn to dofetilide d/t cost for Pt. Returned call to [place] with [facility] to advise. No further action needed

## 2017-12-22 ENCOUNTER — Encounter: Payer: Self-pay | Admitting: Internal Medicine

## 2017-12-22 ENCOUNTER — Ambulatory Visit: Payer: Federal, State, Local not specified - PPO | Admitting: Internal Medicine

## 2017-12-22 ENCOUNTER — Encounter (INDEPENDENT_AMBULATORY_CARE_PROVIDER_SITE_OTHER): Payer: Self-pay

## 2017-12-22 VITALS — BP 124/62 | HR 80 | Ht 72.0 in | Wt 241.0 lb

## 2017-12-22 DIAGNOSIS — I4819 Other persistent atrial fibrillation: Secondary | ICD-10-CM

## 2017-12-22 DIAGNOSIS — I481 Persistent atrial fibrillation: Secondary | ICD-10-CM | POA: Diagnosis not present

## 2017-12-22 DIAGNOSIS — I5032 Chronic diastolic (congestive) heart failure: Secondary | ICD-10-CM | POA: Diagnosis not present

## 2017-12-22 DIAGNOSIS — I10 Essential (primary) hypertension: Secondary | ICD-10-CM

## 2017-12-22 NOTE — Progress Notes (Signed)
PCP: Gareth MorganKnowlton, Steve, MD Primary Cardiologist: Dr Royann Shiversroitoru Primary EP: Dr Johney FrameAllred  [name] is a 57 y.o. male who presents today for routine electrophysiology followup.  Since his recent ablation, the patient reports doing very well.  Denies procedure related complications.  Very pleased with results.  Today, he denies symptoms of palpitations, chest pain, shortness of breath,  lower extremity edema, dizziness, presyncope, or syncope.  The patient is otherwise without complaint today.   Past Medical History:  Diagnosis Date  . Abdominal distention   . Anemia    Postoperative  . Chronic diastolic CHF (congestive heart failure) (HCC)   . Coronary artery disease    a. premature - acute MI age 57 s/p BMS to prox LAD. b. s/p DES to RCA, PLA, mLAD in 2007. c. 4V CABG in 2008 with LIMA to LAD, SVG to first diagonal, SVG to first OM, SVG to PDA  . Diabetes (HCC)   . Edema   . H/O gastroesophageal reflux (GERD)   . High cholesterol   . Hypertension   . Myocardial infarction (HCC)   . Obesity   . Persistent atrial fibrillation (HCC)    a. on Tikosyn, Xarelto.  . Pleural effusion    resolved with therapy   Past Surgical History:  Procedure Laterality Date  . ABLATION OF DYSRHYTHMIC FOCUS  09/23/2017  . ATRIAL FIBRILLATION ABLATION N/A 09/23/2017   Procedure: ATRIAL FIBRILLATION ABLATION;  Surgeon: Hillis RangeAllred, Kirstine Jacquin, MD;  Location: [facility];  Service: Cardiovascular;  Laterality: N/A;  . CARDIAC CATHETERIZATION  2008  . CORONARY ANGIOPLASTY  1996  . CORONARY ARTERY BYPASS GRAFT  2008   LIMA-LAD, SVG-Dx, SVG-OM1, SVG-PDA  . LEFT HEART CATH AND CORS/GRAFTS ANGIOGRAPHY N/A 09/25/2016   Procedure: Left Heart Cath and Cors/Grafts Angiography;  Surgeon: Corky CraftsJayadeep S Varanasi, MD;  [facility];  Service: Cardiovascular;  Laterality: N/A;    ROS- all systems are reviewed and negatives except as per HPI above  Current Outpatient Medications  Medication Sig Dispense  Refill  . acetaminophen (TYLENOL) 325 MG tablet Take 2 tablets (650 mg total) by mouth every 4 (four) hours as needed for headache or mild pain.    Marland Kitchen. atorvastatin (LIPITOR) 40 MG tablet Take 40 mg by mouth at bedtime.     . dofetilide (TIKOSYN) 500 MCG capsule Take 1 capsule (500 mcg total) by mouth 2 (two) times daily. Consult MD before taking any new medications. Report side effects to MD. 180 capsule 3  . ferrous sulfate 325 (65 FE) MG tablet Take 325 mg by mouth daily.    . furosemide (LASIX) 40 MG tablet Take 40-80 mg by mouth daily as needed for fluid.     Marland Kitchen. glimepiride (AMARYL) 4 MG tablet Take 4 mg by mouth daily with breakfast.    . HYDROcodone-acetaminophen (NORCO) 10-325 MG tablet Take 1 tablet by mouth at bedtime.     . isosorbide mononitrate (IMDUR) 30 MG 24 hr tablet TAKE ONE (1) TABLET BY MOUTH EVERY DAY (Patient taking differently: TAKE 30 MG BY MOUTH EVERY DAY) 30 tablet 8  . JANUVIA 100 MG tablet Take 100 mg by mouth daily.     Marland Kitchen. losartan (COZAAR) 25 MG tablet Take 1 tablet (25 mg total) by mouth daily. (Patient taking differently: Take 25 mg by mouth at bedtime. ) 90 tablet 4  . Magnesium 250 MG TABS Take 250 mg by mouth at bedtime.     . metFORMIN (GLUCOPHAGE) 1000 MG tablet Take  1 tablet (1,000 mg total) by mouth 2 (two) times daily with a meal.    . Multiple Vitamins-Minerals (MULTIVITAMIN WITH MINERALS) tablet Take 1 tablet by mouth 2 (two) times daily.     . nebivolol (BYSTOLIC) 10 MG tablet Take 1 tablet (10 mg total) by mouth every morning.    . nitroGLYCERIN (NITROSTAT) 0.4 MG SL tablet Place 1 tablet (0.4 mg total) under the tongue every 5 (five) minutes as needed for chest pain. 25 tablet 4  . pantoprazole (PROTONIX) 40 MG tablet Take 1 tablet (40 mg total) by mouth daily. 30 tablet 7  . potassium chloride SA (K-DUR,KLOR-CON) 20 MEQ tablet Take 1 tablet (20 mEq total) by mouth daily. 90 tablet 3  . XARELTO 20 MG TABS tablet Take 20 mg by mouth daily.      No current  facility-administered medications for this visit.     Physical Exam: Vitals:   12/22/17 1132  BP: 124/62  Pulse: 80  Weight: 241 lb (109.3 kg)  Height: 6' (1.829 m)    GEN- The patient is well appearing, alert and oriented x 3 today.   Head- normocephalic, atraumatic Eyes-  Sclera clear, conjunctiva pink Ears- hearing intact Oropharynx- clear Lungs- Clear to ausculation bilaterally, normal work of breathing Heart- Regular rate and rhythm, no murmurs, rubs or gallops, PMI not laterally displaced GI- soft, NT, ND, + BS Extremities- no clubbing, cyanosis, or edema  Wt Readings from Last 3 Encounters:  12/22/17 241 lb (109.3 kg)  10/27/17 247 lb (112 kg)  09/24/17 249 lb 9 oz (113.2 kg)    EKG tracing ordered today is personally reviewed and shows sinus rhythm 80 bpm, PR 144 msec, QRS 86 msec, Qtc 449 msec  Assessment and Plan:  1. Persistent atrial fibrillation Doing well s/p ablation  stop tikosyn chads2vasc score is 3.  Continue xarelto  2. HTN Stable No change required today  3. Obesity Body mass index is 32.69 kg/m. Wt Readings from Last 3 Encounters:  12/22/17 241 lb (109.3 kg)  10/27/17 247 lb (112 kg)  09/24/17 249 lb 9 oz (113.2 kg)   Improving with lifestyle modification  4. CAD No ischemic symptoms  5. Chronic diastolic dysfunction Stable No change required today  Return to see me in 3 months  Hillis Range MD, [facility] 12/22/2017 12:14 PM

## 2017-12-22 NOTE — Patient Instructions (Addendum)
Medication Instructions:  Your physician recommends that you continue on your current medications as directed. Please refer to the Current Medication list given to you today.  STOP Tikosyn when you finish what you have a home.  Labwork: None ordered     *We will only notify you of abnormal results, otherwise continue current treatment plan.  Testing/Procedures: None ordered  Follow-Up: Your physician recommends that you schedule a follow-up appointment in: 3 months with Dr. Johney FrameAllred.   * If you need a refill on your cardiac medications before your next appointment, please call your pharmacy.   *Please note that any paperwork needing to be filled out by the provider will need to be addressed at the front desk prior to seeing the provider. Please note that any FMLA, disability or other documents regarding health condition is subject to a $25.00 charge that must be received prior to completion of paperwork in the form of a money order or check.  Thank you for choosing [facility]!!

## 2017-12-23 NOTE — Addendum Note (Signed)
Addended by: Micki RileySHOFFNER, Ethie Curless C on: 12/23/2017 02:57 PM   Modules accepted: Orders

## 2017-12-24 ENCOUNTER — Telehealth: Payer: Self-pay

## 2017-12-24 NOTE — Telephone Encounter (Signed)
[facility] that [facility] this Pantoprazole PA. Approval good from 11/24/2017 until 12/24/2018.  I have notified the pts pharmacy and sent approval letter to be scanned into the pts chart.

## 2017-12-24 NOTE — Telephone Encounter (Signed)
I did a Pantoprazole PA through covermymeds and received the following message: Bartolo DarterOTHA Malek JR Key: [id number] - PA Case ID: [id number] Outcome  Approved today 12/24/2017

## 2018-02-02 ENCOUNTER — Other Ambulatory Visit (HOSPITAL_COMMUNITY): Payer: Self-pay | Admitting: Family Medicine

## 2018-02-02 ENCOUNTER — Other Ambulatory Visit (HOSPITAL_COMMUNITY): Payer: Self-pay | Admitting: Nurse Practitioner

## 2018-02-02 ENCOUNTER — Ambulatory Visit (HOSPITAL_COMMUNITY)
Admission: RE | Admit: 2018-02-02 | Discharge: 2018-02-02 | Disposition: A | Discharge status: Home / Self Care | Payer: Federal, State, Local not specified - PPO | Source: Ambulatory Visit | Attending: Nurse Practitioner | Admitting: Nurse Practitioner

## 2018-02-02 DIAGNOSIS — M79661 Pain in right lower leg: Secondary | ICD-10-CM

## 2018-02-02 DIAGNOSIS — M7989 Other specified soft tissue disorders: Principal | ICD-10-CM

## 2018-02-02 DIAGNOSIS — M79604 Pain in right leg: Secondary | ICD-10-CM | POA: Insufficient documentation

## 2018-04-13 ENCOUNTER — Ambulatory Visit: Payer: Federal, State, Local not specified - PPO | Admitting: Internal Medicine

## 2018-04-13 ENCOUNTER — Encounter: Payer: Self-pay | Admitting: Internal Medicine

## 2018-04-13 ENCOUNTER — Encounter (INDEPENDENT_AMBULATORY_CARE_PROVIDER_SITE_OTHER): Payer: Self-pay

## 2018-04-13 VITALS — BP 130/82 | HR 81 | Ht 72.0 in | Wt 241.2 lb

## 2018-04-13 DIAGNOSIS — I10 Essential (primary) hypertension: Secondary | ICD-10-CM | POA: Diagnosis not present

## 2018-04-13 DIAGNOSIS — E669 Obesity, unspecified: Secondary | ICD-10-CM

## 2018-04-13 DIAGNOSIS — I4819 Other persistent atrial fibrillation: Secondary | ICD-10-CM

## 2018-04-13 DIAGNOSIS — I2510 Atherosclerotic heart disease of native coronary artery without angina pectoris: Secondary | ICD-10-CM | POA: Diagnosis not present

## 2018-04-13 DIAGNOSIS — I5032 Chronic diastolic (congestive) heart failure: Secondary | ICD-10-CM

## 2018-04-13 NOTE — Progress Notes (Signed)
PCP: Gareth Morgan, MD Primary Cardiologist: Dr Royann Shivers Primary EP: Dr Johney Frame  [name] is a 57 y.o. male who presents today for routine electrophysiology followup.  Since last being seen in our clinic, the patient reports doing very well.  He has had only rare palpitations since stopping tikosyn.  He is pleased with current health state.  Today, he denies symptoms of palpitations, chest pain, shortness of breath,  lower extremity edema, dizziness, presyncope, or syncope.  The patient is otherwise without complaint today.   Past Medical History:  Diagnosis Date  . Abdominal distention   . Anemia    Postoperative  . Chronic diastolic CHF (congestive heart failure) (HCC)   . Coronary artery disease    a. premature - acute MI age 30 s/p BMS to prox LAD. b. s/p DES to RCA, PLA, mLAD in 2007. c. 4V CABG in 2008 with LIMA to LAD, SVG to first diagonal, SVG to first OM, SVG to PDA  . Diabetes (HCC)   . Edema   . H/O gastroesophageal reflux (GERD)   . High cholesterol   . Hypertension   . Myocardial infarction (HCC)   . Obesity   . Persistent atrial fibrillation    a. on Tikosyn, Xarelto.  . Pleural effusion    resolved with therapy   Past Surgical History:  Procedure Laterality Date  . ABLATION OF DYSRHYTHMIC FOCUS  09/23/2017  . ATRIAL FIBRILLATION ABLATION N/A 09/23/2017   Procedure: ATRIAL FIBRILLATION ABLATION;  Surgeon: Hillis Range, MD;  Location: [facility] INVASIVE CV LAB;  Service: Cardiovascular;  Laterality: N/A;  . CARDIAC CATHETERIZATION  2008  . CORONARY ANGIOPLASTY  1996  . CORONARY ARTERY BYPASS GRAFT  2008   LIMA-LAD, SVG-Dx, SVG-OM1, SVG-PDA  . LEFT HEART CATH AND CORS/GRAFTS ANGIOGRAPHY N/A 09/25/2016   Procedure: Left Heart Cath and Cors/Grafts Angiography;  Surgeon: Corky Crafts, MD;  Location: [facility] INVASIVE CV LAB;  Service: Cardiovascular;  Laterality: N/A;    ROS- all systems are reviewed and negatives except as per HPI above  Current Outpatient  Medications  Medication Sig Dispense Refill  . acetaminophen (TYLENOL) 325 MG tablet Take 2 tablets (650 mg total) by mouth every 4 (four) hours as needed for headache or mild pain.    Marland Kitchen atorvastatin (LIPITOR) 40 MG tablet Take 40 mg by mouth at bedtime.     . ferrous sulfate 325 (65 FE) MG tablet Take 325 mg by mouth daily.    . furosemide (LASIX) 40 MG tablet Take 40-80 mg by mouth daily as needed for fluid.     Marland Kitchen glimepiride (AMARYL) 4 MG tablet Take 4 mg by mouth daily with breakfast.    . HYDROcodone-acetaminophen (NORCO) 10-325 MG tablet Take 1 tablet by mouth at bedtime.     . isosorbide mononitrate (IMDUR) 30 MG 24 hr tablet TAKE ONE (1) TABLET BY MOUTH EVERY DAY (Patient taking differently: TAKE 30 MG BY MOUTH EVERY DAY) 30 tablet 8  . JANUVIA 100 MG tablet Take 100 mg by mouth daily.     Marland Kitchen losartan (COZAAR) 25 MG tablet Take 1 tablet (25 mg total) by mouth daily. (Patient taking differently: Take 25 mg by mouth at bedtime. ) 90 tablet 4  . Magnesium 250 MG TABS Take 250 mg by mouth at bedtime.     . metFORMIN (GLUCOPHAGE) 1000 MG tablet Take 1 tablet (1,000 mg total) by mouth 2 (two) times daily with a meal.    . Multiple Vitamins-Minerals (MULTIVITAMIN WITH MINERALS) tablet  Take 1 tablet by mouth 2 (two) times daily.     . nebivolol (BYSTOLIC) 10 MG tablet Take 1 tablet (10 mg total) by mouth every morning.    . nitroGLYCERIN (NITROSTAT) 0.4 MG SL tablet Place 1 tablet (0.4 mg total) under the tongue every 5 (five) minutes as needed for chest pain. 25 tablet 4  . pantoprazole (PROTONIX) 40 MG tablet Take 1 tablet (40 mg total) by mouth daily. 30 tablet 7  . potassium chloride SA (K-DUR,KLOR-CON) 20 MEQ tablet Take 1 tablet (20 mEq total) by mouth daily. 90 tablet 3  . XARELTO 20 MG TABS tablet Take 20 mg by mouth daily.      No current facility-administered medications for this visit.     Physical Exam: Vitals:   04/13/18 0822  BP: 130/82  Pulse: 81  SpO2: 98%  Weight: 241 lb  3.2 oz (109.4 kg)  Height: 6' (1.829 m)    GEN- The patient is well appearing, alert and oriented x 3 today.   Head- normocephalic, atraumatic Eyes-  Sclera clear, conjunctiva pink Ears- hearing intact Oropharynx- clear Lungs- Clear to ausculation bilaterally, normal work of breathing Heart- Regular rate and rhythm, no murmurs, rubs or gallops, PMI not laterally displaced GI- soft, NT, ND, + BS Extremities- no clubbing, cyanosis, or edema  Wt Readings from Last 3 Encounters:  04/13/18 241 lb 3.2 oz (109.4 kg)  12/22/17 241 lb (109.3 kg)  10/27/17 247 lb (112 kg)    EKG tracing ordered today is personally reviewed and shows sinus rhythm 81 bpm, PR 146 msec, QRS 98 msec, Qtc 406 msec  Assessment and Plan:  1. Persistent atrial fibrillation Doing well s/p ablation off AAD therapy chads2vasc score is 3. Continue xarelto  2. HTN Stable No change required today  3. Oerweight Body mass index is 32.71 kg/m. Wt Readings from Last 3 Encounters:  04/13/18 241 lb 3.2 oz (109.4 kg)  12/22/17 241 lb (109.3 kg)  10/27/17 247 lb (112 kg)  progress has been made over the past 6 months  4. CAD No ischemic symptoms No changes  5. Chronic diastolic dysfunction Stable No change required today  Follow-up with Dr Royann Shivers in 3 months Return in 6 months to see EP NP  Hillis Range MD, [facility] 04/13/2018 8:36 AM

## 2018-04-13 NOTE — Patient Instructions (Addendum)
Medication Instructions:  Your physician recommends that you continue on your current medications as directed. Please refer to the Current Medication list given to you today.  Labwork: None ordered.  Testing/Procedures: None ordered.  Follow-Up: Your physician wants you to follow-up in: 3 months with Dr. Royann Shivers.  Your physician wants you to follow-up in: 6 months with Gypsy Balsam, NP.   You will receive a reminder letter in the mail two months in advance. If you don't receive a letter, please call our office to schedule the follow-up appointment.   Any Other Special Instructions Will Be Listed Below (If Applicable).  If you need a refill on your cardiac medications before your next appointment, please call your pharmacy.

## 2018-04-30 IMAGING — DX DG CHEST 2V
2 series · 2 of 2 positions shown · non-contrast
Comparison: 08/20/2007

CLINICAL DATA: Gradual onset of chest pain

EXAM:
CHEST  2 VIEW

[chest pa]
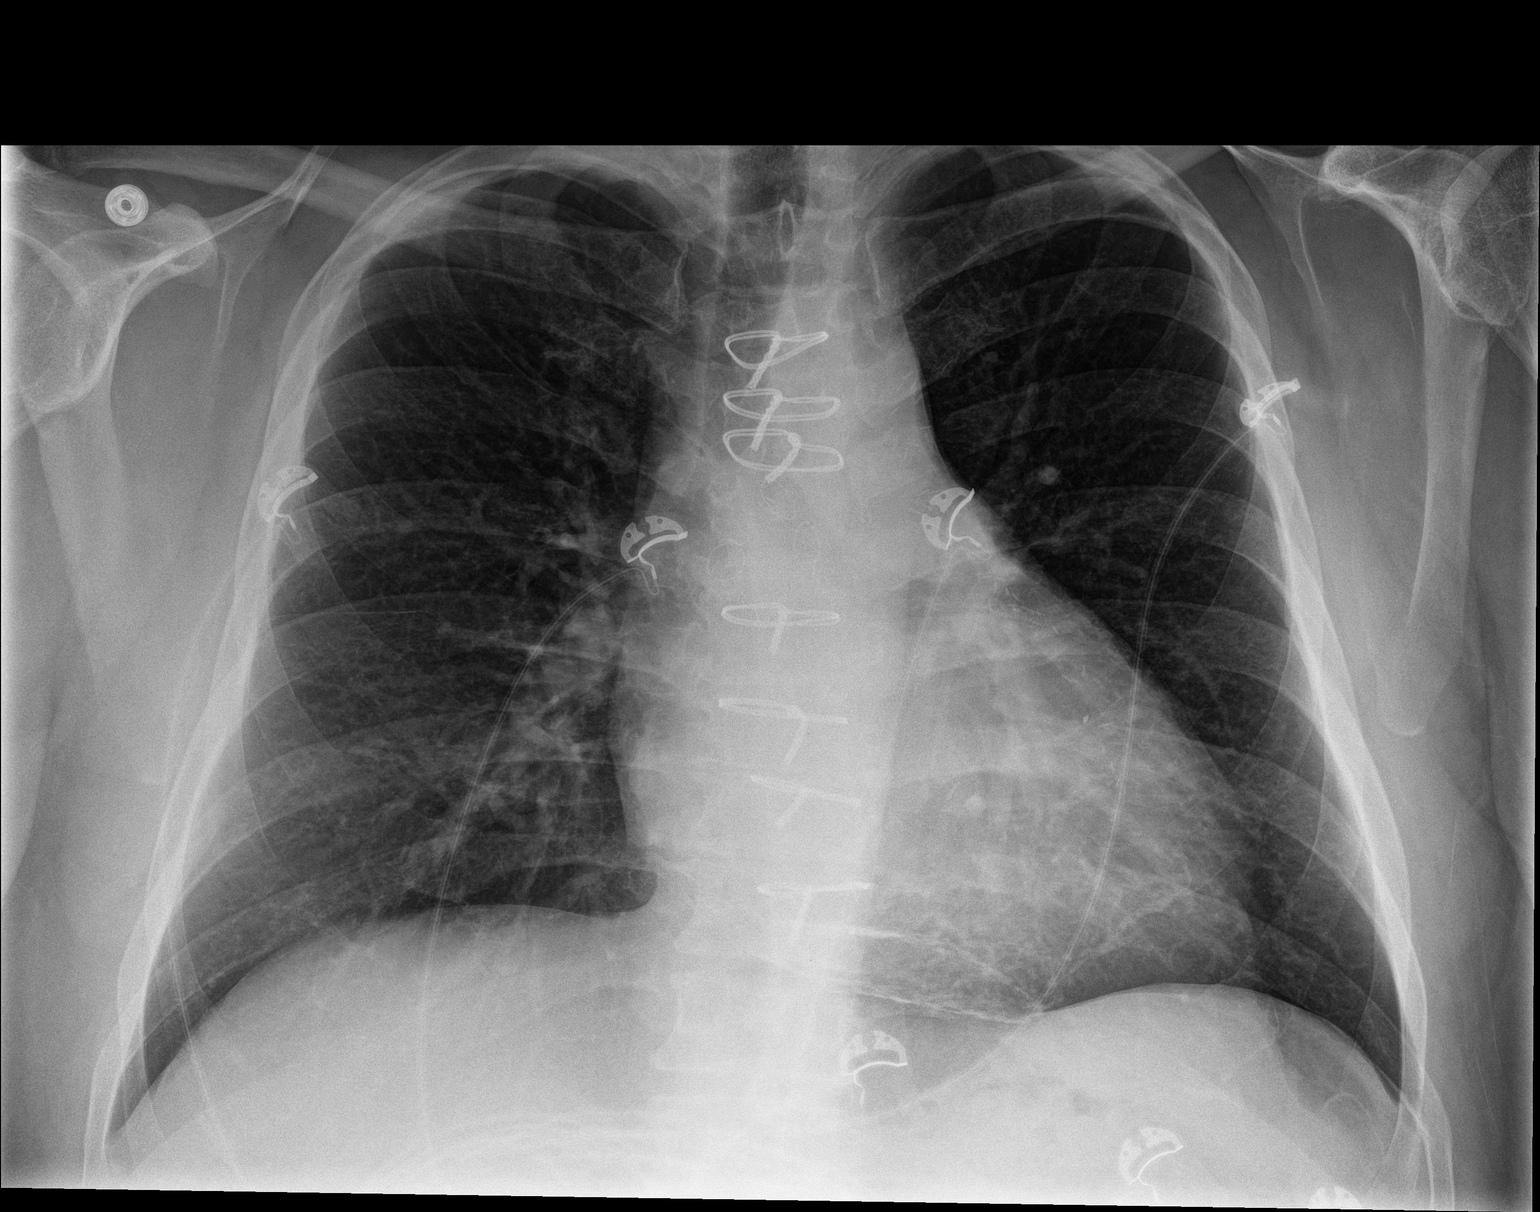

[chest lat]
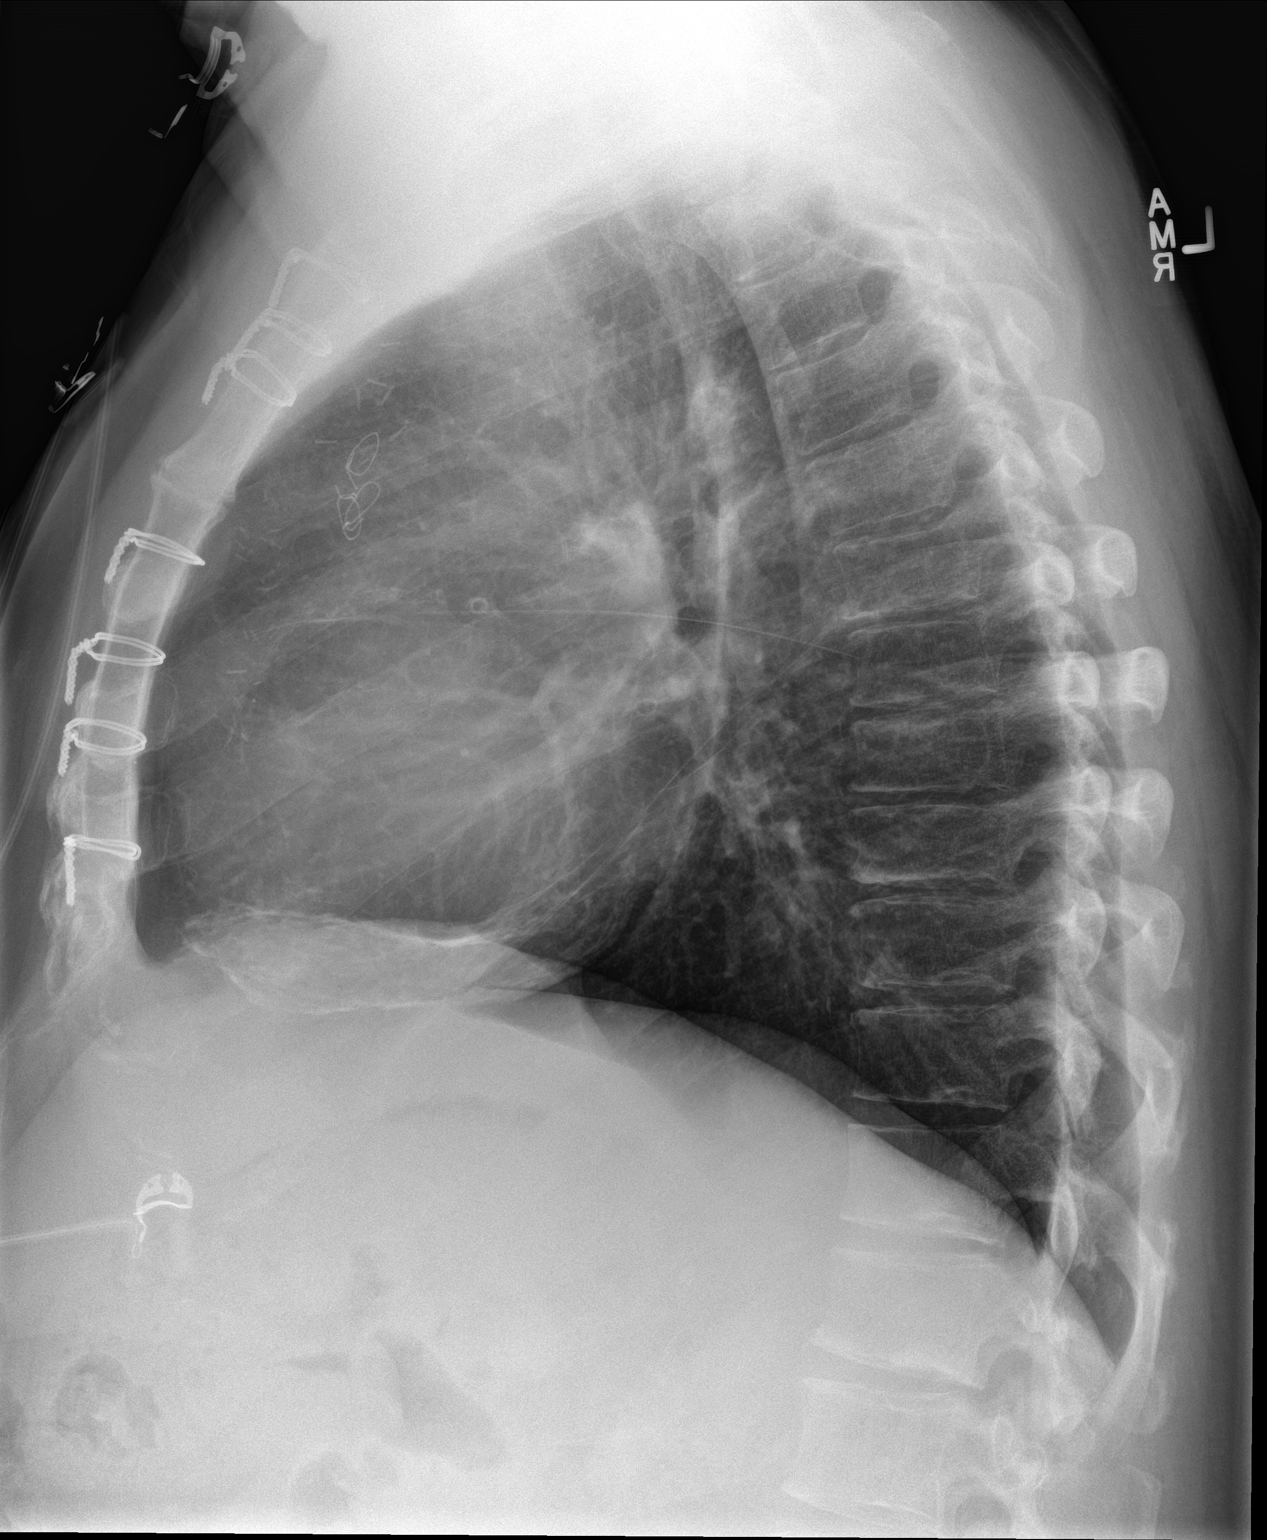

[2 of 2 positions shown; findings below may reference images not displayed]

FINDINGS: Mild cardiomegaly. Status post CABG. Sheet like calcification along
the inferior left ventricle. There is no edema, consolidation,
effusion, or pneumothorax.
IMPRESSION: 1. No acute finding.
2. Inferior left ventricular calcification which could reflect a LV
aneurysm. Recommend follow-up.
3. CABG and chronic cardiomegaly.

## 2018-07-10 ENCOUNTER — Other Ambulatory Visit: Payer: Self-pay | Admitting: Internal Medicine

## 2018-07-10 MED ORDER — PANTOPRAZOLE SODIUM 40 MG PO TBEC: 40.0000 mg | DELAYED_RELEASE_TABLET | Freq: Every day | ORAL | 9 refills | Status: DC

## 2018-08-10 ENCOUNTER — Ambulatory Visit: Payer: Federal, State, Local not specified - PPO | Admitting: Cardiovascular Disease

## 2018-10-29 ENCOUNTER — Telehealth: Payer: Self-pay | Admitting: Cardiology

## 2018-10-29 NOTE — Telephone Encounter (Signed)
Smartphone/ declined my chart/ consent/ pre reg completed °

## 2018-10-30 ENCOUNTER — Telehealth: Payer: Self-pay

## 2018-10-30 ENCOUNTER — Telehealth (INDEPENDENT_AMBULATORY_CARE_PROVIDER_SITE_OTHER): Payer: Federal, State, Local not specified - PPO | Admitting: Cardiology

## 2018-10-30 ENCOUNTER — Encounter: Payer: Self-pay | Admitting: Cardiology

## 2018-10-30 DIAGNOSIS — I2510 Atherosclerotic heart disease of native coronary artery without angina pectoris: Secondary | ICD-10-CM | POA: Diagnosis not present

## 2018-10-30 DIAGNOSIS — Z7901 Long term (current) use of anticoagulants: Secondary | ICD-10-CM | POA: Diagnosis not present

## 2018-10-30 DIAGNOSIS — Z951 Presence of aortocoronary bypass graft: Secondary | ICD-10-CM | POA: Diagnosis not present

## 2018-10-30 DIAGNOSIS — I10 Essential (primary) hypertension: Secondary | ICD-10-CM | POA: Diagnosis not present

## 2018-10-30 DIAGNOSIS — E785 Hyperlipidemia, unspecified: Secondary | ICD-10-CM

## 2018-10-30 DIAGNOSIS — I4819 Other persistent atrial fibrillation: Secondary | ICD-10-CM

## 2018-10-30 NOTE — Progress Notes (Signed)
Virtual Visit via Video Note   This visit type was conducted due to national recommendations for restrictions regarding the COVID-19 Pandemic (e.g. social distancing) in an effort to limit this patient's exposure and mitigate transmission in our community.  Due to his co-morbid illnesses, this patient is at least at moderate risk for complications without adequate follow up.  This format is felt to be most appropriate for this patient at this time.  All issues noted in this document were discussed and addressed.  A limited physical exam was performed with this format.  Please refer to the patient's chart for his consent to telehealth for [facility].  Evaluation Performed:  Follow-up visit  This visit type was conducted due to national recommendations for restrictions regarding the COVID-19 Pandemic (e.g. social distancing).  This format is felt to be most appropriate for this patient at this time.  All issues noted in this document were discussed and addressed.  No physical exam was performed (except for noted visual exam findings with Video Visits).  Please refer to the patient's chart (MyChart message for video visits and phone note for telephone visits) for the patient's consent to telehealth for [facility].  Date:  10/30/2018   ID:  [name], DOB May 06, 1961, MRN [id number]  Patient Location: Home [address] [place] [address]   Provider location:   [place]  PCP:  Gareth Morgan, MD  Cardiologist:  Thurmon Fair, MD  Electrophysiologist:  Hillis Range, MD   Chief Complaint:  Routine follow up  History of Present Illness:    [name] is a 58 y.o. male who presents via audio/video conferencing for a telehealth visit today.  Patient has a history of early onset coronary disease in his 36's.  He had multiple PCI's and ultimately CABG x4 in 2008.  Catheterization in March 2018 revealed patent grafts with an LIMA to LAD, SVG to DX 1, SVG to PDA, and an SVG  to OM1.  His LV function was normal at catheterization.  The patient has a history of PAF and has been followed by Dr. Johney Frame.  He had radiofrequency ablation in March 2019.  He is on chronic Xarelto.  Other medical issues include non-insulin-dependent diabetes, hypertension, and dyslipidemia.  His labs are followed by Dr. Sudie Bailey.  The patient denies any chest pain, he says he has not used nitroglycerin in years.  He has occasional palpitations but he says "98% of the time fine."  He has noted some bleeding from his gums.  Last week this was pretty significant and lasted more than 12 hours.  He also tells me that Dr. Sudie Bailey has told him that his blood count has drifted down.  The patient denies any melena.  His blood pressures controlled, he says his blood pressure usually runs in the 110- 120 over 70 range.  The patient does not symptoms concerning for COVID-19 infection (fever, chills, cough, or new SHORTNESS OF BREATH).    Prior CV studies:   The following studies were reviewed today: Cath March 2018  Past Medical History:  Diagnosis Date  . Abdominal distention   . Anemia    Postoperative  . Chronic diastolic CHF (congestive heart failure) (HCC)   . Coronary artery disease    a. premature - acute MI age 63 s/p BMS to prox LAD. b. s/p DES to RCA, PLA, mLAD in 2007. c. 4V CABG in 2008 with LIMA to LAD, SVG to first diagonal, SVG to first OM, SVG to  PDA  . Diabetes (HCC)   . Edema   . H/O gastroesophageal reflux (GERD)   . High cholesterol   . Hypertension   . Myocardial infarction (HCC)   . Obesity   . Persistent atrial fibrillation    a. on Tikosyn, Xarelto.  . Pleural effusion    resolved with therapy   Past Surgical History:  Procedure Laterality Date  . ABLATION OF DYSRHYTHMIC FOCUS  09/23/2017  . ATRIAL FIBRILLATION ABLATION N/A 09/23/2017   Procedure: ATRIAL FIBRILLATION ABLATION;  Surgeon: Hillis Range, MD;  Location: [facility] INVASIVE CV LAB;  Service: Cardiovascular;   Laterality: N/A;  . CARDIAC CATHETERIZATION  2008  . CORONARY ANGIOPLASTY  1996  . CORONARY ARTERY BYPASS GRAFT  2008   LIMA-LAD, SVG-Dx, SVG-OM1, SVG-PDA  . LEFT HEART CATH AND CORS/GRAFTS ANGIOGRAPHY N/A 09/25/2016   Procedure: Left Heart Cath and Cors/Grafts Angiography;  Surgeon: Corky Crafts, MD;  Location: [facility] INVASIVE CV LAB;  Service: Cardiovascular;  Laterality: N/A;     Current Meds  Medication Sig  . acetaminophen (TYLENOL) 325 MG tablet Take 2 tablets (650 mg total) by mouth every 4 (four) hours as needed for headache or mild pain.  Marland Kitchen atorvastatin (LIPITOR) 40 MG tablet Take 40 mg by mouth at bedtime.   . ferrous sulfate 325 (65 FE) MG tablet Take 325 mg by mouth daily.  . furosemide (LASIX) 40 MG tablet Take 40-80 mg by mouth daily as needed for fluid.   Marland Kitchen glimepiride (AMARYL) 4 MG tablet Take 4 mg by mouth daily with breakfast.  . HYDROcodone-acetaminophen (NORCO) 10-325 MG tablet Take 1 tablet by mouth at bedtime.   . isosorbide mononitrate (IMDUR) 30 MG 24 hr tablet TAKE ONE (1) TABLET BY MOUTH EVERY DAY (Patient taking differently: TAKE 30 MG BY MOUTH EVERY DAY)  . JANUVIA 100 MG tablet Take 100 mg by mouth daily.   Marland Kitchen losartan (COZAAR) 25 MG tablet Take 1 tablet (25 mg total) by mouth daily. (Patient taking differently: Take 25 mg by mouth at bedtime. )  . metFORMIN (GLUCOPHAGE) 1000 MG tablet Take 1 tablet (1,000 mg total) by mouth 2 (two) times daily with a meal.  . Multiple Vitamins-Minerals (MULTIVITAMIN WITH MINERALS) tablet Take 1 tablet by mouth 2 (two) times daily.   . nebivolol (BYSTOLIC) 10 MG tablet Take 1 tablet (10 mg total) by mouth every morning.  . nitroGLYCERIN (NITROSTAT) 0.4 MG SL tablet Place 1 tablet (0.4 mg total) under the tongue every 5 (five) minutes as needed for chest pain.  . pantoprazole (PROTONIX) 40 MG tablet Take 1 tablet (40 mg total) by mouth daily.  . potassium chloride SA (K-DUR,KLOR-CON) 20 MEQ tablet Take 1 tablet (20 mEq total) by  mouth daily.  [name] 20 MG TABS tablet Take 20 mg by mouth daily.      Allergies:   Patient has no known allergies.   Social History   Tobacco Use  . Smoking status: Former Smoker    Packs/day: 2.00    Years: 30.00    Pack years: 60.00    Types: Cigarettes  . Smokeless tobacco: Never Used  Substance Use Topics  . Alcohol use: Yes    Alcohol/week: 1.0 standard drinks    Types: 1 Standard drinks or equivalent per week    Comment: rare  . Drug use: No     Family Hx: The patient's family history includes CAD in his father; Vascular Disease in his father.  ROS:   Please see the history  of present illness.    All other systems reviewed and are negative.   Labs/Other Tests and Data Reviewed:    Recent Labs: No results found for requested labs within last 8760 hours.   Recent Lipid Panel Lab Results  Component Value Date/Time   CHOL 91 09/24/2016 01:45 PM   TRIG 97 09/24/2016 01:45 PM   HDL 23 (L) 09/24/2016 01:45 PM   CHOLHDL 4.0 09/24/2016 01:45 PM   LDLCALC 49 09/24/2016 01:45 PM    Wt Readings from Last 3 Encounters:  10/30/18 242 lb (109.8 kg)  04/13/18 241 lb 3.2 oz (109.4 kg)  12/22/17 241 lb (109.3 kg)     Exam:    Vital Signs:  BP (!) 149/88   Pulse 94   Ht 6' (1.829 m)   Wt 242 lb (109.8 kg)   BMI 32.82 kg/m    Well nourished, well developed male in no acute distress.   ASSESSMENT & PLAN:    Persistent atrial fibrillation S/P RFA March 2019   Chronic anticoagulation- On Xarelto but some bleeding issues and anemia. I will see if pharmacy can arrange changing to Eliquis (he is currently on medication assistance for Xarelto).  CAD- CABG x 4 2008- cath March 2018- patent grafts, normal LVF  HTN- Controlled  NIDDM- Followed by PCP  COVID-19 Education: The signs and symptoms of COVID-19 were discussed with the patient and how to seek care for testing (follow up with PCP or arrange E-visit).  The importance of social distancing was  discussed today.  Patient Risk:   After full review of this patients clinical status, I feel that they are at least moderate risk at this time.  Time:   Today, I have spent 20 minutes with the patient with telehealth technology discussing bleeding, B/P control, chest pain, atrial fibrillation.     Medication Adjustments/Labs and Tests Ordered: Current medicines are reviewed at length with the patient today.  Concerns regarding medicines are outlined above.  Tests Ordered: No orders of the defined types were placed in this encounter.  Medication Changes: No orders of the defined types were placed in this encounter.   Disposition:  Pharmacy consult- f/u Dr Royann Shiversroitoru in October  Jolene ProvostSigned, Indica Marcott, Cordelia Poche-C  10/30/2018 9:53 AM    [facility]

## 2018-10-30 NOTE — Telephone Encounter (Signed)
Contacted patient to discuss AVS instructions. Patient notified and voiced understanding. 

## 2018-10-30 NOTE — Patient Instructions (Signed)
Medication Instructions:  Your physician recommends that you continue on your current medications as directed. Please refer to the Current Medication list given to you today. If you need a refill on your cardiac medications before your next appointment, please call your pharmacy.   Lab work: None  If you have labs (blood work) drawn today and your tests are completely normal, you will receive your results only by: Marland Kitchen MyChart Message (if you have MyChart) OR . A paper copy in the mail If you have any lab test that is abnormal or we need to change your treatment, we will call you to review the results.  Testing/Procedures: None   Follow-Up: At [facility], you and your health needs are our priority.  As part of our continuing mission to provide you with exceptional heart care, we have created designated Provider Care Teams.  These Care Teams include your primary Cardiologist (physician) and Advanced Practice Providers (APPs -  Physician Assistants and Nurse Practitioners) who all work together to provide you with the care you need, when you need it. You will need a follow up appointment in 5 months(October).  Please call our office 2 months in advance to schedule this appointment.  You may see Thurmon Fair, MD or one of the following Advanced Practice Providers on your designated Care Team: [place], [place] . Micah Flesher, [name]  Your physician recommends that you schedule a follow-up appointment in: PHARM D APPT  Virtual visit to switch Xarelto to Eliquis  Any Other Special Instructions Will Be Listed Below (If Applicable).

## 2018-11-02 ENCOUNTER — Telehealth: Payer: Self-pay | Admitting: Pharmacist

## 2018-11-02 MED ORDER — APIXABAN 5 MG PO TABS: 5.0000 mg | ORAL_TABLET | Freq: Two times a day (BID) | ORAL | 4 refills | Status: DC

## 2018-11-02 NOTE — Telephone Encounter (Signed)
Patient to transition from Xarelto to Eliquis.  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  1. Rx for Eliquis 5mg  BID sent to prefer pharmacy  2. Instructed to take 1st dose of eliquies 24 hours after last Xarelto dose.  3. 30 day free card plus co-pay cards mailed to patient.

## 2019-04-09 ENCOUNTER — Other Ambulatory Visit: Payer: Self-pay | Admitting: Internal Medicine

## 2019-04-25 IMAGING — CT CT HEART MORPH/PULM VEIN W/ CM & W/O CA SCORE
2 of 6 series · 11 of 20 positions shown, 13 images · IV contrast (APPLIED)
Comparison: Chest CT 06/02/2017.

CLINICAL DATA: 56-year-old male with persistent atrial fibrillation
scheduled for an ablation.

EXAM:
Cardiac CT/CTA
TECHNIQUE: The patient was scanned on a Siemens Somatom scanner.

[Series 5: best diast · axial · 0.38mm/px · z∈[+1069,+1186]mm · 6 of 409 slices shown, 8 images]
[im 59/409  vessel]
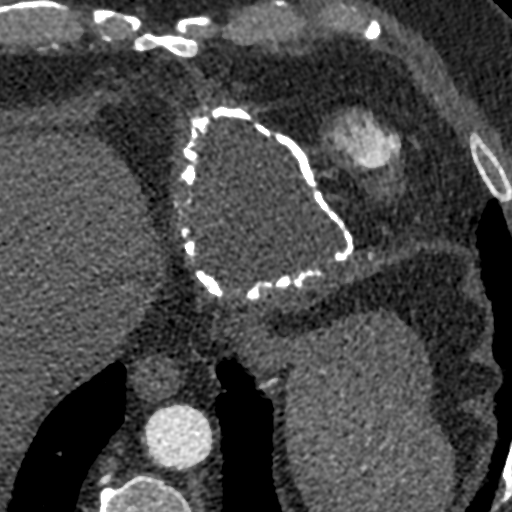
[im 59/409  lung]
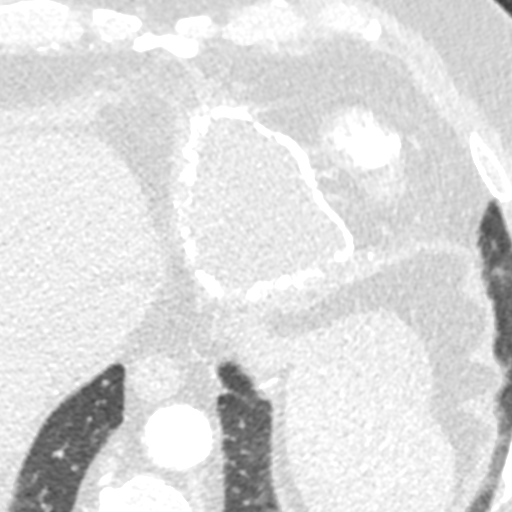
[im 117/409  vessel]
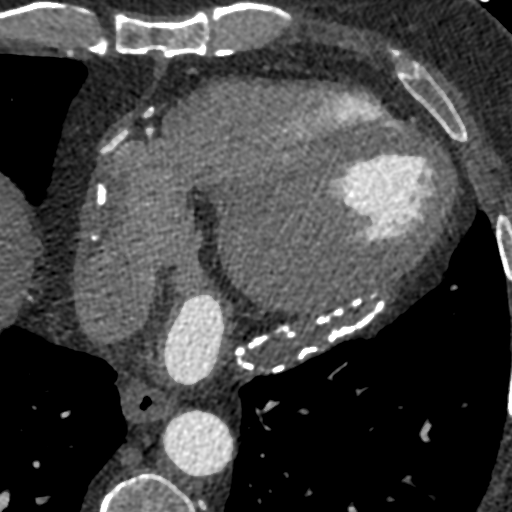
[im 175/409  vessel]
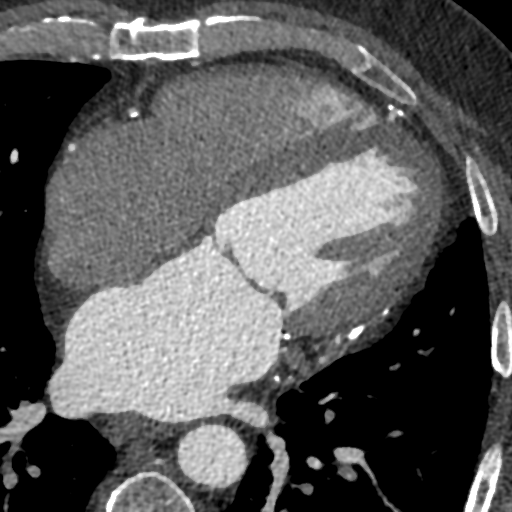
[im 234/409  vessel]
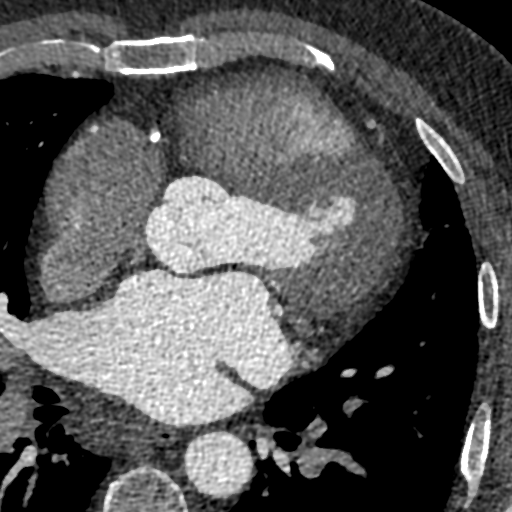
[im 292/409  vessel]
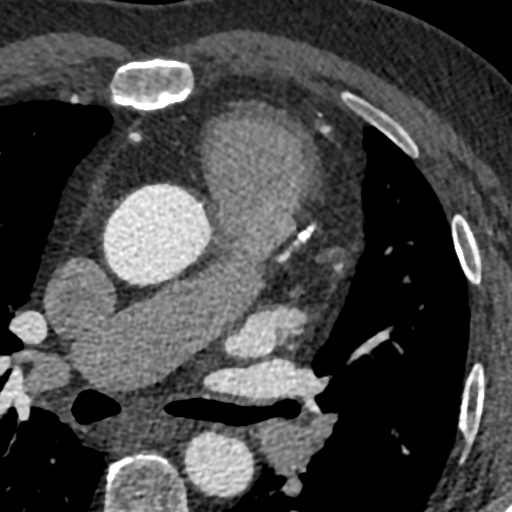
[im 292/409  lung]
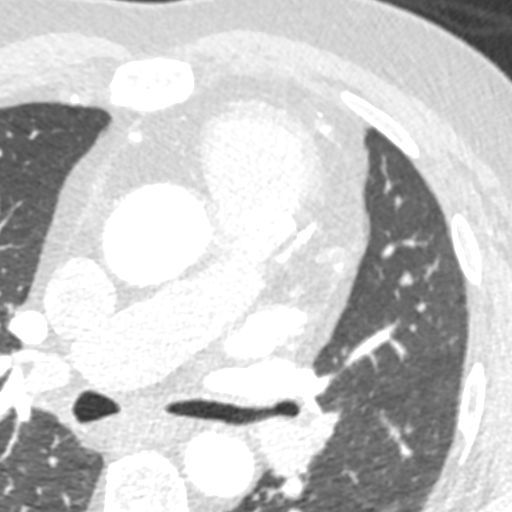
[im 350/409  vessel]
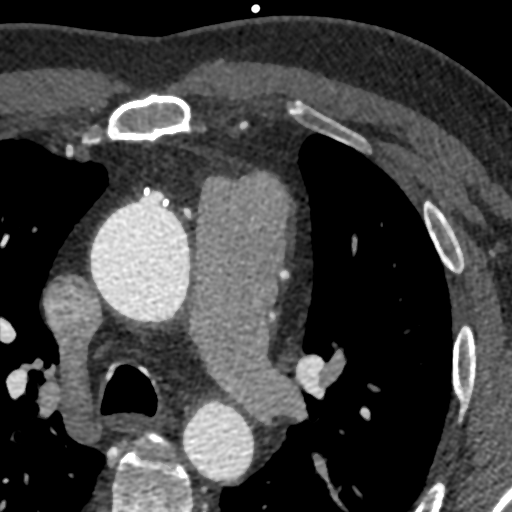

[Series 7: +300 ms · axial · 0.38mm/px · z∈[+1073,+1182]mm · 5 of 409 slices shown]
[im 69/409  vessel]
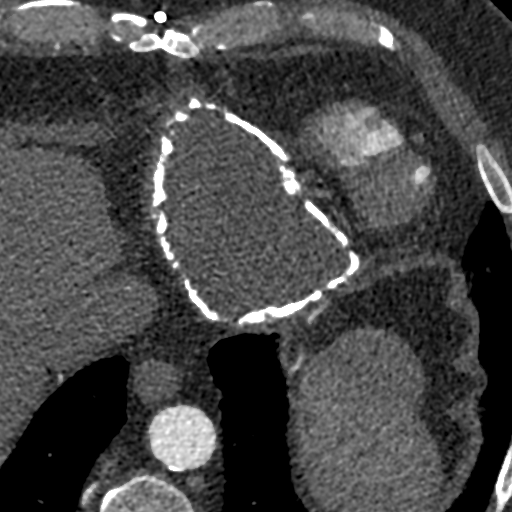
[im 137/409  vessel]
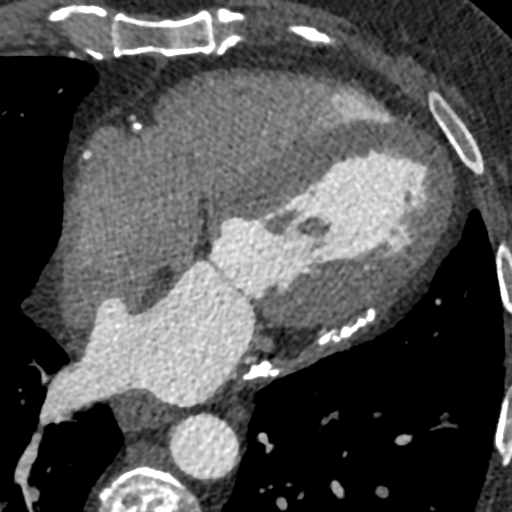
[im 205/409  vessel]
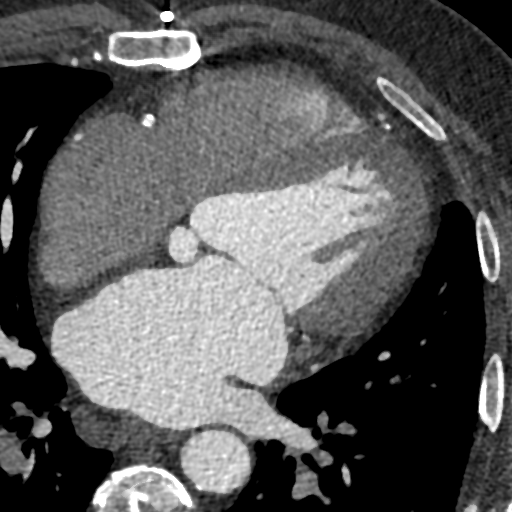
[im 273/409  vessel]
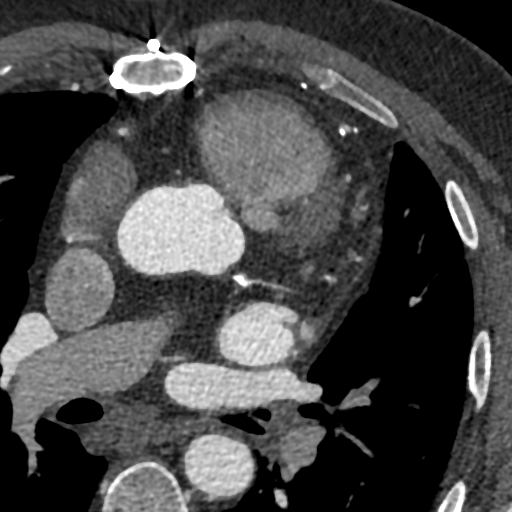
[im 341/409  vessel]
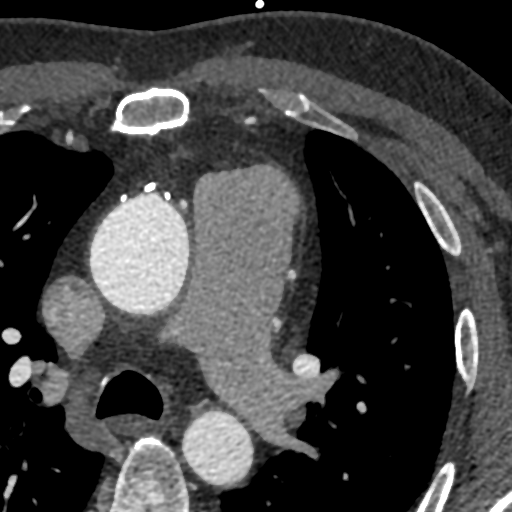

[11 of 20 positions shown; findings below may reference images not displayed]

FINDINGS: A 120 kV prospective scan was triggered in the descending thoracic
aorta at 111 HU's. Gantry rotation speed was 280 msecs and
collimation was .9 mm. No beta blockade and no NTG was given. The 3D
data set was reconstructed in 5% intervals of the 60-80 % of the R-R
cycle. Diastolic phases were analyzed on a dedicated work station
using MPR, MIP and VRT modes. The patient received 80 cc of
contrast.

There is normal pulmonary vein drainage into the left atrium (2 on
the right and 2 on the left) with ostial measurements as follows:

RUPV: 29 x 26 mm

RLPV: 26 x 24 mm

LUPV: 22 x 12 mm

LLPV: 26 x 16 mm

The left atrial appendage is medium size with broccoli morphology
and two lobes. There is no thrombus in the left atrial appendage.

The esophagus runs in the left atrial midline and is not in the
proximity to any of the pulmonary veins.

Aorta: Normal caliber. No dissection, mild diffuse calcifications
and atheroma.

Aortic Valve:  Trileaflet.  No calcifications.
IMPRESSION: 1. There is normal pulmonary vein drainage into the left atrium.

2. The left atrial appendage is medium size with broccoli morphology
and two lobes. There is no thrombus in the left atrial appendage.

3. The esophagus runs in the left atrial midline and is not in the
proximity to any of the pulmonary veins.

4. Dilated pulmonary artery measuring 39 x 34 mm suggestive of
pulmonary hypertension.

5. There are extensive pericardial calcifications adjacent to the
inferior portion of the heart suggestive of prior pericarditis or
granulomatous disease.

EXAM:
OVER-READ INTERPRETATION  CT CHEST

The following report is an over-read performed by radiologist Dr.
Semaj Franck [REDACTED] on 09/19/2017. This
over-read does not include interpretation of cardiac or coronary
anatomy or pathology. The coronary calcium score/coronary CTA
interpretation by the cardiologist is attached.
FINDINGS: Aortic atherosclerosis. Median sternotomy wires for prior CABG with
[REDACTED] to the LAD. Within the visualized portions of the thorax there
are no suspicious appearing pulmonary nodules or masses, there is no
acute consolidative airspace disease, no pleural effusions, no
pneumothorax and no lymphadenopathy. Visualized portions of the
upper abdomen are unremarkable. There are no aggressive appearing
lytic or blastic lesions noted in the visualized portions of the
skeleton.
IMPRESSION: 1.  Aortic Atherosclerosis ([id number]).

## 2019-09-07 ENCOUNTER — Other Ambulatory Visit: Payer: Self-pay | Admitting: Cardiology

## 2019-09-22 DIAGNOSIS — M545 Low back pain: Secondary | ICD-10-CM | POA: Diagnosis not present

## 2019-09-22 DIAGNOSIS — J309 Allergic rhinitis, unspecified: Secondary | ICD-10-CM | POA: Diagnosis not present

## 2019-09-22 DIAGNOSIS — E1142 Type 2 diabetes mellitus with diabetic polyneuropathy: Secondary | ICD-10-CM | POA: Diagnosis not present

## 2019-09-22 DIAGNOSIS — I10 Essential (primary) hypertension: Secondary | ICD-10-CM | POA: Diagnosis not present

## 2019-11-15 DIAGNOSIS — H90A21 Sensorineural hearing loss, unilateral, right ear, with restricted hearing on the contralateral side: Secondary | ICD-10-CM | POA: Diagnosis not present

## 2019-11-15 DIAGNOSIS — H6121 Impacted cerumen, right ear: Secondary | ICD-10-CM | POA: Diagnosis not present

## 2020-01-04 DIAGNOSIS — E782 Mixed hyperlipidemia: Secondary | ICD-10-CM | POA: Diagnosis not present

## 2020-01-04 DIAGNOSIS — Z79891 Long term (current) use of opiate analgesic: Secondary | ICD-10-CM | POA: Diagnosis not present

## 2020-01-04 DIAGNOSIS — E1165 Type 2 diabetes mellitus with hyperglycemia: Secondary | ICD-10-CM | POA: Diagnosis not present

## 2020-01-04 DIAGNOSIS — R799 Abnormal finding of blood chemistry, unspecified: Secondary | ICD-10-CM | POA: Diagnosis not present

## 2020-01-10 ENCOUNTER — Ambulatory Visit (HOSPITAL_COMMUNITY)
Admission: RE | Admit: 2020-01-10 | Discharge: 2020-01-10 | Disposition: A | Discharge status: Home / Self Care | Payer: Federal, State, Local not specified - PPO | Source: Ambulatory Visit | Attending: Family Medicine | Admitting: Family Medicine

## 2020-01-10 ENCOUNTER — Other Ambulatory Visit (HOSPITAL_COMMUNITY): Payer: Self-pay | Admitting: Family Medicine

## 2020-01-10 ENCOUNTER — Other Ambulatory Visit: Payer: Self-pay

## 2020-01-10 DIAGNOSIS — M25551 Pain in right hip: Secondary | ICD-10-CM | POA: Diagnosis not present

## 2020-01-10 DIAGNOSIS — E1142 Type 2 diabetes mellitus with diabetic polyneuropathy: Secondary | ICD-10-CM | POA: Diagnosis not present

## 2020-01-10 DIAGNOSIS — M25559 Pain in unspecified hip: Secondary | ICD-10-CM | POA: Insufficient documentation

## 2020-01-10 DIAGNOSIS — M25552 Pain in left hip: Secondary | ICD-10-CM | POA: Diagnosis not present

## 2020-01-10 DIAGNOSIS — M160 Bilateral primary osteoarthritis of hip: Secondary | ICD-10-CM | POA: Diagnosis not present

## 2020-01-10 DIAGNOSIS — M5431 Sciatica, right side: Secondary | ICD-10-CM | POA: Diagnosis not present

## 2020-01-10 DIAGNOSIS — M5432 Sciatica, left side: Secondary | ICD-10-CM | POA: Diagnosis not present

## 2020-01-10 DIAGNOSIS — E1165 Type 2 diabetes mellitus with hyperglycemia: Secondary | ICD-10-CM | POA: Diagnosis not present

## 2020-01-10 DIAGNOSIS — M545 Low back pain: Secondary | ICD-10-CM | POA: Diagnosis not present

## 2020-01-21 ENCOUNTER — Ambulatory Visit: Payer: Federal, State, Local not specified - PPO | Admitting: Cardiovascular Disease

## 2020-01-21 ENCOUNTER — Encounter: Payer: Self-pay | Admitting: Cardiovascular Disease

## 2020-01-21 ENCOUNTER — Other Ambulatory Visit: Payer: Self-pay

## 2020-01-21 VITALS — BP 133/74 | HR 88 | Ht 72.0 in | Wt 241.6 lb

## 2020-01-21 DIAGNOSIS — I4819 Other persistent atrial fibrillation: Secondary | ICD-10-CM | POA: Diagnosis not present

## 2020-01-21 DIAGNOSIS — E669 Obesity, unspecified: Secondary | ICD-10-CM

## 2020-01-21 DIAGNOSIS — E785 Hyperlipidemia, unspecified: Secondary | ICD-10-CM | POA: Diagnosis not present

## 2020-01-21 DIAGNOSIS — Z7901 Long term (current) use of anticoagulants: Secondary | ICD-10-CM

## 2020-01-21 DIAGNOSIS — I5032 Chronic diastolic (congestive) heart failure: Secondary | ICD-10-CM | POA: Diagnosis not present

## 2020-01-21 DIAGNOSIS — I25118 Atherosclerotic heart disease of native coronary artery with other forms of angina pectoris: Secondary | ICD-10-CM | POA: Diagnosis not present

## 2020-01-21 DIAGNOSIS — E1169 Type 2 diabetes mellitus with other specified complication: Secondary | ICD-10-CM

## 2020-01-21 NOTE — Progress Notes (Signed)
Cardiology Office Note    Date:  01/21/2020   ID:  [name], DOB [birth year], MRN [id number]  PCP:  Gareth Morgan, MD  Cardiologist:   Thurmon Fair, MD   Chief Complaint  Patient presents with   Coronary Artery Disease   Congestive Heart Failure   Atrial Fibrillation    History of Present Illness:  [name] is a 59 y.o. male who presents for CAD, atrial fibrillation s/p ablation.  He is generally doing well.  Has occasional problems with dizziness especially when bending over and standing up.  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  Sweats a lot.  Has been having trouble regulating his blood sugar and his most recent A1c was greater than 9%.  He has been trying to lose weight since that report.  Has not had any angina pectoris.  Has had some intestinal issues which he thinks are related to eating sugar-free candy.  Rarely has problems with shortness of breath and ankle swelling when he might take an extra dose of furosemide.  This happens less than once a month.  He never has problems with exertional chest discomfort.  Hemoglobin A1c was over 9%.  He reports that his lipid profile was good with a total cholesterol less than 100.  Have requested those labs from Dr. Sudie Bailey.  He is no longer taking any antiarrhythmics after his ablation.  He has occasional palpitations at night that resolved by the next morning and never bothered him during the day.  He continues to take anticoagulation but is now on Eliquis which he finds is better tolerated than Xarelto.  Continues to have issues with pain in his right knee.  He first presented with coronary disease at age 54 (bare-metal [id number] stent to proximal LAD in the setting of acute myocardial infarction) and had recurrent problems in 2007 (right coronary artery drug-eluting Taxus stents to the proximal RCA 3.0x12 and the mid PLA 2.5x16, followed by same admission  placement of 2 drug-eluting Cypher 3.0x13 stents to the mid LAD). Bypass surgery was performed after repeat cardiac catheterization October 2008 showed 50% proximal LAD stent restenosis followed by aneurysmal dilatation and an 85% stenosis of the LAD involving the first diagonal branch as well as moderate lesions in the left circumflex and right coronary artery. He underwent four-vessel bypass surgery in 2008. (Dr. Laneta Simmers, LIMA to LAD, SVG to first diagonal, SVG to first OM, SVG to PDA).  His most recent functional study was an echocardiogram in 2015 that showed normal overall left ventricular systolic function and a mildly dilated left atrium. He has not had a nuclear stress test since his bypass procedure.  He described classical angina pectoris and shortness of breath before his coronary events. (substernal pressure and burning, different from his reflux pain). Angina has not recurred since his bypass   He did have postoperative paroxysmal atrial fibrillation, recurred in 2014, recorded incidentally during a routine exam. He is aware of the palpitations and thinks that he does a little worse when he is in the arrhythmia. Rate control is excellent, due to chronic treatment with beta blockers. He does not have a history of stroke or TIA or other embolic events and does not have a known bleeding tendency or abnormal bleeding. CHADS2VASC score: at least 3. He failed Multaq therapy but did well for a while on Tikosyn. Underwent RF ablation ([facility]red) in March 2019 and  dofetilide was stopped.  Additional problems include type 2 diabetes mellitus, obesity, hypertension and gastroesophageal reflux disease. He has mixed hyperlipidemia. He is a former smoker. He may have a history of asbestos exposure when he worked in the [employer] yard. He continues to have 2 full-time jobs. 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   Past Medical History:  Diagnosis Date   Abdominal distention    Anemia     Postoperative   Chronic diastolic CHF (congestive heart failure) (HCC)    Coronary artery disease    a. premature - acute MI age 70 s/p BMS to prox LAD. b. s/p DES to RCA, PLA, mLAD in 2007. c. 4V CABG in 2008 with LIMA to LAD, SVG to first diagonal, SVG to first OM, SVG to PDA   Diabetes (HCC)    Edema    H/O gastroesophageal reflux (GERD)    High cholesterol    Hypertension    Myocardial infarction (HCC)    Obesity    Persistent atrial fibrillation (HCC)    a. on Tikosyn, Xarelto.   Pleural effusion    resolved with therapy    Past Surgical History:  Procedure Laterality Date   ABLATION OF DYSRHYTHMIC FOCUS  09/23/2017   ATRIAL FIBRILLATION ABLATION N/A 09/23/2017   Procedure: ATRIAL FIBRILLATION ABLATION;  Surgeon: Hillis Range, MD;  Location: [facility] INVASIVE CV LAB;  Service: Cardiovascular;  Laterality: N/A;   CARDIAC CATHETERIZATION  2008   CORONARY ANGIOPLASTY  1996   CORONARY ARTERY BYPASS GRAFT  2008   LIMA-LAD, SVG-Dx, SVG-OM1, SVG-PDA   LEFT HEART CATH AND CORS/GRAFTS ANGIOGRAPHY N/A 09/25/2016   Procedure: Left Heart Cath and Cors/Grafts Angiography;  Surgeon: Corky Crafts, MD;  [facility] INV[facility]IVE CV LAB;  Service: Cardiovascular;  Laterality: N/A;    Current Medications: Outpatient Medications Prior to Visit  Medication Sig Dispense Refill   acetaminophen (TYLENOL) 325 MG tablet Take 2 tablets (650 mg total) by mouth every 4 (four) hours as needed for headache or mild pain.     atorvastatin (LIPITOR) 40 MG tablet Take 40 mg by mouth at bedtime.      ELIQUIS 5 MG TABS tablet TAKE ONE TABLET (5MG  TOTAL) BY MOUTH TWOTIMES DAILY 60 tablet 4   ferrous sulfate 325 (65 FE) MG tablet Take 325 mg by mouth daily.     furosemide (LASIX) 40 MG tablet Take 40-80 mg by mouth daily as needed for fluid.      glimepiride (AMARYL) 4 MG tablet Take 4 mg by mouth daily with breakfast.     HYDROcodone-acetaminophen (NORCO) 10-325 MG tablet Take 1 tablet  by mouth at bedtime.      JANUVIA 100 MG tablet Take 100 mg by mouth daily.      losartan (COZAAR) 25 MG tablet Take 1 tablet (25 mg total) by mouth daily. (Patient taking differently: Take 25 mg by mouth at bedtime. ) 90 tablet 4   metFORMIN (GLUCOPHAGE) 1000 MG tablet Take 1 tablet (1,000 mg total) by mouth 2 (two) times daily with a meal.     Multiple Vitamins-Minerals (MULTIVITAMIN WITH MINERALS) tablet Take 1 tablet by mouth 2 (two) times daily.      nebivolol (BYSTOLIC) 10 MG tablet Take 1 tablet (10 mg total) by mouth every morning.     nitroGLYCERIN (NITROSTAT) 0.4 MG SL tablet Place 1 tablet (0.4 mg total) under the tongue every 5 (five) minutes as needed for chest pain. 25 tablet 4   potassium  chloride SA (K-DUR,KLOR-CON) 20 MEQ tablet Take 1 tablet (20 mEq total) by mouth daily. 90 tablet 3   isosorbide mononitrate (IMDUR) 30 MG 24 hr tablet TAKE ONE (1) TABLET BY MOUTH EVERY DAY (Patient taking differently: TAKE 30 MG BY MOUTH EVERY DAY) 30 tablet 8   pantoprazole (PROTONIX) 40 MG tablet Take 1 tablet (40 mg total) by mouth daily. 30 tablet 9   No facility-administered medications prior to visit.     Allergies:   Patient has no known allergies.   Social History   Socioeconomic History   Marital status: Married    Spouse name: Not on file   Number of children: Not on file   Years of education: Not on file   Highest education level: Not on file  Occupational History   Occupation: Sports administrator    Employer: [employer]   Occupation: Has cattle and Programme researcher, broadcasting/film/video.  Tobacco Use   Smoking status: Former Smoker    Packs/day: 2.00    Years: 30.00    Pack years: 60.00    Types: Cigarettes   Smokeless tobacco: Never Used  Building services engineer Use: Never used  Substance and Sexual Activity   Alcohol use: Yes    Alcohol/week: 1.0 standard drink    Types: 1 Standard drinks or equivalent per week    Comment: rare   Drug use: No   Sexual activity: Not on  file  Other Topics Concern   Not on file  Social History Narrative   Not on file   Social Determinants of Health   Financial Resource Strain:    Difficulty of Paying Living Expenses:   Food Insecurity:    Worried About Programme researcher, broadcasting/film/video in the Last Year:    Barista in the Last Year:   Transportation Needs:    Freight forwarder (Medical):    Lack of Transportation (Non-Medical):   Physical Activity:    Days of Exercise per Week:    Minutes of Exercise per Session:   Stress:    Feeling of Stress :   Social Connections:    Frequency of Communication with Friends and Family:    Frequency of Social Gatherings with Friends and Family:    Attends Religious Services:    Active Member of Clubs or Organizations:    Attends Engineer, structural:    Marital Status:      Family History:   significant for early onset coronary disease  ROS:   Please see the history of present illness.    ROS All other systems reviewed and are negative.   PHYSICAL EXAM:   VS:  BP (!) 133/74    Pulse 88    Ht 6' (1.829 m)    Wt (!) 241 lb 9.6 oz (109.6 kg)    SpO2 98%    BMI 32.77 kg/m     General: Alert, oriented x3, no distress, mildly obese Head: no evidence of trauma, PERRL, EOMI, no exophtalmos or lid lag, no myxedema, no xanthelasma; normal ears, nose and oropharynx Neck: normal jugular venous pulsations and no hepatojugular reflux; brisk carotid pulses without delay and no carotid bruits Chest: clear to auscultation, no signs of consolidation by percussion or palpation, normal fremitus, symmetrical and full respiratory excursions Cardiovascular: normal position and quality of the apical impulse, regular rhythm, normal first and second heart sounds, no murmurs, rubs or gallops Abdomen: no tenderness or distention, no masses by palpation, no abnormal pulsatility or arterial bruits,  normal bowel sounds, no hepatosplenomegaly Extremities: no clubbing, cyanosis  or edema; 2+ radial, ulnar and brachial pulses bilaterally; 2+ right femoral, posterior tibial and dorsalis pedis pulses; 2+ left femoral, posterior tibial and dorsalis pedis pulses; no subclavian or femoral bruits Neurological: grossly nonfocal Psych: Normal mood and affect   Wt Readings from Last 3 Encounters:  01/21/20 (!) 241 lb 9.6 oz (109.6 kg)  10/30/18 242 lb (109.8 kg)  04/13/18 241 lb 3.2 oz (109.4 kg)      Studies/Labs Reviewed:   EKG:  EKG is ordered today.  The ekg ordered today demonstrates Normal sinus rhythm, vertical axis,  QTC 409 ms, otherwise normal  Recent Labs: No results found for requested labs within last 8760 hours.   Lipid Panel    Component Value Date/Time   CHOL 91 09/24/2016 1345   TRIG 97 09/24/2016 1345   HDL 23 (L) 09/24/2016 1345   CHOLHDL 4.0 09/24/2016 1345   VLDL 19 09/24/2016 1345   LDLCALC 49 09/24/2016 1345     ASSESSMENT:    1. Persistent atrial fibrillation (HCC)   2. Chronic diastolic heart failure (HCC)   3. Coronary artery disease of native artery of native heart with stable angina pectoris (HCC)   4. Dyslipidemia (high LDL; low HDL)   5. Diabetes mellitus type 2 in obese (HCC)   6. Long term current use of anticoagulant therapy      PLAN:  In order of problems listed above:  1. AFib: s/p RFA in March 2019 with good clinical response.  Minimal symptoms.  On anticoagulation with Eliquis which causes fewer intestinal problems then Xarelto did. 2. CHF: NYHA functional class I, clinically euvolemic on a low-dose of loop diuretic.  Has some occasional problems with orthostatic hypotension.  Discussed how he can adjust his own dose of diuretic to keep his weights in optimal range, which appears to be roughly 236-242 pounds on his home scale (roughly 3-1/2 pounds higher on our office scale).  He has mild residual dyspnea but also has a history of asbestos exposure and chronic lung problems related to heavy smoking for 30  years. 3. CAD: Asymptomatic, roughly 13 years since his bypass surgery.  Stop the isosorbide mononitrate since this may be contributing to his orthostatic hypotension. 4. HLP: Requested most recent labs from Dr. Michelle NasutiKnowlton's office. Target LDL less than 70. 5. DM: He is trying hard to lose weight.  Diabetes control has been poor recently.  I think he is an excellent candidate for one of the newer diabetes drugs such as an SGLT2 inhibitor (e.g. Jardiance, Farxiga) or GLP-1 agonist (Ozempic, Trulicity, etc.). 6. Anticoagulation: Well-tolerated without bleeding complications    Medication Adjustments/Labs and Tests Ordered: Current medicines are reviewed at length with the patient today.  Concerns regarding medicines are outlined above.  Medication changes, Labs and Tests ordered today are listed in the Patient Instructions below. Patient Instructions  Medication Instructions:  Stop Isosorbide Skip  Furosemide dose if weight less than 236 pounds *If you need a refill on your cardiac medications before your next appointment, please call your pharmacy*   Lab Work: None ordered If you have labs (blood work) drawn today and your tests are completely normal, you will receive your results only by:  MyChart Message (if you have MyChart) OR  A paper copy in the mail If you have any lab test that is abnormal or we need to change your treatment, we will call you to review the results.   Testing/Procedures: None  ordered   Follow-Up: At [facility], you and your health needs are our priority.  As part of our continuing mission to provide you with exceptional heart care, we have created designated Provider Care Teams.  These Care Teams include your primary Cardiologist (physician) and Advanced Practice Providers (APPs -  Physician Assistants and Nurse Practitioners) who all work together to provide you with the care you need, when you need it.  We recommend signing up for the patient portal called  "MyChart".  Sign up information is provided on this After Visit Summary.  MyChart is used to connect with patients for Virtual Visits (Telemedicine).  Patients are able to view lab/test results, encounter notes, upcoming appointments, etc.  Non-urgent messages can be sent to your provider as well.   To learn more about what you can do with MyChart, go to [email or web address].    Your next appointment:   12 month(s)  The format for your next appointment:   In Person  Provider:   You may see Thurmon Fair, MD or one of the following Advanced Practice Providers on your designated Care Team:    Azalee Course, [name]  Micah Flesher, [place] or   Judy Pimple, [place]    Other Instructions     Signed, Thurmon Fair, MD  01/21/2020 8:25 AM    [facility] [address] [place], [place], [place]  [address] Phone: [phone]; Fax: [phone]

## 2020-01-21 NOTE — Patient Instructions (Signed)
Medication Instructions:  Stop Isosorbide Skip  Furosemide dose if weight less than 236 pounds *If you need a refill on your cardiac medications before your next appointment, please call your pharmacy*   Lab Work: None ordered If you have labs (blood work) drawn today and your tests are completely normal, you will receive your results only by: Marland Kitchen MyChart Message (if you have MyChart) OR . A paper copy in the mail If you have any lab test that is abnormal or we need to change your treatment, we will call you to review the results.   Testing/Procedures: None ordered   Follow-Up: At [facility], you and your health needs are our priority.  As part of our continuing mission to provide you with exceptional heart care, we have created designated Provider Care Teams.  These Care Teams include your primary Cardiologist (physician) and Advanced Practice Providers (APPs -  Physician Assistants and Nurse Practitioners) who all work together to provide you with the care you need, when you need it.  We recommend signing up for the patient portal called "MyChart".  Sign up information is provided on this After Visit Summary.  MyChart is used to connect with patients for Virtual Visits (Telemedicine).  Patients are able to view lab/test results, encounter notes, upcoming appointments, etc.  Non-urgent messages can be sent to your provider as well.   To learn more about what you can do with MyChart, go to [email or web address].    Your next appointment:   12 month(s)  The format for your next appointment:   In Person  Provider:   You may see Thurmon Fair, MD or one of the following Advanced Practice Providers on your designated Care Team:    Azalee Course, [name]  Micah Flesher, [place] or   Judy Pimple, [place]    Other Instructions

## 2020-01-31 ENCOUNTER — Other Ambulatory Visit: Payer: Self-pay | Admitting: Family Medicine

## 2020-01-31 ENCOUNTER — Other Ambulatory Visit (HOSPITAL_COMMUNITY): Payer: Self-pay | Admitting: Family Medicine

## 2020-01-31 DIAGNOSIS — M5431 Sciatica, right side: Secondary | ICD-10-CM

## 2020-01-31 DIAGNOSIS — M5432 Sciatica, left side: Secondary | ICD-10-CM

## 2020-02-14 ENCOUNTER — Other Ambulatory Visit: Payer: Self-pay | Admitting: Cardiology

## 2020-02-21 ENCOUNTER — Other Ambulatory Visit: Payer: Self-pay

## 2020-02-21 ENCOUNTER — Ambulatory Visit (HOSPITAL_COMMUNITY)
Admission: RE | Admit: 2020-02-21 | Discharge: 2020-02-21 | Disposition: A | Discharge status: Home / Self Care | Payer: Federal, State, Local not specified - PPO | Source: Ambulatory Visit | Attending: Family Medicine | Admitting: Family Medicine

## 2020-02-21 DIAGNOSIS — M5432 Sciatica, left side: Secondary | ICD-10-CM | POA: Diagnosis not present

## 2020-02-21 DIAGNOSIS — M5431 Sciatica, right side: Secondary | ICD-10-CM | POA: Insufficient documentation

## 2020-02-21 DIAGNOSIS — M545 Low back pain: Secondary | ICD-10-CM | POA: Diagnosis not present

## 2020-02-22 ENCOUNTER — Telehealth: Payer: Self-pay | Admitting: Cardiovascular Disease

## 2020-02-22 MED ORDER — APIXABAN 5 MG PO TABS
5.0000 mg | ORAL_TABLET | Freq: Two times a day (BID) | ORAL | 11 refills | Status: DC
Start: 2020-02-22 — End: 2021-03-13

## 2020-02-22 NOTE — Telephone Encounter (Signed)
    Pt c/o medication issue:  1. Name of Medication:   ELIQUIS 5 MG TABS tablet    2. How are you currently taking this medication (dosage and times per day)? TAKE ONE TABLET (5MG  TOTAL) BY MOUTH TWOTIMES DAILY  3. Are you having a reaction (difficulty breathing--STAT)?   4. What is your medication issue? Pt is confused when he refill his eliquis the pharmacy told him he needs to make an appt to see Dr. for future refills, he said he just saw Dr. Salena Saner last 01/21/2020

## 2020-02-22 NOTE — Telephone Encounter (Signed)
The patient has been made aware that this has been corrected.

## 2020-02-28 DIAGNOSIS — M5442 Lumbago with sciatica, left side: Secondary | ICD-10-CM | POA: Diagnosis not present

## 2020-02-28 DIAGNOSIS — M5441 Lumbago with sciatica, right side: Secondary | ICD-10-CM | POA: Diagnosis not present

## 2020-07-15 ENCOUNTER — Other Ambulatory Visit: Payer: Federal, State, Local not specified - PPO

## 2020-07-24 DIAGNOSIS — I4821 Permanent atrial fibrillation: Secondary | ICD-10-CM | POA: Diagnosis not present

## 2020-07-24 DIAGNOSIS — R600 Localized edema: Secondary | ICD-10-CM | POA: Diagnosis not present

## 2020-07-24 DIAGNOSIS — R0602 Shortness of breath: Secondary | ICD-10-CM | POA: Diagnosis not present

## 2020-07-24 DIAGNOSIS — I10 Essential (primary) hypertension: Secondary | ICD-10-CM | POA: Diagnosis not present

## 2020-11-06 DIAGNOSIS — Z7901 Long term (current) use of anticoagulants: Secondary | ICD-10-CM | POA: Diagnosis not present

## 2020-11-06 DIAGNOSIS — I10 Essential (primary) hypertension: Secondary | ICD-10-CM | POA: Diagnosis not present

## 2020-11-06 DIAGNOSIS — E1142 Type 2 diabetes mellitus with diabetic polyneuropathy: Secondary | ICD-10-CM | POA: Diagnosis not present

## 2020-11-06 DIAGNOSIS — I4821 Permanent atrial fibrillation: Secondary | ICD-10-CM | POA: Diagnosis not present

## 2020-11-08 DIAGNOSIS — I10 Essential (primary) hypertension: Secondary | ICD-10-CM | POA: Diagnosis not present

## 2020-11-08 DIAGNOSIS — E1142 Type 2 diabetes mellitus with diabetic polyneuropathy: Secondary | ICD-10-CM | POA: Diagnosis not present

## 2020-11-08 DIAGNOSIS — Z125 Encounter for screening for malignant neoplasm of prostate: Secondary | ICD-10-CM | POA: Diagnosis not present

## 2020-11-08 DIAGNOSIS — E039 Hypothyroidism, unspecified: Secondary | ICD-10-CM | POA: Diagnosis not present

## 2020-11-08 DIAGNOSIS — E782 Mixed hyperlipidemia: Secondary | ICD-10-CM | POA: Diagnosis not present

## 2021-03-13 ENCOUNTER — Other Ambulatory Visit: Payer: Self-pay | Admitting: Cardiovascular Disease

## 2021-03-13 NOTE — Telephone Encounter (Signed)
Prescription refill request for Eliquis received. Indication:atrial fib Last office visit:needs appt IFO:YDXAJ labs Age: 60 Weight:109.6 kg  Prescription refilled

## 2021-03-23 ENCOUNTER — Telehealth: Payer: Self-pay | Admitting: Internal Medicine

## 2021-03-23 NOTE — Telephone Encounter (Addendum)
Patient had Afib last night. Lasted a couple of hours. No missed doses of Nebivolol or Eliquis. Only symptoms were palpitations, IF increased activity he can become winded faster and fatigued. Patient is concerned because this is happening more frequently and lasting longer. No life style changes. BP 136/91 and HR 96  Over due for follow up, last seen with EP 2019 and Gen Cards 2021. Made appointment with EP PA on 03/27/21. ED precautions given.  Verbalized understanding.

## 2021-03-23 NOTE — Telephone Encounter (Signed)
Patient c/o Palpitations:  High priority if patient c/o lightheadedness, shortness of breath, or chest pain  How long have you had palpitations/irregular HR/ Afib? Are you having the symptoms now?  Not in Afib RIGHT NOW, pt was in Afib last night  Are you currently experiencing lightheadedness, SOB or CP? NO TO ALL  Do you have a history of afib (atrial fibrillation) or irregular heart rhythm? YES  Have you checked your BP or HR? (document readings if available): BP 136/91 HR 96  Are you experiencing any other symptoms? WHEN PT WAS IN AFIB HE FELT HIS HEARTBEAT RACING

## 2021-03-26 NOTE — Progress Notes (Signed)
PCP:  Gareth Morgan, MD Primary Cardiologist: Thurmon Fair, MD Electrophysiologist: Hillis Range, MD   [name] is a 60 y.o. male seen today for Hillis Range, MD for acute visit due to increased palpitations .  Since last being seen in our clinic the patient reports an increase in palpitations. He noticed some increase after his annual visit with Dr. Erin Hearing office last year, and has felt a gradual increase in burden since. He feels a decrease in his energy level and exercise capacity, especially when he can tell he is out of rhythm. There are days when he does NOT feel out of rhythm. Occasional tachy-palpitations that feel like a "buzzing" in his chest. Denies chest pain. No SOB, primarily fatigue. He denies exertional chest pain.   Past Medical History:  Diagnosis Date   Abdominal distention    Anemia    Postoperative   Chronic diastolic CHF (congestive heart failure) (HCC)    Coronary artery disease    a. premature - acute MI age 68 s/p BMS to prox LAD. b. s/p DES to RCA, PLA, mLAD in 2007. c. 4V CABG in 2008 with LIMA to LAD, SVG to first diagonal, SVG to first OM, SVG to PDA   Diabetes (HCC)    Edema    H/O gastroesophageal reflux (GERD)    High cholesterol    Hypertension    Myocardial infarction (HCC)    Obesity    Persistent atrial fibrillation (HCC)    a. on Tikosyn, Xarelto.   Pleural effusion    resolved with therapy   Past Surgical History:  Procedure Laterality Date   ABLATION OF DYSRHYTHMIC FOCUS  09/23/2017   ATRIAL FIBRILLATION ABLATION N/A 09/23/2017   Procedure: ATRIAL FIBRILLATION ABLATION;  Surgeon: Hillis Range, MD;  Location: [facility];  Service: Cardiovascular;  Laterality: N/A;   CARDIAC CATHETERIZATION  2008   CORONARY ANGIOPLASTY  1996   CORONARY ARTERY BYPASS GRAFT  2008   LIMA-LAD, SVG-Dx, SVG-OM1, SVG-PDA   LEFT HEART CATH AND CORS/GRAFTS ANGIOGRAPHY N/A 09/25/2016   Procedure: Left Heart Cath and Cors/Grafts Angiography;   Surgeon: Corky Crafts, MD;  Location: [facility] INVASIVE CV LAB;  Service: Cardiovascular;  Laterality: N/A;    Current Outpatient Medications  Medication Sig Dispense Refill   acetaminophen (TYLENOL) 325 MG tablet Take 2 tablets (650 mg total) by mouth every 4 (four) hours as needed for headache or mild pain.     apixaban (ELIQUIS) 5 MG TABS tablet TAKE ONE TABLET BY MOUTH TWICE A DAY 60 tablet 0   atorvastatin (LIPITOR) 40 MG tablet Take 40 mg by mouth at bedtime.      dapagliflozin propanediol (FARXIGA) 10 MG TABS tablet Take by mouth daily.     Ergocalciferol (VITAMIN D2 PO) Take by mouth.     ferrous sulfate 325 (65 FE) MG tablet Take 325 mg by mouth daily.     furosemide (LASIX) 40 MG tablet Take 40-80 mg by mouth daily as needed for fluid.      glimepiride (AMARYL) 4 MG tablet Take 4 mg by mouth daily with breakfast.     HYDROcodone-acetaminophen (NORCO) 10-325 MG tablet Take 1 tablet by mouth at bedtime.      isosorbide mononitrate (IMDUR) 30 MG 24 hr tablet Take 30 mg by mouth daily.     JANUVIA 100 MG tablet Take 100 mg by mouth daily.      losartan (COZAAR) 50 MG tablet Take 50 mg by mouth daily.  metFORMIN (GLUCOPHAGE) 1000 MG tablet Take 1 tablet (1,000 mg total) by mouth 2 (two) times daily with a meal.     Multiple Vitamins-Minerals (MULTIVITAMIN WITH MINERALS) tablet Take 1 tablet by mouth 2 (two) times daily.      nebivolol (BYSTOLIC) 10 MG tablet Take 1 tablet (10 mg total) by mouth every morning.     nitroGLYCERIN (NITROSTAT) 0.4 MG SL tablet Place 1 tablet (0.4 mg total) under the tongue every 5 (five) minutes as needed for chest pain. 25 tablet 4   potassium chloride SA (K-DUR,KLOR-CON) 20 MEQ tablet Take 1 tablet (20 mEq total) by mouth daily. 90 tablet 3   No current facility-administered medications for this visit.    No Known Allergies  Social History   Socioeconomic History   Marital status: Married    Spouse name: Not on file   Number of children: Not on  file   Years of education: Not on file   Highest education level: Not on file  Occupational History   Occupation: Sports administrator    Employer: [employer]   Occupation: Has cattle and Programme researcher, broadcasting/film/video.  Tobacco Use   Smoking status: Former    Packs/day: 2.00    Years: 30.00    Pack years: 60.00    Types: Cigarettes   Smokeless tobacco: Never  Vaping Use   Vaping Use: Never used  Substance and Sexual Activity   Alcohol use: Yes    Alcohol/week: 1.0 standard drink    Types: 1 Standard drinks or equivalent per week    Comment: rare   Drug use: No   Sexual activity: Not on file  Other Topics Concern   Not on file  Social History Narrative   Not on file   Social Determinants of Health   Financial Resource Strain: Not on file  Food Insecurity: Not on file  Transportation Needs: Not on file  Physical Activity: Not on file  Stress: Not on file  Social Connections: Not on file  Intimate Partner Violence: Not on file     Review of Systems: All other systems reviewed and are otherwise negative except as noted above.  Physical Exam: Vitals:   03/27/21 1042  BP: 128/70  Pulse: 96  SpO2: 98%  Weight: 241 lb (109.3 kg)  Height: 6' (1.829 m)    GEN- The patient is well appearing, alert and oriented x 3 today.   HEENT: normocephalic, atraumatic; sclera clear, conjunctiva pink; hearing intact; oropharynx clear; neck supple, no JVP Lymph- no cervical lymphadenopathy Lungs- Clear to ausculation bilaterally, normal work of breathing.  No wheezes, rales, rhonchi Heart- Regular rate and rhythm, no murmurs, rubs or gallops, PMI not laterally displaced GI- soft, non-tender, non-distended, bowel sounds present, no hepatosplenomegaly Extremities- no clubbing, cyanosis, or edema; DP/PT/radial pulses 2+ bilaterally MS- no significant deformity or atrophy Skin- warm and dry, no rash or lesion Psych- euthymic mood, full affect Neuro- strength and sensation are intact  EKG is ordered.  Personal review of EKG from today shows AF with controlled at 96 vpm  Additional studies reviewed include: Previous EP and gen cards notes.   Assessment and Plan:  1. Persistent atrial fibrillation S/p ablation 08/2017, previously on Tikosyn. This was stopped after ablation. He is not interested in another admission for Tikosyn or sotalol at this time. He also was frustrated navigating medications for QT prolongation and would prefer to avoid if possible.  He tolerated amiodarone post CABG briefly. Not great option given young age.  No flecainide with  CABG Previously failed Multaq with ineffectiveness.  Gradual increase in palpitations recently, but clear periods of sinus by symptoms as well. Increase Bystolic to 20 mg daily.  Remains on eliquis without bleeding or missed doses.  He is interested in re-do ablation. Will schedule with Dr. Johney Frame.   2. CAD s/p CABG Denies s/s ischemia  If symptoms worsen prior to visit with MD, he may be willing to consider AAD, and would send to AF clinic. Will update Echo to help guide therapy as well. Labs today.   Graciella Freer, [name]  03/27/21 10:58 AM

## 2021-03-27 ENCOUNTER — Other Ambulatory Visit: Payer: Self-pay

## 2021-03-27 ENCOUNTER — Encounter: Payer: Self-pay | Admitting: Student

## 2021-03-27 ENCOUNTER — Ambulatory Visit: Payer: Federal, State, Local not specified - PPO | Admitting: Student

## 2021-03-27 VITALS — BP 128/70 | HR 96 | Ht 72.0 in | Wt 241.0 lb

## 2021-03-27 DIAGNOSIS — I25118 Atherosclerotic heart disease of native coronary artery with other forms of angina pectoris: Secondary | ICD-10-CM | POA: Diagnosis not present

## 2021-03-27 DIAGNOSIS — I4819 Other persistent atrial fibrillation: Secondary | ICD-10-CM | POA: Diagnosis not present

## 2021-03-27 LAB — CBC
Hematocrit: 43.2 % (ref 37.5–51.0)
Hemoglobin: 14.3 g/dL (ref 13.0–17.7)
MCH: 29 pg (ref 26.6–33.0)
MCHC: 33.1 g/dL (ref 31.5–35.7)
MCV: 88 fL (ref 79–97)
Platelets: 233 10*3/uL (ref 150–450)
RBC: 4.93 x10E6/uL (ref 4.14–5.80)
RDW: 14.3 % (ref 11.6–15.4)
WBC: 11 10*3/uL — ABNORMAL HIGH (ref 3.4–10.8)

## 2021-03-27 LAB — BASIC METABOLIC PANEL
BUN/Creatinine Ratio: 16 (ref 9–20)
BUN: 18 mg/dL (ref 6–24)
CO2: 21 mmol/L (ref 20–29)
Calcium: 10 mg/dL (ref 8.7–10.2)
Chloride: 101 mmol/L (ref 96–106)
Creatinine, Ser: 1.11 mg/dL (ref 0.76–1.27)
Glucose: 140 mg/dL — ABNORMAL HIGH (ref 70–99)
Potassium: 4.7 mmol/L (ref 3.5–5.2)
Sodium: 138 mmol/L (ref 134–144)
eGFR: 76 mL/min/{1.73_m2} (ref >59)

## 2021-03-27 LAB — TSH: TSH: 0.469 u[IU]/mL (ref 0.450–4.500)

## 2021-03-27 MED ORDER — NEBIVOLOL HCL 20 MG PO TABS: 20.0000 mg | ORAL_TABLET | ORAL | 3 refills | Status: DC

## 2021-03-27 NOTE — Patient Instructions (Signed)
Medication Instructions:  Your physician has recommended you make the following change in your medication:   INCREASE: Bystolic to 20mg  daily  *If you need a refill on your cardiac medications before your next appointment, please call your pharmacy*   Lab Work: TODAY: BMET, CBC, TSH  If you have labs (blood work) drawn today and your tests are completely normal, you will receive your results only by: MyChart Message (if you have MyChart) OR A paper copy in the mail If you have any lab test that is abnormal or we need to change your treatment, we will call you to review the results.   Testing/Procedures: Your physician has requested that you have an echocardiogram. Echocardiography is a painless test that uses sound waves to create images of your heart. It provides your doctor with information about the size and shape of your heart and how well your heart's chambers and valves are working. This procedure takes approximately one hour. There are no restrictions for this procedure.   Follow-Up: At [facility], you and your health needs are our priority.  As part of our continuing mission to provide you with exceptional heart care, we have created designated Provider Care Teams.  These Care Teams include your primary Cardiologist (physician) and Advanced Practice Providers (APPs -  Physician Assistants and Nurse Practitioners) who all work together to provide you with the care you need, when you need it.  We recommend signing up for the patient portal called "MyChart".  Sign up information is provided on this After Visit Summary.  MyChart is used to connect with patients for Virtual Visits (Telemedicine).  Patients are able to view lab/test results, encounter notes, upcoming appointments, etc.  Non-urgent messages can be sent to your provider as well.   To learn more about what you can do with MyChart, go to [facility].    Your next appointment:   As scheduled

## 2021-04-04 ENCOUNTER — Other Ambulatory Visit: Payer: Self-pay | Admitting: Cardiovascular Disease

## 2021-04-04 NOTE — Telephone Encounter (Signed)
Prescription refill request for Eliquis received. Indication:atrial fib Last office visit:9/22 Scr:1.1 Age: 60 Weight:109.3 kg  Prescription refilled

## 2021-04-09 DIAGNOSIS — I10 Essential (primary) hypertension: Secondary | ICD-10-CM | POA: Diagnosis not present

## 2021-04-09 DIAGNOSIS — Z7901 Long term (current) use of anticoagulants: Secondary | ICD-10-CM | POA: Diagnosis not present

## 2021-04-09 DIAGNOSIS — I4821 Permanent atrial fibrillation: Secondary | ICD-10-CM | POA: Diagnosis not present

## 2021-04-09 DIAGNOSIS — E1142 Type 2 diabetes mellitus with diabetic polyneuropathy: Secondary | ICD-10-CM | POA: Diagnosis not present

## 2021-04-16 ENCOUNTER — Ambulatory Visit (HOSPITAL_COMMUNITY): Payer: Federal, State, Local not specified - PPO | Attending: Internal Medicine

## 2021-04-16 ENCOUNTER — Other Ambulatory Visit: Payer: Self-pay

## 2021-04-16 DIAGNOSIS — I4819 Other persistent atrial fibrillation: Secondary | ICD-10-CM

## 2021-04-16 LAB — ECHOCARDIOGRAM COMPLETE: S' Lateral: 4.2 cm

## 2021-04-20 NOTE — H&P (View-Only) (Signed)
PCP:  Gareth Morgan, MD Primary Cardiologist: Thurmon Fair, MD Electrophysiologist: Hillis Range, MD   [name] is a 60 y.o. male seen today for Hillis Range, MD for routine electrophysiology followup.   At last visit Echo ordered to help plan for management of his AF.   Echo showed newly depressed EF of 30-35% compared to previous 50-55% in 03/2014.  With underlying CAD including CABG and discussion with Dr. Royann Shivers, it was felt patient should proceed with ischemic work up.    Since last being seen in our clinic the patient reports about the same. Remains SOB with moderate or more exertion, worse on days he's "holding on to fluid". No chest pain, lightheadedness, or syncope. Chronic bendopnea, mild orthopnea. Remains fatigued. Continues to have brief palpitations more like heart "skipping" than racing or being irregular.   Past Medical History:  Diagnosis Date   Abdominal distention    Anemia    Postoperative   Chronic diastolic CHF (congestive heart failure) (HCC)    Coronary artery disease    a. premature - acute MI age 41 s/p BMS to prox LAD. b. s/p DES to RCA, PLA, mLAD in 2007. c. 4V CABG in 2008 with LIMA to LAD, SVG to first diagonal, SVG to first OM, SVG to PDA   Diabetes (HCC)    Edema    H/O gastroesophageal reflux (GERD)    High cholesterol    Hypertension    Myocardial infarction (HCC)    Obesity    Persistent atrial fibrillation (HCC)    a. on Tikosyn, Xarelto.   Pleural effusion    resolved with therapy   Past Surgical History:  Procedure Laterality Date   ABLATION OF DYSRHYTHMIC FOCUS  09/23/2017   ATRIAL FIBRILLATION ABLATION N/A 09/23/2017   Procedure: ATRIAL FIBRILLATION ABLATION;  Surgeon: Hillis Range, MD;  Location: [facility] INVASIVE CV LAB;  Service: Cardiovascular;  Laterality: N/A;   CARDIAC CATHETERIZATION  2008   CORONARY ANGIOPLASTY  1996   CORONARY ARTERY BYPASS GRAFT  2008   LIMA-LAD, SVG-Dx, SVG-OM1, SVG-PDA   LEFT HEART CATH AND  CORS/GRAFTS ANGIOGRAPHY N/A 09/25/2016   Procedure: Left Heart Cath and Cors/Grafts Angiography;  Surgeon: Corky Crafts, MD;  Location: [facility] INVASIVE CV LAB;  Service: Cardiovascular;  Laterality: N/A;    Current Outpatient Medications  Medication Sig Dispense Refill   acetaminophen (TYLENOL) 325 MG tablet Take 2 tablets (650 mg total) by mouth every 4 (four) hours as needed for headache or mild pain.     apixaban (ELIQUIS) 5 MG TABS tablet TAKE ONE TABLET BY MOUTH TWICE A DAY 60 tablet 5   atorvastatin (LIPITOR) 40 MG tablet Take 40 mg by mouth at bedtime.      dapagliflozin propanediol (FARXIGA) 10 MG TABS tablet Take by mouth daily.     Ergocalciferol (VITAMIN D2 PO) Take by mouth.     ferrous sulfate 325 (65 FE) MG tablet Take 325 mg by mouth daily.     furosemide (LASIX) 40 MG tablet Take 40-80 mg by mouth daily as needed for fluid.      glimepiride (AMARYL) 4 MG tablet Take 4 mg by mouth daily with breakfast.     isosorbide mononitrate (IMDUR) 30 MG 24 hr tablet Take 30 mg by mouth daily.     JANUVIA 100 MG tablet Take 100 mg by mouth daily.      losartan (COZAAR) 50 MG tablet Take 50 mg by mouth daily.     metFORMIN (GLUCOPHAGE) 1000 MG  tablet Take 1 tablet (1,000 mg total) by mouth 2 (two) times daily with a meal.     Multiple Vitamins-Minerals (MULTIVITAMIN WITH MINERALS) tablet Take 1 tablet by mouth 2 (two) times daily.      Nebivolol HCl (BYSTOLIC) 20 MG TABS Take 1 tablet (20 mg total) by mouth every morning. 90 tablet 3   nitroGLYCERIN (NITROSTAT) 0.4 MG SL tablet Place 1 tablet (0.4 mg total) under the tongue every 5 (five) minutes as needed for chest pain. 25 tablet 4   potassium chloride SA (K-DUR,KLOR-CON) 20 MEQ tablet Take 1 tablet (20 mEq total) by mouth daily. 90 tablet 3   No current facility-administered medications for this visit.    No Known Allergies  Social History   Socioeconomic History   Marital status: Married    Spouse name: Not on file   Number  of children: Not on file   Years of education: Not on file   Highest education level: Not on file  Occupational History   Occupation: Sports administrator    Employer: [employer]   Occupation: Has cattle and Programme researcher, broadcasting/film/video.  Tobacco Use   Smoking status: Former    Packs/day: 2.00    Years: 30.00    Pack years: 60.00    Types: Cigarettes   Smokeless tobacco: Never  Vaping Use   Vaping Use: Never used  Substance and Sexual Activity   Alcohol use: Yes    Alcohol/week: 1.0 standard drink    Types: 1 Standard drinks or equivalent per week    Comment: rare   Drug use: No   Sexual activity: Not on file  Other Topics Concern   Not on file  Social History Narrative   Not on file   Social Determinants of Health   Financial Resource Strain: Not on file  Food Insecurity: Not on file  Transportation Needs: Not on file  Physical Activity: Not on file  Stress: Not on file  Social Connections: Not on file  Intimate Partner Violence: Not on file     Review of Systems: All other systems reviewed and are otherwise negative except as noted above.  Physical Exam: Vitals:   04/23/21 1037  BP: 120/86  Pulse: 91  SpO2: 98%  Weight: 248 lb 12.8 oz (112.9 kg)  Height: 6' (1.829 m)     General:  Well appearing. No resp difficulty. HEENT: Normal Neck: Supple. JVP 7-8cm. Carotids 2+ bilat; no bruits. No thyromegaly or nodule noted. Cor: PMI nondisplaced. Irregularly irregular on exam. No M/G/R noted Lungs: CTAB, normal effort. Abdomen: Soft, non-tender, non-distended, no HSM. No bruits or masses. +BS   Extremities: No cyanosis, clubbing, or rash. R and LLE no edema.  Neuro: Alert & orientedx3, cranial nerves grossly intact. moves all 4 extremities w/o difficulty. Affect pleasant   EKG is not ordered.   Additional studies reviewed include: Previous EP and gen cards notes.   Assessment and Plan:  1. Persistent atrial fibrillation S/p ablation 08/2017, previously on Tikosyn. This was  stopped after ablation. He is not interested in another admission for Tikosyn or sotalol at this time. He also was frustrated navigating medications for QT prolongation and would prefer to avoid if possible.  He tolerated amiodarone post CABG briefly. Not great option given young age.  No flecainide with CABG and Chronic systolic CHF Previously failed Multaq with ineffectiveness.  Gradual increase in palpitations recently, my worry now is this may more likely be related to his CHF Continue Bystolic 20 mg daily. This agent  remains preferred by Dr. Royann Shivers.  Remains on eliquis without bleeding or missed doses.  He is interested in re-do ablation. Will need to discuss timing with Dr. Johney Frame.  If he were to end up needing a second bypass (which he is not interested in at this point) should consider MAZE at same time.    2. CAD s/p CABG No overt ischemia, though ? If his recent periods of fatigue and dyspnea are related to new drop in EF With newly depressed EF he is indicated for updated cath with high pre-test probability. Plan for LHC with possibly coronary angioplasty next week.  If he were to end up needing a second bypass (which he is not interested in at this point) should consider MAZE at same time.    3. Chronic systolic CHF NYHA II-III symptoms Continue losartan. Consider Entresto post cath Continue farxiga Continue bystolic for now per Dr. Royann Shivers  Discussed with Dr. Johney Frame and will keep visit 11/7 in place for now. Do not think rate is elevated enough to explain his drop in EF. Labs for cath today.   Graciella Freer, [name]  04/23/21 11:22 AM

## 2021-04-20 NOTE — Progress Notes (Signed)
PCP:  Gareth Morgan, MD Primary Cardiologist: Thurmon Fair, MD Electrophysiologist: Hillis Range, MD   [name] is a 60 y.o. male seen today for Hillis Range, MD for routine electrophysiology followup.   At last visit Echo ordered to help plan for management of his AF.   Echo showed newly depressed EF of 30-35% compared to previous 50-55% in 03/2014.  With underlying CAD including CABG and discussion with Dr. Royann Shivers, it was felt patient should proceed with ischemic work up.    Since last being seen in our clinic the patient reports about the same. Remains SOB with moderate or more exertion, worse on days he's "holding on to fluid". No chest pain, lightheadedness, or syncope. Chronic bendopnea, mild orthopnea. Remains fatigued. Continues to have brief palpitations more like heart "skipping" than racing or being irregular.   Past Medical History:  Diagnosis Date   Abdominal distention    Anemia    Postoperative   Chronic diastolic CHF (congestive heart failure) (HCC)    Coronary artery disease    a. premature - acute MI age 41 s/p BMS to prox LAD. b. s/p DES to RCA, PLA, mLAD in 2007. c. 4V CABG in 2008 with LIMA to LAD, SVG to first diagonal, SVG to first OM, SVG to PDA   Diabetes (HCC)    Edema    H/O gastroesophageal reflux (GERD)    High cholesterol    Hypertension    Myocardial infarction (HCC)    Obesity    Persistent atrial fibrillation (HCC)    a. on Tikosyn, Xarelto.   Pleural effusion    resolved with therapy   Past Surgical History:  Procedure Laterality Date   ABLATION OF DYSRHYTHMIC FOCUS  09/23/2017   ATRIAL FIBRILLATION ABLATION N/A 09/23/2017   Procedure: ATRIAL FIBRILLATION ABLATION;  Surgeon: Hillis Range, MD;  Location: [facility] INVASIVE CV LAB;  Service: Cardiovascular;  Laterality: N/A;   CARDIAC CATHETERIZATION  2008   CORONARY ANGIOPLASTY  1996   CORONARY ARTERY BYPASS GRAFT  2008   LIMA-LAD, SVG-Dx, SVG-OM1, SVG-PDA   LEFT HEART CATH AND  CORS/GRAFTS ANGIOGRAPHY N/A 09/25/2016   Procedure: Left Heart Cath and Cors/Grafts Angiography;  Surgeon: Corky Crafts, MD;  Location: [facility] INVASIVE CV LAB;  Service: Cardiovascular;  Laterality: N/A;    Current Outpatient Medications  Medication Sig Dispense Refill   acetaminophen (TYLENOL) 325 MG tablet Take 2 tablets (650 mg total) by mouth every 4 (four) hours as needed for headache or mild pain.     apixaban (ELIQUIS) 5 MG TABS tablet TAKE ONE TABLET BY MOUTH TWICE A DAY 60 tablet 5   atorvastatin (LIPITOR) 40 MG tablet Take 40 mg by mouth at bedtime.      dapagliflozin propanediol (FARXIGA) 10 MG TABS tablet Take by mouth daily.     Ergocalciferol (VITAMIN D2 PO) Take by mouth.     ferrous sulfate 325 (65 FE) MG tablet Take 325 mg by mouth daily.     furosemide (LASIX) 40 MG tablet Take 40-80 mg by mouth daily as needed for fluid.      glimepiride (AMARYL) 4 MG tablet Take 4 mg by mouth daily with breakfast.     isosorbide mononitrate (IMDUR) 30 MG 24 hr tablet Take 30 mg by mouth daily.     JANUVIA 100 MG tablet Take 100 mg by mouth daily.      losartan (COZAAR) 50 MG tablet Take 50 mg by mouth daily.     metFORMIN (GLUCOPHAGE) 1000 MG  tablet Take 1 tablet (1,000 mg total) by mouth 2 (two) times daily with a meal.     Multiple Vitamins-Minerals (MULTIVITAMIN WITH MINERALS) tablet Take 1 tablet by mouth 2 (two) times daily.      Nebivolol HCl (BYSTOLIC) 20 MG TABS Take 1 tablet (20 mg total) by mouth every morning. 90 tablet 3   nitroGLYCERIN (NITROSTAT) 0.4 MG SL tablet Place 1 tablet (0.4 mg total) under the tongue every 5 (five) minutes as needed for chest pain. 25 tablet 4   potassium chloride SA (K-DUR,KLOR-CON) 20 MEQ tablet Take 1 tablet (20 mEq total) by mouth daily. 90 tablet 3   No current facility-administered medications for this visit.    No Known Allergies  Social History   Socioeconomic History   Marital status: Married    Spouse name: Not on file   Number  of children: Not on file   Years of education: Not on file   Highest education level: Not on file  Occupational History   Occupation: Restaurant owner    Employer: [employer]   Occupation: Has cattle and chickens.  Tobacco Use   Smoking status: Former    Packs/day: 2.00    Years: 30.00    Pack years: 60.00    Types: Cigarettes   Smokeless tobacco: Never  Vaping Use   Vaping Use: Never used  Substance and Sexual Activity   Alcohol use: Yes    Alcohol/week: 1.0 standard drink    Types: 1 Standard drinks or equivalent per week    Comment: rare   Drug use: No   Sexual activity: Not on file  Other Topics Concern   Not on file  Social History Narrative   Not on file   Social Determinants of Health   Financial Resource Strain: Not on file  Food Insecurity: Not on file  Transportation Needs: Not on file  Physical Activity: Not on file  Stress: Not on file  Social Connections: Not on file  Intimate Partner Violence: Not on file     Review of Systems: All other systems reviewed and are otherwise negative except as noted above.  Physical Exam: Vitals:   04/23/21 1037  BP: 120/86  Pulse: 91  SpO2: 98%  Weight: 248 lb 12.8 oz (112.9 kg)  Height: 6' (1.829 m)     General:  Well appearing. No resp difficulty. HEENT: Normal Neck: Supple. JVP 7-8cm. Carotids 2+ bilat; no bruits. No thyromegaly or nodule noted. Cor: PMI nondisplaced. Irregularly irregular on exam. No M/G/R noted Lungs: CTAB, normal effort. Abdomen: Soft, non-tender, non-distended, no HSM. No bruits or masses. +BS   Extremities: No cyanosis, clubbing, or rash. R and LLE no edema.  Neuro: Alert & orientedx3, cranial nerves grossly intact. moves all 4 extremities w/o difficulty. Affect pleasant   EKG is not ordered.   Additional studies reviewed include: Previous EP and gen cards notes.   Assessment and Plan:  1. Persistent atrial fibrillation S/p ablation 08/2017, previously on Tikosyn. This was  stopped after ablation. He is not interested in another admission for Tikosyn or sotalol at this time. He also was frustrated navigating medications for QT prolongation and would prefer to avoid if possible.  He tolerated amiodarone post CABG briefly. Not great option given young age.  No flecainide with CABG and Chronic systolic CHF Previously failed Multaq with ineffectiveness.  Gradual increase in palpitations recently, my worry now is this may more likely be related to his CHF Continue Bystolic 20 mg daily. This agent   remains preferred by Dr. Croitoru.  Remains on eliquis without bleeding or missed doses.  He is interested in re-do ablation. Will need to discuss timing with Dr. Allred.  If he were to end up needing a second bypass (which he is not interested in at this point) should consider MAZE at same time.    2. CAD s/p CABG No overt ischemia, though ? If his recent periods of fatigue and dyspnea are related to new drop in EF With newly depressed EF he is indicated for updated cath with high pre-test probability. Plan for LHC with possibly coronary angioplasty next week.  If he were to end up needing a second bypass (which he is not interested in at this point) should consider MAZE at same time.    3. Chronic systolic CHF NYHA II-III symptoms Continue losartan. Consider Entresto post cath Continue farxiga Continue bystolic for now per Dr. Croitoru  Discussed with Dr. Allred and will keep visit 11/7 in place for now. Do not think rate is elevated enough to explain his drop in EF. Labs for cath today.   Tiffiany Beadles Andrew Jedi Catalfamo, [name]  04/23/21 11:22 AM  

## 2021-04-23 ENCOUNTER — Encounter: Payer: Self-pay | Admitting: Student

## 2021-04-23 ENCOUNTER — Ambulatory Visit: Payer: Federal, State, Local not specified - PPO | Admitting: Student

## 2021-04-23 ENCOUNTER — Other Ambulatory Visit: Payer: Self-pay

## 2021-04-23 VITALS — BP 120/86 | HR 91 | Ht 72.0 in | Wt 248.8 lb

## 2021-04-23 DIAGNOSIS — I4819 Other persistent atrial fibrillation: Secondary | ICD-10-CM

## 2021-04-23 DIAGNOSIS — I5022 Chronic systolic (congestive) heart failure: Secondary | ICD-10-CM | POA: Diagnosis not present

## 2021-04-23 DIAGNOSIS — I25118 Atherosclerotic heart disease of native coronary artery with other forms of angina pectoris: Secondary | ICD-10-CM | POA: Diagnosis not present

## 2021-04-23 LAB — BASIC METABOLIC PANEL
BUN/Creatinine Ratio: 17 (ref 9–20)
BUN: 19 mg/dL (ref 6–24)
CO2: 22 mmol/L (ref 20–29)
Calcium: 10 mg/dL (ref 8.7–10.2)
Chloride: 98 mmol/L (ref 96–106)
Creatinine, Ser: 1.13 mg/dL (ref 0.76–1.27)
Glucose: 202 mg/dL — ABNORMAL HIGH (ref 70–99)
Potassium: 4.8 mmol/L (ref 3.5–5.2)
Sodium: 137 mmol/L (ref 134–144)
eGFR: 75 mL/min/{1.73_m2} (ref >59)

## 2021-04-23 LAB — CBC
Hematocrit: 44.9 % (ref 37.5–51.0)
Hemoglobin: 14.8 g/dL (ref 13.0–17.7)
MCH: 28.6 pg (ref 26.6–33.0)
MCHC: 33 g/dL (ref 31.5–35.7)
MCV: 87 fL (ref 79–97)
Platelets: 242 10*3/uL (ref 150–450)
RBC: 5.18 x10E6/uL (ref 4.14–5.80)
RDW: 14.5 % (ref 11.6–15.4)
WBC: 12.3 10*3/uL — ABNORMAL HIGH (ref 3.4–10.8)

## 2021-04-23 NOTE — Patient Instructions (Signed)
  [facility] CARDIOVASCULAR DIVISION [facility] ST OFFICE [address] [place], [address] [place] [address] Dept: [phone] Loc: [phone]  [name]  04/23/2021  You are scheduled for a Cardiac Catheterization on Tuesday, November 1 with Dr. Lance Muss.  1. Please arrive at the [facility] (Main Entrance A) at [facility]: [address] [place], [place] [address] at 5:30 AM (This time is two hours before your procedure to ensure your preparation). Free valet parking service is available.   Special note: Every effort is made to have your procedure done on time. Please understand that emergencies sometimes delay scheduled procedures.  2. Diet: Do not eat solid foods after midnight.  The patient may have clear liquids until 5am upon the day of the procedure.  3. Labs: You will need to have blood drawn TODAY: BMET, CBC  4. Medication instructions in preparation for your procedure:   Contrast Allergy: No  Stop taking Eliquis (Apixiban) on Saturday, October 29.  Stop taking, Lasix (Furosemide)  Monday, October 31,   Do not take Diabetes Med Glucophage (Metformin) on the day of the procedure and HOLD 48 HOURS AFTER THE PROCEDURE.  On the morning of your procedure, take your Aspirin and any morning medicines NOT listed above.  You may use sips of water.  5. Plan for one night stay--bring personal belongings. 6. Bring a current list of your medications and current insurance cards. 7. You MUST have a responsible person to drive you home. 8. Someone MUST be with you the first 24 hours after you arrive home or your discharge will be delayed. 9. Please wear clothes that are easy to get on and off and wear slip-on shoes.  Thank you for allowing [place] to care for you!   -- [name]

## 2021-04-30 ENCOUNTER — Telehealth: Payer: Self-pay | Admitting: *Deleted

## 2021-04-30 NOTE — Telephone Encounter (Signed)
Cardiac catheterization scheduled at [facility] for: Tuesday May 01, 2021 7:30 AM Arrive [facility] Main Entrance A [facility]) at: 5:30 AM   No solid food after midnight prior to cath, clear liquids until 5 AM day of procedure.  Hold: Eliquis-none 04/29/21 until post procedure-pt reports he has not taken since 04/28/21 Metformin-day of procedure and 48 hours post procedure Januvia-AM of procedure Farxiga-AM of procedure Glimepiride-AM of procedure Lasix/KCl-AM of procedure  Except hold medications usual morning medications can be taken pre-cath with sips of water including aspirin 81 mg.    Confirmed patient has responsible adult to drive home post procedure and be with patient first 24 hours after arriving home.  [facility] does allow one visitor to accompany you and wait in the hospital waiting room while you are there for your procedure. You and your visitor will be asked to wear a mask once you enter the hospital.   Patient reports does not currently have any new symptoms concerning for COVID-19 and no household members with COVID-19 like illness.    Reviewed procedure/mask/visitor instructions with patient.

## 2021-05-01 ENCOUNTER — Encounter (HOSPITAL_COMMUNITY)
Admission: RE | Disposition: A | Discharge status: Home / Self Care | Payer: Self-pay | Source: Home / Self Care | Attending: Interventional Cardiology

## 2021-05-01 ENCOUNTER — Other Ambulatory Visit: Payer: Self-pay

## 2021-05-01 ENCOUNTER — Ambulatory Visit (HOSPITAL_COMMUNITY)
Admission: RE | Admit: 2021-05-01 | Discharge: 2021-05-01 | Disposition: A | Discharge status: Home / Self Care | Payer: Federal, State, Local not specified - PPO | Attending: Interventional Cardiology | Admitting: Interventional Cardiology

## 2021-05-01 ENCOUNTER — Other Ambulatory Visit (HOSPITAL_COMMUNITY): Payer: Self-pay

## 2021-05-01 DIAGNOSIS — I5042 Chronic combined systolic (congestive) and diastolic (congestive) heart failure: Secondary | ICD-10-CM | POA: Diagnosis not present

## 2021-05-01 DIAGNOSIS — K219 Gastro-esophageal reflux disease without esophagitis: Secondary | ICD-10-CM | POA: Diagnosis present

## 2021-05-01 DIAGNOSIS — I252 Old myocardial infarction: Secondary | ICD-10-CM | POA: Insufficient documentation

## 2021-05-01 DIAGNOSIS — Z955 Presence of coronary angioplasty implant and graft: Secondary | ICD-10-CM | POA: Insufficient documentation

## 2021-05-01 DIAGNOSIS — I110 Hypertensive heart disease with heart failure: Secondary | ICD-10-CM | POA: Diagnosis not present

## 2021-05-01 DIAGNOSIS — E785 Hyperlipidemia, unspecified: Secondary | ICD-10-CM | POA: Diagnosis present

## 2021-05-01 DIAGNOSIS — Z87891 Personal history of nicotine dependence: Secondary | ICD-10-CM | POA: Insufficient documentation

## 2021-05-01 DIAGNOSIS — Z7901 Long term (current) use of anticoagulants: Secondary | ICD-10-CM | POA: Insufficient documentation

## 2021-05-01 DIAGNOSIS — I2510 Atherosclerotic heart disease of native coronary artery without angina pectoris: Secondary | ICD-10-CM | POA: Diagnosis not present

## 2021-05-01 DIAGNOSIS — E7800 Pure hypercholesterolemia, unspecified: Secondary | ICD-10-CM | POA: Insufficient documentation

## 2021-05-01 DIAGNOSIS — E119 Type 2 diabetes mellitus without complications: Secondary | ICD-10-CM

## 2021-05-01 DIAGNOSIS — I25810 Atherosclerosis of coronary artery bypass graft(s) without angina pectoris: Secondary | ICD-10-CM | POA: Diagnosis not present

## 2021-05-01 DIAGNOSIS — R079 Chest pain, unspecified: Secondary | ICD-10-CM

## 2021-05-01 DIAGNOSIS — Z79899 Other long term (current) drug therapy: Secondary | ICD-10-CM | POA: Diagnosis not present

## 2021-05-01 DIAGNOSIS — Z951 Presence of aortocoronary bypass graft: Secondary | ICD-10-CM

## 2021-05-01 DIAGNOSIS — I4819 Other persistent atrial fibrillation: Secondary | ICD-10-CM | POA: Diagnosis not present

## 2021-05-01 DIAGNOSIS — R931 Abnormal findings on diagnostic imaging of heart and coronary circulation: Secondary | ICD-10-CM

## 2021-05-01 DIAGNOSIS — Z7984 Long term (current) use of oral hypoglycemic drugs: Secondary | ICD-10-CM | POA: Diagnosis not present

## 2021-05-01 DIAGNOSIS — I10 Essential (primary) hypertension: Secondary | ICD-10-CM | POA: Diagnosis present

## 2021-05-01 HISTORY — PX: CORONARY STENT INTERVENTION: CATH118234

## 2021-05-01 HISTORY — PX: LEFT HEART CATH AND CORS/GRAFTS ANGIOGRAPHY: CATH118250

## 2021-05-01 LAB — POCT ACTIVATED CLOTTING TIME
Activated Clotting Time: 196 seconds
Activated Clotting Time: 260 seconds

## 2021-05-01 LAB — GLUCOSE, CAPILLARY
Glucose-Capillary: 216 mg/dL — ABNORMAL HIGH (ref 70–99)
Glucose-Capillary: 171 mg/dL — ABNORMAL HIGH (ref 70–99)

## 2021-05-01 LAB — POCT I-STAT, CHEM 8
BUN: 19 mg/dL (ref 6–20)
Calcium, Ion: 1.27 mmol/L (ref 1.15–1.40)
Chloride: 105 mmol/L (ref 98–111)
Creatinine, Ser: 0.8 mg/dL (ref 0.61–1.24)
Glucose, Bld: 206 mg/dL — ABNORMAL HIGH (ref 70–99)
HCT: 38 % — ABNORMAL LOW (ref 39.0–52.0)
Hemoglobin: 12.9 g/dL — ABNORMAL LOW (ref 13.0–17.0)
Potassium: 4.3 mmol/L (ref 3.5–5.1)
Sodium: 139 mmol/L (ref 135–145)
TCO2: 23 mmol/L (ref 22–32)

## 2021-05-01 SURGERY — LEFT HEART CATH AND CORS/GRAFTS ANGIOGRAPHY
Anesthesia: LOCAL

## 2021-05-01 MED ORDER — SODIUM CHLORIDE 0.9 % IV SOLN: 250.0000 mL | INTRAVENOUS | Status: DC | PRN

## 2021-05-01 MED ORDER — CLOPIDOGREL BISULFATE 300 MG PO TABS
ORAL_TABLET | ORAL | Status: AC
  Filled 2021-05-01: qty 1

## 2021-05-01 MED ORDER — SODIUM CHLORIDE 0.9% FLUSH: 3.0000 mL | INTRAVENOUS | Status: DC | PRN

## 2021-05-01 MED ORDER — HEPARIN SODIUM (PORCINE) 1000 UNIT/ML IJ SOLN
INTRAMUSCULAR | Status: DC | PRN
  Administered 2021-05-01: 11000 [IU] via INTRAVENOUS
  Administered 2021-05-01: 3000 [IU] via INTRAVENOUS

## 2021-05-01 MED ORDER — MIDAZOLAM HCL 2 MG/2ML IJ SOLN
INTRAMUSCULAR | Status: AC
  Filled 2021-05-01: qty 2

## 2021-05-01 MED ORDER — LIDOCAINE HCL (PF) 1 % IJ SOLN
INTRAMUSCULAR | Status: AC
  Filled 2021-05-01: qty 30

## 2021-05-01 MED ORDER — CLOPIDOGREL BISULFATE 75 MG PO TABS
75.0000 mg | ORAL_TABLET | Freq: Every day | ORAL | 5 refills | Status: DC
  Filled 2021-05-01: qty 30, 30d supply, fill #0

## 2021-05-01 MED ORDER — SODIUM CHLORIDE 0.9% FLUSH: 3.0000 mL | Freq: Two times a day (BID) | INTRAVENOUS | Status: DC

## 2021-05-01 MED ORDER — HEPARIN (PORCINE) IN NACL 1000-0.9 UT/500ML-% IV SOLN
INTRAVENOUS | Status: DC | PRN
  Administered 2021-05-01 (×2): 500 mL

## 2021-05-01 MED ORDER — HEPARIN SODIUM (PORCINE) 1000 UNIT/ML IJ SOLN
INTRAMUSCULAR | Status: AC
  Filled 2021-05-01: qty 1

## 2021-05-01 MED ORDER — CLOPIDOGREL BISULFATE 300 MG PO TABS
ORAL_TABLET | ORAL | Status: DC | PRN
  Administered 2021-05-01: 600 mg via ORAL

## 2021-05-01 MED ORDER — SODIUM CHLORIDE 0.9 % IV SOLN: INTRAVENOUS | Status: AC

## 2021-05-01 MED ORDER — HEPARIN (PORCINE) IN NACL 1000-0.9 UT/500ML-% IV SOLN
INTRAVENOUS | Status: AC
  Filled 2021-05-01: qty 1000

## 2021-05-01 MED ORDER — IOHEXOL 350 MG/ML SOLN
INTRAVENOUS | Status: DC | PRN
  Administered 2021-05-01: 135 mL

## 2021-05-01 MED ORDER — MIDAZOLAM HCL 2 MG/2ML IJ SOLN
INTRAMUSCULAR | Status: DC | PRN
  Administered 2021-05-01: 1 mg via INTRAVENOUS
  Administered 2021-05-01: 2 mg via INTRAVENOUS

## 2021-05-01 MED ORDER — SODIUM CHLORIDE 0.9 % IV SOLN: INTRAVENOUS | Status: DC

## 2021-05-01 MED ORDER — ASPIRIN 81 MG PO CHEW: 81.0000 mg | CHEWABLE_TABLET | ORAL | Status: DC

## 2021-05-01 MED ORDER — LABETALOL HCL 5 MG/ML IV SOLN: 10.0000 mg | INTRAVENOUS | Status: AC | PRN

## 2021-05-01 MED ORDER — LIDOCAINE HCL (PF) 1 % IJ SOLN
INTRAMUSCULAR | Status: DC | PRN
  Administered 2021-05-01: 20 mL

## 2021-05-01 MED ORDER — FENTANYL CITRATE (PF) 100 MCG/2ML IJ SOLN
INTRAMUSCULAR | Status: AC
  Filled 2021-05-01: qty 2

## 2021-05-01 MED ORDER — ONDANSETRON HCL 4 MG/2ML IJ SOLN: 4.0000 mg | Freq: Four times a day (QID) | INTRAMUSCULAR | Status: DC | PRN

## 2021-05-01 MED ORDER — FENTANYL CITRATE (PF) 100 MCG/2ML IJ SOLN
INTRAMUSCULAR | Status: DC | PRN
  Administered 2021-05-01 (×3): 25 ug via INTRAVENOUS

## 2021-05-01 MED ORDER — NITROGLYCERIN 1 MG/10 ML FOR IR/CATH LAB
INTRA_ARTERIAL | Status: AC
  Filled 2021-05-01: qty 10

## 2021-05-01 MED ORDER — ACETAMINOPHEN 325 MG PO TABS: 650.0000 mg | ORAL_TABLET | ORAL | Status: DC | PRN

## 2021-05-01 MED ORDER — HYDRALAZINE HCL 20 MG/ML IJ SOLN: 10.0000 mg | INTRAMUSCULAR | Status: AC | PRN

## 2021-05-01 MED ORDER — CLOPIDOGREL BISULFATE 75 MG PO TABS: 75.0000 mg | ORAL_TABLET | Freq: Every day | ORAL | Status: DC

## 2021-05-01 SURGICAL SUPPLY — 21 items
BALLN SAPPHIRE ~~LOC~~ 4.0X8 (BALLOONS) ×2 IMPLANT
CATH INFINITI 5 FR IM (CATHETERS) ×2 IMPLANT
CATH INFINITI 5 FR RCB (CATHETERS) ×2 IMPLANT
CATH INFINITI 5FR JL5 (CATHETERS) ×2 IMPLANT
CATH INFINITI JR4 5F (CATHETERS) ×2 IMPLANT
CATH LAUNCHER 6FR AL1 (CATHETERS) ×1 IMPLANT
CATH LAUNCHER 6FR JR4 (CATHETERS) ×2 IMPLANT
CATHETER LAUNCHER 6FR AL1 (CATHETERS) ×2
CLOSURE MYNX CONTROL 6F/7F (Vascular Products) ×2 IMPLANT
ELECT DEFIB PAD ADLT CADENCE (PAD) ×2 IMPLANT
KIT ENCORE 26 ADVANTAGE (KITS) ×2 IMPLANT
KIT HEART LEFT (KITS) ×2 IMPLANT
KIT HEMO VALVE WATCHDOG (MISCELLANEOUS) ×2 IMPLANT
PACK CARDIAC CATHETERIZATION (CUSTOM PROCEDURE TRAY) ×2 IMPLANT
SHEATH PINNACLE 5F 10CM (SHEATH) ×2 IMPLANT
SHEATH PINNACLE 6F 10CM (SHEATH) ×2 IMPLANT
STENT ONYX FRONTIER 3.5X15 (Permanent Stent) ×2 IMPLANT
TRANSDUCER W/STOPCOCK (MISCELLANEOUS) ×2 IMPLANT
TUBING CIL FLEX 10 FLL-RA (TUBING) ×2 IMPLANT
WIRE ASAHI PROWATER 180CM (WIRE) ×2 IMPLANT
WIRE EMERALD 3MM-J .035X150CM (WIRE) ×2 IMPLANT

## 2021-05-01 NOTE — Interval H&P Note (Signed)
Cath Lab Visit (complete for each Cath Lab visit)  Clinical Evaluation Leading to the Procedure:   ACS: No.  Non-ACS:    Anginal Classification: CCS II  Anti-ischemic medical therapy: Minimal Therapy (1 class of medications)  Non-Invasive Test Results: High-risk stress test findings: cardiac mortality >3%/year  Prior CABG: Prior CABG  Decreased EF.  Increased AFib.  Plan for cath.      History and Physical Interval Note:  05/01/2021 7:28 AM  [name]  has presented today for surgery, with the diagnosis of heart failure - cad.  The various methods of treatment have been discussed with the patient and family. After consideration of risks, benefits and other options for treatment, the patient has consented to  Procedure(s): LEFT HEART CATH AND CORS/GRAFTS ANGIOGRAPHY (N/A) as a surgical intervention.  The patient's history has been reviewed, patient examined, no change in status, stable for surgery.  I have reviewed the patient's chart and labs.  Questions were answered to the patient's satisfaction.     Lance Muss

## 2021-05-01 NOTE — Progress Notes (Signed)
Discussed stent, Plavix, restrictions, diet, exercise (light until next stent), NTG, daily wts, and CRPII. Pt and wife receptive. Sts he previously had gum bleeding with plavix. Will place CRPII order however pt needs staged PCI. To AP. He prefers to exercise on his own. [id number] Ethelda Chick CES, ACSM 11:38 AM 05/01/2021

## 2021-05-01 NOTE — Discharge Summary (Signed)
Discharge Summary for Same Day PCI   Patient ID: [name] MRN: [id number]; DOB: 10-08-1960  Admit date: 05/01/2021 Discharge date: 05/01/2021  Primary Care Provider: Lemmie Evens, MD  Primary Cardiologist: Sanda Klein, MD  Primary Electrophysiologist:  Thompson Grayer, MD   Discharge Diagnoses    Principal Problem:   Chest pain of uncertain etiology Active Problems:   GASTROESOPHAGEAL REFLUX DISEASE   Persistent atrial fibrillation ([place])   Hx of CABG   Essential hypertension   Dyslipidemia, goal LDL below 70   Non-insulin dependent type 2 diabetes mellitus ([place])   Chronic combined systolic and diastolic CHF (congestive heart failure) ([facility])   Abnormal echocardiogram   CAD (coronary artery disease)    Diagnostic Studies/Procedures    Cardiac Catheterization 05/01/2021:   Origin to Prox Graft lesion is 25% stenosed.   RPDA lesion is 80% stenosed.  SVG to PDA is patent.   Mid LAD lesion is 90% stenosed at large diagonal.  LIMA to LAD is patent.  SVG to diagonal is patent.   Ost LM to Mid LM lesion is 95% stenosed.   Ost Cx to Prox Cx lesion is 80% stenosed.   Prox RCA lesion is 50% stenosed.   RPAV lesion is 70% stenosed. Relatively small territory supplied.   2nd Mrg lesion is 100% stenosed.  SVG to OM2 Origin to Prox Graft lesion is 75% stenosed.   A drug-eluting stent was successfully placed in the SVG using a STENT ONYX FRONTIER 3.5X15, postdilated to 4.0 mm proximally.   Post intervention, there is a 0% residual stenosis.   There is mild left ventricular systolic dysfunction.   LV end diastolic pressure is mildly elevated.   The left ventricular ejection fraction is 35-45% by visual estimate.   There is no aortic valve stenosis.   Successful PCI of the SVG to OM.  This OM, on previous cath films, connected to the remainder of the circumflex and filled it retrograde.  Now the ostium of the OM is occluded and there is no retrograde filling of the circumflex.  His  left main disease has progressed as well.   Plan for PCI of the left main and proximal circumflex.  This will likely require atherectomy or some other calcium modifying technique.  Could use right radial approach as we would not need to engage any bypass grafts.   Restart Eliquis tomorrow.  The patient was loaded with clopidogrel today.  To minimize bleeding risk, will hold off on giving him aspirin at this time.   Plan for same-day discharge if there are no bleeding issues.  Diagnostic Dominance: Right      Intervention   _____________   History of Present Illness     [name] is a 60 y.o. male with a history of CAD s/p CABG x4, chronic combined CHF with new drop in EF to 30-35% on recent Echo on 04/16/2021, persistent atrial fibrillation on Eliquis, hypertension, hyperlipidemia, and type 2 diabetes mellitus who is followed by Dr. Sallyanne Kuster and Dr. Rayann Heman. Patient was seen by [name], [name], on 03/27/2021 at which time he reported decreased energy level and decreased exercise capacity as well as occasional tachypalpitations. Bystolic was increased and repeat Echo was ordered for further evaluation. Echo showed newly reduced EF of 30-35% (down from 50-55%) and global hypokinesis. He was seen back by [name], [name], on 04/23/2021 at which time he reported continued shortness of breath with moderate exertion as well as chronic bendopnea and mild orthopnea. He continued  to deny chest pain. Therefore, outpatient cardiac catheterization was ordered for further evaluation.  Hospital Course     Patient presented to [facility] on 05/01/2021 for planned cardiac catheterization as above. Cardiac cath showed 75% stenosis of SVG to OM2. Other grafts are patent with only 25% stenosis of proximal SVG to PDA graft. Also had 95% stenosis of ostial to mid left main and 80% stenosis of ostial to proximal LCX. Patient underwent successful PCI with DES to SVG to OM lesion. Plan is for Plavix 75mg   daily for 6 months. No plans for Aspirin given patient is also on Eliquis. Eliquis 5mg  twice daily can be resumed on 05/02/2021. The patient was seen by Cardiac Rehab while in short stay. There were no observed complications post cath. Femoral approach was used so Mynx device was deployed for hemostasis. Right femoral cath site was re-evaluated prior to discharge and found to be stable without any complications. Instructions/precautions regarding cath site care were given prior to discharge.  Of note, plan is to likely bring patient back for PCI of the left main and proximal LCX which will likely require atherectomy or some other calcium modifying technique. Dr. Irish Lack will review with team and then cath lab will be in touch with patient.  [name] was seen by Dr. Irish Lack and determined stable for discharge home. Follow up with our office has been arranged. Medications are listed below. Pertinent changes include addition of Plavix. _____________  Cath/PCI Registry Performance & Quality Measures: Aspirin prescribed? - No - also on Eliquis ADP Receptor Inhibitor (Plavix/Clopidogrel, Brilinta/Ticagrelor or Effient/Prasugrel) prescribed (includes medically managed patients)? - Yes High Intensity Statin (Lipitor 40-80mg  or Crestor 20-40mg ) prescribed? - Yes For EF <40%, was ACEI/ARB prescribed? - Yes For EF <40%, Aldosterone Antagonist (Spironolactone or Eplerenone) prescribed? - No - Reason:  Can consider adding at outpatient follow-up. Cardiac Rehab Phase II ordered (Included Medically managed Patients)? - Yes  _____________   Discharge Vitals Blood pressure (!) 145/98, pulse 95, temperature 97.8 F (36.6 C), temperature source Oral, resp. rate 16, height 6' (1.829 m), weight 107 kg, SpO2 98 %.  Filed Weights   05/01/21 0554  Weight: 107 kg    Last Labs & Radiologic Studies    CBC No results for input(s): WBC, NEUTROABS, HGB, HCT, MCV, PLT in the last 72 hours. Basic Metabolic  Panel No results for input(s): NA, K, CL, CO2, GLUCOSE, BUN, CREATININE, CALCIUM, MG, PHOS in the last 72 hours. Liver Function Tests No results for input(s): AST, ALT, ALKPHOS, BILITOT, PROT, ALBUMIN in the last 72 hours. No results for input(s): LIPASE, AMYLASE in the last 72 hours. High Sensitivity Troponin:   No results for input(s): TROPONINIHS in the last 720 hours.  BNP Invalid input(s): POCBNP D-Dimer No results for input(s): DDIMER in the last 72 hours. Hemoglobin A1C No results for input(s): HGBA1C in the last 72 hours. Fasting Lipid Panel No results for input(s): CHOL, HDL, LDLCALC, TRIG, CHOLHDL, LDLDIRECT in the last 72 hours. Thyroid Function Tests No results for input(s): TSH, T4TOTAL, T3FREE, THYROIDAB in the last 72 hours.  Invalid input(s): FREET3 _____________  CARDIAC CATHETERIZATION  Result Date: 05/01/2021   Origin to Prox Graft lesion is 25% stenosed.   RPDA lesion is 80% stenosed.  SVG to PDA is patent.   Mid LAD lesion is 90% stenosed at large diagonal.  LIMA to LAD is patent.  SVG to diagonal is patent.   Ost LM to Mid LM lesion is 95% stenosed.  Ost Cx to Prox Cx lesion is 80% stenosed.   Prox RCA lesion is 50% stenosed.   RPAV lesion is 70% stenosed. Relatively small territory supplied.   2nd Mrg lesion is 100% stenosed.  SVG to OM2 Origin to Prox Graft lesion is 75% stenosed.   A drug-eluting stent was successfully placed in the SVG using a STENT ONYX FRONTIER 3.5X15, postdilated to 4.0 mm proximally.   Post intervention, there is a 0% residual stenosis.   There is mild left ventricular systolic dysfunction.   LV end diastolic pressure is mildly elevated.   The left ventricular ejection fraction is 35-45% by visual estimate.   There is no aortic valve stenosis. Successful PCI of the SVG to OM.  This OM, on previous cath films, connected to the remainder of the circumflex and filled it retrograde.  Now the ostium of the OM is occluded and there is no retrograde  filling of the circumflex.  His left main disease has progressed as well. Plan for PCI of the left main and proximal circumflex.  This will likely require atherectomy or some other calcium modifying technique.  Could use right radial approach as we would not need to engage any bypass grafts. Restart Eliquis tomorrow.  The patient was loaded with clopidogrel today.  To minimize bleeding risk, will hold off on giving him aspirin at this time. Plan for same-day discharge if there are no bleeding issues.   ECHOCARDIOGRAM COMPLETE  Result Date: 04/16/2021    ECHOCARDIOGRAM REPORT   Patient Name:   [name] Date of Exam: 04/16/2021 Medical Rec #:  [id number]     Height:       72.0 in Accession #:    [id number]    Weight:       241.0 lb Date of Birth:  July 13, 1960     BSA:          2.306 m Patient Age:    60 years      BP:           128/70 mmHg Patient Gender: M             HR:           86 bpm. Exam Location:  [address] Procedure: 2D Echo, Cardiac Doppler and Color Doppler Indications:    I48.91* Unspecified atrial fibrillation  History:        Patient has prior history of Echocardiogram examinations, most                 recent 04/04/2014. CHF, Prior CABG, Signs/Symptoms:Chest Pain;                 Risk Factors:Hypertension, Diabetes and Dyslipidemia. Anemia.                 Obesity.  Sonographer:    Cathie Beams RCS Referring Phys: [id number] MICHAEL ANDREW TILLERY IMPRESSIONS  1. Left ventricular ejection fraction, by estimation, is 30 to 35%. The left ventricle has moderately decreased function. The left ventricle demonstrates global hypokinesis. There is moderate asymmetric left ventricular hypertrophy of the posterior-lateral segment. Left ventricular diastolic parameters are indeterminate.  2. Right ventricular systolic function is moderately reduced. The right ventricular size is normal.  3. Left atrial size was mild to moderately dilated.  4. Right atrial size was mildly dilated.  5. The mitral valve is  grossly normal. Trivial mitral valve regurgitation. No evidence of mitral stenosis.  6. The aortic valve is abnormal. There is moderate calcification  of the aortic valve. Aortic valve regurgitation is trivial. Mild to moderate aortic valve sclerosis/calcification is present, without any evidence of aortic stenosis.  7. The inferior vena cava is normal in size with <50% respiratory variability, suggesting right atrial pressure of 8 mmHg. FINDINGS  Left Ventricle: Left ventricular ejection fraction, by estimation, is 30 to 35%. The left ventricle has moderately decreased function. The left ventricle demonstrates global hypokinesis. The left ventricular internal cavity size was normal in size. There is moderate asymmetric left ventricular hypertrophy of the posterior-lateral segment. Left ventricular diastolic parameters are indeterminate. Right Ventricle: The right ventricular size is normal. Right vetricular wall thickness was not well visualized. Right ventricular systolic function is moderately reduced. Left Atrium: Left atrial size was mild to moderately dilated. Right Atrium: Right atrial size was mildly dilated. Pericardium: There is no evidence of pericardial effusion. Mitral Valve: The mitral valve is grossly normal. Trivial mitral valve regurgitation. No evidence of mitral valve stenosis. Tricuspid Valve: The tricuspid valve is normal in structure. Tricuspid valve regurgitation is trivial. No evidence of tricuspid stenosis. Aortic Valve: The aortic valve is abnormal. There is moderate calcification of the aortic valve. Aortic valve regurgitation is trivial. Mild to moderate aortic valve sclerosis/calcification is present, without any evidence of aortic stenosis. Pulmonic Valve: The pulmonic valve was normal in structure. Pulmonic valve regurgitation is trivial. No evidence of pulmonic stenosis. Aorta: The aortic root is normal in size and structure. Venous: The inferior vena cava is normal in size with less  than 50% respiratory variability, suggesting right atrial pressure of 8 mmHg. IAS/Shunts: The interatrial septum appears to be lipomatous. No atrial level shunt detected by color flow Doppler.  LEFT VENTRICLE PLAX 2D LVIDd:         4.70 cm LVIDs:         4.20 cm LV PW:         1.50 cm LV IVS:        0.90 cm LVOT diam:     2.25 cm LV SV:         45 LV SV Index:   19 LVOT Area:     3.98 cm  RIGHT VENTRICLE RV Basal diam:  3.80 cm RV S prime:     8.12 cm/s TAPSE (M-mode): 0.7 cm LEFT ATRIUM           Index        RIGHT ATRIUM           Index LA diam:      5.70 cm 2.47 cm/m   RA Area:     23.60 cm LA Vol (A2C): 78.1 ml 33.86 ml/m  RA Volume:   64.30 ml  27.88 ml/m LA Vol (A4C): 93.6 ml 40.58 ml/m  AORTIC VALVE LVOT Vmax:   64.50 cm/s LVOT Vmean:  39.367 cm/s LVOT VTI:    0.112 m  AORTA Ao Root diam: 3.70 cm  SHUNTS Systemic VTI:  0.11 m Systemic Diam: 2.25 cm Cherlynn Kaiser MD Electronically signed by Cherlynn Kaiser MD Signature Date/Time: 04/16/2021/11:46:49 AM    Final     Disposition   Patient is being discharged home today in good condition.  Follow-up Plans & Appointments     Follow-up Information     Thompson Grayer, MD Follow up.   Specialty: Cardiology Why: Please keep follow-up visit with Dr. Rayann Heman on 07/07/2020 at 1:45pm. Contact information: [place] [address] [phone]  Discharge Instructions     AMB Referral to Cardiac Rehabilitation - Phase II   Complete by: As directed    Diagnosis: Coronary Stents   After initial evaluation and assessments completed: Virtual Based Care may be provided alone or in conjunction with Phase 2 Cardiac Rehab based on patient barriers.: Yes        Discharge Medications   Allergies as of 05/01/2021   No Known Allergies      Medication List     TAKE these medications    acetaminophen 325 MG tablet Commonly known as: TYLENOL Take 2 tablets (650 mg total) by mouth every 4 (four)  hours as needed for headache or mild pain.   atorvastatin 40 MG tablet Commonly known as: LIPITOR Take 40 mg by mouth at bedtime.   clopidogrel 75 MG tablet Commonly known as: PLAVIX Take 1 tablet (75 mg total) by mouth daily with breakfast. Start taking on: May 02, 2021   dapagliflozin propanediol 10 MG Tabs tablet Commonly known as: FARXIGA Take 10 mg by mouth daily.   Eliquis 5 MG Tabs tablet Generic drug: apixaban TAKE ONE TABLET BY MOUTH TWICE A DAY   ferrous sulfate 325 (65 FE) MG tablet Take 325 mg by mouth daily.   furosemide 40 MG tablet Commonly known as: LASIX Take 40 mg by mouth daily.   glimepiride 4 MG tablet Commonly known as: AMARYL Take 4 mg by mouth daily with breakfast.   isosorbide mononitrate 30 MG 24 hr tablet Commonly known as: IMDUR Take 30 mg by mouth daily.   Januvia 100 MG tablet Generic drug: sitaGLIPtin Take 100 mg by mouth daily.   losartan 50 MG tablet Commonly known as: COZAAR Take 50 mg by mouth daily.   metFORMIN 1000 MG tablet Commonly known as: GLUCOPHAGE Take 1 tablet (1,000 mg total) by mouth 2 (two) times daily with a meal.   multivitamin with minerals tablet Take 1 tablet by mouth 2 (two) times daily.   Nebivolol HCl 20 MG Tabs Commonly known as: Bystolic Take 1 tablet (20 mg total) by mouth every morning.   nitroGLYCERIN 0.4 MG SL tablet Commonly known as: NITROSTAT Place 1 tablet (0.4 mg total) under the tongue every 5 (five) minutes as needed for chest pain.   potassium chloride SA 20 MEQ tablet Commonly known as: KLOR-CON Take 1 tablet (20 mEq total) by mouth daily.   VITAMIN D2 PO Take 1 tablet by mouth daily.           Allergies No Known Allergies  Outstanding Labs/Studies   N/A  Duration of Discharge Encounter   Greater than 30 minutes including physician time.  Signed, Darreld Mclean, [name] 05/01/2021, 5:14 PM

## 2021-05-02 ENCOUNTER — Encounter (HOSPITAL_COMMUNITY): Payer: Self-pay | Admitting: Interventional Cardiology

## 2021-05-07 ENCOUNTER — Telehealth: Payer: Self-pay | Admitting: Interventional Cardiology

## 2021-05-07 ENCOUNTER — Other Ambulatory Visit: Payer: Self-pay

## 2021-05-07 ENCOUNTER — Ambulatory Visit: Payer: Federal, State, Local not specified - PPO | Admitting: Internal Medicine

## 2021-05-07 ENCOUNTER — Encounter: Payer: Self-pay | Admitting: Internal Medicine

## 2021-05-07 VITALS — BP 128/70 | HR 94 | Ht 72.0 in | Wt 233.0 lb

## 2021-05-07 DIAGNOSIS — I2510 Atherosclerotic heart disease of native coronary artery without angina pectoris: Secondary | ICD-10-CM

## 2021-05-07 DIAGNOSIS — I5032 Chronic diastolic (congestive) heart failure: Secondary | ICD-10-CM

## 2021-05-07 DIAGNOSIS — I10 Essential (primary) hypertension: Secondary | ICD-10-CM

## 2021-05-07 DIAGNOSIS — I4819 Other persistent atrial fibrillation: Secondary | ICD-10-CM

## 2021-05-07 NOTE — Progress Notes (Signed)
PCP: Gareth Morgan, MD Primary Cardiologist: Dr Royann Shivers Primary EP: Dr Johney Frame  [name] is a 60 y.o. male who presents today for routine electrophysiology followup.  Since last being seen in our clinic, the patient reports doing reasonably well. He has been intermittently in AF for the past year and more persistently over the past few months.  + SOB and fatigue.   He has EF 30% which is new.  He was evaluated and recently underwent cath.  He had PCI and is planned for further PCI on 05/16/21.  Past Medical History:  Diagnosis Date   Abdominal distention    Anemia    Postoperative   Chronic diastolic CHF (congestive heart failure) (HCC)    Coronary artery disease    a. premature - acute MI age 54 s/p BMS to prox LAD. b. s/p DES to RCA, PLA, mLAD in 2007. c. 4V CABG in 2008 with LIMA to LAD, SVG to first diagonal, SVG to first OM, SVG to PDA   Diabetes (HCC)    Edema    H/O gastroesophageal reflux (GERD)    High cholesterol    Hypertension    Myocardial infarction (HCC)    Obesity    Persistent atrial fibrillation (HCC)    a. on Tikosyn, Xarelto.   Pleural effusion    resolved with therapy   Past Surgical History:  Procedure Laterality Date   ABLATION OF DYSRHYTHMIC FOCUS  09/23/2017   ATRIAL FIBRILLATION ABLATION N/A 09/23/2017   Procedure: ATRIAL FIBRILLATION ABLATION;  Surgeon: Hillis Range, MD;  Location: [facility];  Service: Cardiovascular;  Laterality: N/A;   CARDIAC CATHETERIZATION  2008   CORONARY ANGIOPLASTY  1996   CORONARY ARTERY BYPASS GRAFT  2008   LIMA-LAD, SVG-Dx, SVG-OM1, SVG-PDA   CORONARY STENT INTERVENTION N/A 05/01/2021   Procedure: CORONARY STENT INTERVENTION;  Surgeon: Corky Crafts, MD;  Location: [facility];  Service: Cardiovascular;  Laterality: N/A;   LEFT HEART CATH AND CORS/GRAFTS ANGIOGRAPHY N/A 09/25/2016   Procedure: Left Heart Cath and Cors/Grafts Angiography;  Surgeon: Corky Crafts, MD;  Location: [facility]  INVASIVE CV LAB;  Service: Cardiovascular;  Laterality: N/A;   LEFT HEART CATH AND CORS/GRAFTS ANGIOGRAPHY N/A 05/01/2021   Procedure: LEFT HEART CATH AND CORS/GRAFTS ANGIOGRAPHY;  Surgeon: Corky Crafts, MD;  Location: [facility] INVASIVE CV LAB;  Service: Cardiovascular;  Laterality: N/A;    ROS- all systems are reviewed and negatives except as per HPI above  Current Outpatient Medications  Medication Sig Dispense Refill   apixaban (ELIQUIS) 5 MG TABS tablet TAKE ONE TABLET BY MOUTH TWICE A DAY 60 tablet 5   atorvastatin (LIPITOR) 40 MG tablet Take 40 mg by mouth at bedtime.      clopidogrel (PLAVIX) 75 MG tablet Take 1 tablet (75 mg total) by mouth daily with breakfast. 30 tablet 5   dapagliflozin propanediol (FARXIGA) 10 MG TABS tablet Take 10 mg by mouth daily.     Ergocalciferol (VITAMIN D2 PO) Take 1 tablet by mouth daily.     ferrous sulfate 325 (65 FE) MG tablet Take 325 mg by mouth daily.     furosemide (LASIX) 40 MG tablet Take 40 mg by mouth daily.     glimepiride (AMARYL) 4 MG tablet Take 4 mg by mouth daily with breakfast.     isosorbide mononitrate (IMDUR) 30 MG 24 hr tablet Take 30 mg by mouth daily.     JANUVIA 100 MG tablet Take 100 mg by mouth  daily.      losartan (COZAAR) 50 MG tablet Take 50 mg by mouth daily.     metFORMIN (GLUCOPHAGE) 1000 MG tablet Take 1 tablet (1,000 mg total) by mouth 2 (two) times daily with a meal.     Multiple Vitamins-Minerals (MULTIVITAMIN WITH MINERALS) tablet Take 1 tablet by mouth 2 (two) times daily.      Nebivolol HCl (BYSTOLIC) 20 MG TABS Take 1 tablet (20 mg total) by mouth every morning. 90 tablet 3   nitroGLYCERIN (NITROSTAT) 0.4 MG SL tablet Place 1 tablet (0.4 mg total) under the tongue every 5 (five) minutes as needed for chest pain. 25 tablet 4   potassium chloride SA (K-DUR,KLOR-CON) 20 MEQ tablet Take 1 tablet (20 mEq total) by mouth daily. 90 tablet 3   acetaminophen (TYLENOL) 325 MG tablet Take 2 tablets (650 mg total) by mouth  every 4 (four) hours as needed for headache or mild pain. (Patient not taking: No sig reported)     No current facility-administered medications for this visit.    Physical Exam: Vitals:   05/07/21 1341  BP: 128/70  Pulse: 94  SpO2: 97%  Weight: 233 lb (105.7 kg)  Height: 6' (1.829 m)    GEN- The patient is well appearing, alert and oriented x 3 today.   Head- normocephalic, atraumatic Eyes-  Sclera clear, conjunctiva pink Ears- hearing intact Oropharynx- clear Lungs- Clear to ausculation bilaterally, normal work of breathing Heart- iRRR GI- soft, NT, ND, + BS Extremities- no clubbing, cyanosis, or edema  Wt Readings from Last 3 Encounters:  05/07/21 233 lb (105.7 kg)  05/01/21 236 lb (107 kg)  04/23/21 248 lb 12.8 oz (112.9 kg)    EKG tracing ordered today is personally reviewed and shows afib  Assessment and Plan:  Persistent afib Afib has returned.  He did well initially post ablation The patient has symptomatic, recurrent  atrial fibrillation.  I suspect that his symptoms and reduced EF are largely due to AF. Chads2vasc score is 3.  he is anticoagulated with eliquis . Therapeutic strategies for afib including medicine  (tikosyn, amiodarone) and ablation were discussed in detail with the patient today. Risk, benefits, and alternatives to each approach were discussed.  Given that he will have anticoagulation interrupted and PCI next week, I would advise that we defer ablation.  I have therefore advised tikosyn for at least a 3 month bridge while he recovers from PCI. We will need TEE prior to tikosyn given that he will hold [facility] for upcoming PCI.  I would advise [facility] at the same time as his TEE.  2. HTN Stable No change required today  3. Overweight Body mass index is 31.6 kg/m. Lifestyle modification advised  4. CAD Recent cath reviewed Plans for PCI noted.  We will defer ablation and proceed with tikosyn load in 2-3 weeks as above  5. Chronic  systolic Stable No change required today Possibly somewhat tachycardia mediated Hopefully his EF will recover with sinus rhythm   Risks, benefits and potential toxicities for medications prescribed and/or refilled reviewed with patient today.   Eliora Nienhuis MD, FACC 05/07/2021 2:02 PM     

## 2021-05-07 NOTE — Telephone Encounter (Signed)
   I spoke to the patient by phone on November 4.  I discussed the risks and benefits of cardiac cath with this patient, specifically atherectomy.  We will plan on proceeding with atherectomy/stenting of the left main and circumflex.  All of his questions were answered.  He is agreeable and understands the risks.  The patient understands that risks include but are not limited to stroke (1 in 1000), death (1 in 1000), kidney failure [usually temporary] (1 in 500), bleeding (1 in 200), allergic reaction [possibly serious] (1 in 200), and agrees to proceed.    Please schedule him for PCI with atherectomy on either November 16 or November 17.  Corky Crafts, MD

## 2021-05-07 NOTE — H&P (View-Only) (Signed)
PCP: Gareth Morgan, MD Primary Cardiologist: Dr Royann Shivers Primary EP: Dr Johney Frame  [name] is a 60 y.o. male who presents today for routine electrophysiology followup.  Since last being seen in our clinic, the patient reports doing reasonably well. He has been intermittently in AF for the past year and more persistently over the past few months.  + SOB and fatigue.   He has EF 30% which is new.  He was evaluated and recently underwent cath.  He had PCI and is planned for further PCI on 05/16/21.  Past Medical History:  Diagnosis Date   Abdominal distention    Anemia    Postoperative   Chronic diastolic CHF (congestive heart failure) (HCC)    Coronary artery disease    a. premature - acute MI age 54 s/p BMS to prox LAD. b. s/p DES to RCA, PLA, mLAD in 2007. c. 4V CABG in 2008 with LIMA to LAD, SVG to first diagonal, SVG to first OM, SVG to PDA   Diabetes (HCC)    Edema    H/O gastroesophageal reflux (GERD)    High cholesterol    Hypertension    Myocardial infarction (HCC)    Obesity    Persistent atrial fibrillation (HCC)    a. on Tikosyn, Xarelto.   Pleural effusion    resolved with therapy   Past Surgical History:  Procedure Laterality Date   ABLATION OF DYSRHYTHMIC FOCUS  09/23/2017   ATRIAL FIBRILLATION ABLATION N/A 09/23/2017   Procedure: ATRIAL FIBRILLATION ABLATION;  Surgeon: Hillis Range, MD;  Location: [facility];  Service: Cardiovascular;  Laterality: N/A;   CARDIAC CATHETERIZATION  2008   CORONARY ANGIOPLASTY  1996   CORONARY ARTERY BYPASS GRAFT  2008   LIMA-LAD, SVG-Dx, SVG-OM1, SVG-PDA   CORONARY STENT INTERVENTION N/A 05/01/2021   Procedure: CORONARY STENT INTERVENTION;  Surgeon: Corky Crafts, MD;  Location: [facility];  Service: Cardiovascular;  Laterality: N/A;   LEFT HEART CATH AND CORS/GRAFTS ANGIOGRAPHY N/A 09/25/2016   Procedure: Left Heart Cath and Cors/Grafts Angiography;  Surgeon: Corky Crafts, MD;  Location: [facility]  INVASIVE CV LAB;  Service: Cardiovascular;  Laterality: N/A;   LEFT HEART CATH AND CORS/GRAFTS ANGIOGRAPHY N/A 05/01/2021   Procedure: LEFT HEART CATH AND CORS/GRAFTS ANGIOGRAPHY;  Surgeon: Corky Crafts, MD;  Location: [facility] INVASIVE CV LAB;  Service: Cardiovascular;  Laterality: N/A;    ROS- all systems are reviewed and negatives except as per HPI above  Current Outpatient Medications  Medication Sig Dispense Refill   apixaban (ELIQUIS) 5 MG TABS tablet TAKE ONE TABLET BY MOUTH TWICE A DAY 60 tablet 5   atorvastatin (LIPITOR) 40 MG tablet Take 40 mg by mouth at bedtime.      clopidogrel (PLAVIX) 75 MG tablet Take 1 tablet (75 mg total) by mouth daily with breakfast. 30 tablet 5   dapagliflozin propanediol (FARXIGA) 10 MG TABS tablet Take 10 mg by mouth daily.     Ergocalciferol (VITAMIN D2 PO) Take 1 tablet by mouth daily.     ferrous sulfate 325 (65 FE) MG tablet Take 325 mg by mouth daily.     furosemide (LASIX) 40 MG tablet Take 40 mg by mouth daily.     glimepiride (AMARYL) 4 MG tablet Take 4 mg by mouth daily with breakfast.     isosorbide mononitrate (IMDUR) 30 MG 24 hr tablet Take 30 mg by mouth daily.     JANUVIA 100 MG tablet Take 100 mg by mouth  daily.      losartan (COZAAR) 50 MG tablet Take 50 mg by mouth daily.     metFORMIN (GLUCOPHAGE) 1000 MG tablet Take 1 tablet (1,000 mg total) by mouth 2 (two) times daily with a meal.     Multiple Vitamins-Minerals (MULTIVITAMIN WITH MINERALS) tablet Take 1 tablet by mouth 2 (two) times daily.      Nebivolol HCl (BYSTOLIC) 20 MG TABS Take 1 tablet (20 mg total) by mouth every morning. 90 tablet 3   nitroGLYCERIN (NITROSTAT) 0.4 MG SL tablet Place 1 tablet (0.4 mg total) under the tongue every 5 (five) minutes as needed for chest pain. 25 tablet 4   potassium chloride SA (K-DUR,KLOR-CON) 20 MEQ tablet Take 1 tablet (20 mEq total) by mouth daily. 90 tablet 3   acetaminophen (TYLENOL) 325 MG tablet Take 2 tablets (650 mg total) by mouth  every 4 (four) hours as needed for headache or mild pain. (Patient not taking: No sig reported)     No current facility-administered medications for this visit.    Physical Exam: Vitals:   05/07/21 1341  BP: 128/70  Pulse: 94  SpO2: 97%  Weight: 233 lb (105.7 kg)  Height: 6' (1.829 m)    GEN- The patient is well appearing, alert and oriented x 3 today.   Head- normocephalic, atraumatic Eyes-  Sclera clear, conjunctiva pink Ears- hearing intact Oropharynx- clear Lungs- Clear to ausculation bilaterally, normal work of breathing Heart- iRRR GI- soft, NT, ND, + BS Extremities- no clubbing, cyanosis, or edema  Wt Readings from Last 3 Encounters:  05/07/21 233 lb (105.7 kg)  05/01/21 236 lb (107 kg)  04/23/21 248 lb 12.8 oz (112.9 kg)    EKG tracing ordered today is personally reviewed and shows afib  Assessment and Plan:  Persistent afib Afib has returned.  He did well initially post ablation The patient has symptomatic, recurrent  atrial fibrillation.  I suspect that his symptoms and reduced EF are largely due to AF. Chads2vasc score is 3.  he is anticoagulated with eliquis . Therapeutic strategies for afib including medicine  (tikosyn, amiodarone) and ablation were discussed in detail with the patient today. Risk, benefits, and alternatives to each approach were discussed.  Given that he will have anticoagulation interrupted and PCI next week, I would advise that we defer ablation.  I have therefore advised tikosyn for at least a 3 month bridge while he recovers from PCI. We will need TEE prior to tikosyn given that he will hold [facility] for upcoming PCI.  I would advise [place] at the same time as his TEE.  2. HTN Stable No change required today  3. Overweight Body mass index is 31.6 kg/m. Lifestyle modification advised  4. CAD Recent cath reviewed Plans for PCI noted.  We will defer ablation and proceed with tikosyn load in 2-3 weeks as above  5. Chronic  systolic Stable No change required today Possibly somewhat tachycardia mediated Hopefully his EF will recover with sinus rhythm   Risks, benefits and potential toxicities for medications prescribed and/or refilled reviewed with patient today.   Thompson Grayer MD, [facility] 05/07/2021 2:02 PM

## 2021-05-07 NOTE — H&P (View-Only) (Signed)
PCP: [name] Morgan, MD Primary Cardiologist: Dr [name] Primary EP: Dr [name]  [name] is a 60 y.o. male who presents today for routine electrophysiology followup.  Since last being seen in our clinic, the patient reports doing reasonably well. He has been intermittently in AF for the past year and more persistently over the past few months.  + SOB and fatigue.   He has EF 30% which is new.  He was evaluated and recently underwent cath.  He had PCI and is planned for further PCI on 05/16/21.  Past Medical History:  Diagnosis Date   Abdominal distention    Anemia    Postoperative   Chronic diastolic CHF (congestive heart failure) (HCC)    Coronary artery disease    a. premature - acute MI age 54 s/p BMS to prox LAD. b. s/p DES to RCA, PLA, mLAD in 2007. c. 4V CABG in 2008 with LIMA to LAD, SVG to first diagonal, SVG to first OM, SVG to PDA   Diabetes (HCC)    Edema    H/O gastroesophageal reflux (GERD)    High cholesterol    Hypertension    Myocardial infarction (HCC)    Obesity    Persistent atrial fibrillation (HCC)    a. on Tikosyn, Xarelto.   Pleural effusion    resolved with therapy   Past Surgical History:  Procedure Laterality Date   ABLATION OF DYSRHYTHMIC FOCUS  09/23/2017   ATRIAL FIBRILLATION ABLATION N/A 09/23/2017   Procedure: ATRIAL FIBRILLATION ABLATION;  Surgeon: [name] Range, MD;  Location: [facility];  Service: Cardiovascular;  Laterality: N/A;   CARDIAC CATHETERIZATION  2008   CORONARY ANGIOPLASTY  1996   CORONARY ARTERY BYPASS GRAFT  2008   LIMA-LAD, SVG-Dx, SVG-OM1, SVG-PDA   CORONARY STENT INTERVENTION N/A 05/01/2021   Procedure: CORONARY STENT INTERVENTION;  Surgeon: [name] Crafts, MD;  Location: [facility]VAS[facility];  Service: Cardiovascular;  Laterality: N/A;   LEFT HEART CATH AND CORS/GRAFTS ANGIOGRAPHY N/A 09/25/2016   Procedure: Left Heart Cath and Cors/Grafts Angiography;  Surgeon: [name] Crafts, MD;  Location: [facility]  INVASIVE CV LAB;  Service: Cardiovascular;  Laterality: N/A;   LEFT HEART CATH AND CORS/GRAFTS ANGIOGRAPHY N/A 05/01/2021   Procedure: LEFT HEART CATH AND CORS/GRAFTS ANGIOGRAPHY;  Surgeon: [name] Crafts, MD;  Location: [facility] INVASIVE CV LAB;  Service: Cardiovascular;  Laterality: N/A;    ROS- all systems are reviewed and negatives except as per HPI above  Current Outpatient Medications  Medication Sig Dispense Refill   apixaban (ELIQUIS) 5 MG TABS tablet TAKE ONE TABLET BY MOUTH TWICE A DAY 60 tablet 5   atorvastatin (LIPITOR) 40 MG tablet Take 40 mg by mouth at bedtime.      clopidogrel (PLAVIX) 75 MG tablet Take 1 tablet (75 mg total) by mouth daily with breakfast. 30 tablet 5   dapagliflozin propanediol (FARXIGA) 10 MG TABS tablet Take 10 mg by mouth daily.     Ergocalciferol (VITAMIN D2 PO) Take 1 tablet by mouth daily.     ferrous sulfate 325 (65 FE) MG tablet Take 325 mg by mouth daily.     furosemide (LASIX) 40 MG tablet Take 40 mg by mouth daily.     glimepiride (AMARYL) 4 MG tablet Take 4 mg by mouth daily with breakfast.     isosorbide mononitrate (IMDUR) 30 MG 24 hr tablet Take 30 mg by mouth daily.     JANUVIA 100 MG tablet Take 100 mg by mouth  daily.      losartan (COZAAR) 50 MG tablet Take 50 mg by mouth daily.     metFORMIN (GLUCOPHAGE) 1000 MG tablet Take 1 tablet (1,000 mg total) by mouth 2 (two) times daily with a meal.     Multiple Vitamins-Minerals (MULTIVITAMIN WITH MINERALS) tablet Take 1 tablet by mouth 2 (two) times daily.      Nebivolol HCl (BYSTOLIC) 20 MG TABS Take 1 tablet (20 mg total) by mouth every morning. 90 tablet 3   nitroGLYCERIN (NITROSTAT) 0.4 MG SL tablet Place 1 tablet (0.4 mg total) under the tongue every 5 (five) minutes as needed for chest pain. 25 tablet 4   potassium chloride SA (K-DUR,KLOR-CON) 20 MEQ tablet Take 1 tablet (20 mEq total) by mouth daily. 90 tablet 3   acetaminophen (TYLENOL) 325 MG tablet Take 2 tablets (650 mg total) by mouth  every 4 (four) hours as needed for headache or mild pain. (Patient not taking: No sig reported)     No current facility-administered medications for this visit.    Physical Exam: Vitals:   05/07/21 1341  BP: 128/70  Pulse: 94  SpO2: 97%  Weight: 233 lb (105.7 kg)  Height: 6' (1.829 m)    GEN- The patient is well appearing, alert and oriented x 3 today.   Head- normocephalic, atraumatic Eyes-  Sclera clear, conjunctiva pink Ears- hearing intact Oropharynx- clear Lungs- Clear to ausculation bilaterally, normal work of breathing Heart- iRRR GI- soft, NT, ND, + BS Extremities- no clubbing, cyanosis, or edema  Wt Readings from Last 3 Encounters:  05/07/21 233 lb (105.7 kg)  05/01/21 236 lb (107 kg)  04/23/21 248 lb 12.8 oz (112.9 kg)    EKG tracing ordered today is personally reviewed and shows afib  Assessment and Plan:  Persistent afib Afib has returned.  He did well initially post ablation The patient has symptomatic, recurrent  atrial fibrillation.  I suspect that his symptoms and reduced EF are largely due to AF. Chads2vasc score is 3.  he is anticoagulated with eliquis . Therapeutic strategies for afib including medicine  (tikosyn, amiodarone) and ablation were discussed in detail with the patient today. Risk, benefits, and alternatives to each approach were discussed.  Given that he will have anticoagulation interrupted and PCI next week, I would advise that we defer ablation.  I have therefore advised tikosyn for at least a 3 month bridge while he recovers from PCI. We will need TEE prior to tikosyn given that he will hold [facility] for upcoming PCI.  I would advise [name] at the same time as his TEE.  2. HTN Stable No change required today  3. Overweight Body mass index is 31.6 kg/m. Lifestyle modification advised  4. CAD Recent cath reviewed Plans for PCI noted.  We will defer ablation and proceed with tikosyn load in 2-3 weeks as above  5. Chronic  systolic Stable No change required today Possibly somewhat tachycardia mediated Hopefully his EF will recover with sinus rhythm   Risks, benefits and potential toxicities for medications prescribed and/or refilled reviewed with patient today.   Thompson Grayer MD, [facility] 05/07/2021 2:02 PM

## 2021-05-07 NOTE — Patient Instructions (Addendum)
Medication Instructions:  Your physician recommends that you continue on your current medications as directed. Please refer to the Current Medication list given to you today. *If you need a refill on your cardiac medications before your next appointment, please call your pharmacy*  Lab Work: None. If you have labs (blood work) drawn today and your tests are completely normal, you will receive your results only by: MyChart Message (if you have MyChart) OR A paper copy in the mail If you have any lab test that is abnormal or we need to change your treatment, we will call you to review the results.  Testing/Procedures: Your physician has requested that you have a TEE. During a TEE, sound waves are used to create images of your heart. It provides your doctor with information about the size and shape of your heart and how well your heart's chambers and valves are working. In this test, a transducer is attached to the end of a flexible tube that's guided down your throat and into your esophagus (the tube leading from you mouth to your stomach) to get a more detailed image of your heart. You are not awake for the procedure. Please see the instruction sheet given to you today. For further information please visit [email or web address].  Your physician has recommended that you have a Cardioversion (DCCV). Electrical Cardioversion uses a jolt of electricity to your heart either through paddles or wired patches attached to your chest. This is a controlled, usually prescheduled, procedure. Defibrillation is done under light anesthesia in the hospital, and you usually go home the day of the procedure. This is done to get your heart back into a normal rhythm. You are not awake for the procedure. Please see the instruction sheet given to you today.   Follow-Up: At [facility], you and your health needs are our priority.  As part of our continuing mission to provide you with exceptional heart care, we have created  designated Provider Care Teams.  These Care Teams include your primary Cardiologist (physician) and Advanced Practice Providers (APPs -  Physician Assistants and Nurse Practitioners) who all work together to provide you with the care you need, when you need it.  Your physician wants you to follow-up in: Tikosyn admission with TEE/Cardioversion. The Afib Clinic will contact you to schedule.   We recommend signing up for the patient portal called "MyChart".  Sign up information is provided on this After Visit Summary.  MyChart is used to connect with patients for Virtual Visits (Telemedicine).  Patients are able to view lab/test results, encounter notes, upcoming appointments, etc.  Non-urgent messages can be sent to your provider as well.   To learn more about what you can do with MyChart, go to [email or web address].    Any Other Special Instructions Will Be Listed Below (If Applicable).  Tikosyn (Dofetilide) Hospital Admission  Prior to day of admission: Check with drug insurance company for cost of drug to ensure affordability --- Dofetilide 500 mcg twice a day.  GoodRx is an option if insurance copay is unaffordable.  All patients are tested for COVID-19 prior to admission.  No Benadryl is allowed 3 days prior to admission.  Please ensure no missed doses of your anticoagulation (blood thinner) for 3 weeks prior to admission. If a dose is missed please notify our office immediately.  A pharmacist will review all your medications for potential interactions with Tikosyn. If any medication changes are needed prior to admission we will be in touch with you.  If any  new medications are started AFTER your admission date is set with Kennyth Arnold RN. Please notify our office immediately so your medication list can be updated and reviewed by our pharmacist again. On day of admission: Tikosyn initiation requires a 3 night/4 day hospital stay with constant telemetry monitoring. You will have an EKG after  each dose of Tikosyn as well as daily lab draws.  If the drug does not convert you to normal rhythm a cardioversion after the 4th dose of Tikosyn.  Afib Clinic office visit on the morning of admission is needed for preliminary labs/ekg.  Time of admission is dependent on bed availability in the hospital. In some instances, you will be sent home until bed is available. Rarely admission can be delayed to the following day if hospital census prevents available beds.  You may bring personal belongings/clothing with you to the hospital. Please leave your suitcase in the car until you arrive in admissions.  Questions please call our office at [phone]

## 2021-05-08 ENCOUNTER — Telehealth: Payer: Self-pay | Admitting: Pharmacist

## 2021-05-08 NOTE — Telephone Encounter (Signed)
Medication list reviewed in anticipation of upcoming Tikosyn initiation. Patient is not taking any contraindicated or QTc prolonging medications.   Patient is anticoagulated on Eliquis 5mg  BID on the appropriate dose. Please ensure that patient has not missed any anticoagulation doses in the 3 weeks prior to Tikosyn initiation. Per afib clinic, pt will have TEE prior to admit as he will hold Eliquis for stent placement on 11/16 and admit for Tikosyn about 2 weeks later.   Patient will need to be counseled to avoid use of Benadryl while on Tikosyn and in the 2-3 days prior to Tikosyn initiation.

## 2021-05-08 NOTE — Progress Notes (Signed)
Spoke with patient to get arthrectomy scheduled for Wednesday November 16 per Dr Eldridge Dace.    Patient to arrive @ 6am, labs to be drawn morning of procedure.  He will stop his Eliquis on Sunday 11/13, last dose that night.  He will not take any Metformin on Tuesday or Wednesday and 2 days post procedure.   He will take a Baby Asa and Plavix the morning of procedure.  Nothing to eat or drink after midnight on Tuesday night.  Told him he may or may not spend the night this time.

## 2021-05-11 ENCOUNTER — Encounter (HOSPITAL_COMMUNITY): Payer: Self-pay | Admitting: *Deleted

## 2021-05-11 ENCOUNTER — Other Ambulatory Visit (HOSPITAL_COMMUNITY): Payer: Self-pay | Admitting: *Deleted

## 2021-05-11 DIAGNOSIS — I4819 Other persistent atrial fibrillation: Secondary | ICD-10-CM

## 2021-05-11 NOTE — Telephone Encounter (Signed)
TEE scheduled for 11/29 prior to tikosyn admission

## 2021-05-15 ENCOUNTER — Telehealth: Payer: Self-pay | Admitting: *Deleted

## 2021-05-15 ENCOUNTER — Other Ambulatory Visit (HOSPITAL_COMMUNITY): Payer: Self-pay

## 2021-05-15 NOTE — Telephone Encounter (Signed)
Coronary atherectomy scheduled at [facility] for: Wednesday May 16, 2021 8:30 AM [facility] Main Entrance [facility]) at: 6:30 AM   No solid food after midnight prior to cath, clear liquids until 5 AM day of procedure.  Medication instructions: Hold: Eliquis-none 05/14/21 until post procedure Farxiga/Januvia/Glimepiride-AM of procedure Metformin-day of procedure and 48 hours post procedure Lasix/KCl-AM of procedure  Except hold medications usual morning medications can be taken pre-cath with sips of water including: -aspirin 81 mg -Plavix 75 mg    Confirmed patient has responsible adult to drive home post procedure and be with patient first 24 hours after arriving home.  [facility] does allow one visitor to accompany you and wait in the hospital waiting room while you are there for your procedure. You and your visitor will be asked to wear a mask once you enter the hospital.   Patient reports does not currently have any new symptoms concerning for COVID-19 and no household members with COVID-19 like illness.    Reviewed procedure/mask/visitor instructions with patient.

## 2021-05-16 ENCOUNTER — Ambulatory Visit (HOSPITAL_COMMUNITY)
Admission: RE | Admit: 2021-05-16 | Discharge: 2021-05-16 | Disposition: A | Discharge status: Home / Self Care | Payer: Federal, State, Local not specified - PPO | Attending: Interventional Cardiology | Admitting: Interventional Cardiology

## 2021-05-16 ENCOUNTER — Encounter (HOSPITAL_COMMUNITY)
Admission: RE | Disposition: A | Discharge status: Home / Self Care | Payer: Self-pay | Source: Home / Self Care | Attending: Interventional Cardiology

## 2021-05-16 ENCOUNTER — Other Ambulatory Visit: Payer: Self-pay

## 2021-05-16 DIAGNOSIS — E785 Hyperlipidemia, unspecified: Secondary | ICD-10-CM | POA: Diagnosis not present

## 2021-05-16 DIAGNOSIS — Z955 Presence of coronary angioplasty implant and graft: Secondary | ICD-10-CM | POA: Insufficient documentation

## 2021-05-16 DIAGNOSIS — I110 Hypertensive heart disease with heart failure: Secondary | ICD-10-CM | POA: Insufficient documentation

## 2021-05-16 DIAGNOSIS — E119 Type 2 diabetes mellitus without complications: Secondary | ICD-10-CM

## 2021-05-16 DIAGNOSIS — I25118 Atherosclerotic heart disease of native coronary artery with other forms of angina pectoris: Secondary | ICD-10-CM | POA: Diagnosis not present

## 2021-05-16 DIAGNOSIS — Z6831 Body mass index (BMI) 31.0-31.9, adult: Secondary | ICD-10-CM | POA: Diagnosis not present

## 2021-05-16 DIAGNOSIS — I5042 Chronic combined systolic (congestive) and diastolic (congestive) heart failure: Secondary | ICD-10-CM | POA: Diagnosis present

## 2021-05-16 DIAGNOSIS — E669 Obesity, unspecified: Secondary | ICD-10-CM | POA: Diagnosis not present

## 2021-05-16 DIAGNOSIS — Z7901 Long term (current) use of anticoagulants: Secondary | ICD-10-CM | POA: Diagnosis not present

## 2021-05-16 DIAGNOSIS — Z951 Presence of aortocoronary bypass graft: Secondary | ICD-10-CM

## 2021-05-16 DIAGNOSIS — K219 Gastro-esophageal reflux disease without esophagitis: Secondary | ICD-10-CM | POA: Diagnosis present

## 2021-05-16 DIAGNOSIS — I4819 Other persistent atrial fibrillation: Secondary | ICD-10-CM | POA: Diagnosis present

## 2021-05-16 DIAGNOSIS — I2510 Atherosclerotic heart disease of native coronary artery without angina pectoris: Secondary | ICD-10-CM | POA: Diagnosis present

## 2021-05-16 DIAGNOSIS — I10 Essential (primary) hypertension: Secondary | ICD-10-CM | POA: Diagnosis present

## 2021-05-16 HISTORY — PX: CORONARY ULTRASOUND/IVUS: CATH118244

## 2021-05-16 HISTORY — PX: CORONARY ATHERECTOMY: CATH118238

## 2021-05-16 HISTORY — PX: LEFT HEART CATH: CATH118248

## 2021-05-16 HISTORY — PX: CORONARY STENT INTERVENTION: CATH118234

## 2021-05-16 LAB — GLUCOSE, CAPILLARY
Glucose-Capillary: 173 mg/dL — ABNORMAL HIGH (ref 70–99)
Glucose-Capillary: 210 mg/dL — ABNORMAL HIGH (ref 70–99)

## 2021-05-16 LAB — BASIC METABOLIC PANEL
Anion gap: 8 (ref 5–15)
BUN: 20 mg/dL (ref 6–20)
CO2: 24 mmol/L (ref 22–32)
Calcium: 9.5 mg/dL (ref 8.9–10.3)
Chloride: 103 mmol/L (ref 98–111)
Creatinine, Ser: 1.07 mg/dL (ref 0.61–1.24)
GFR, Estimated: >60 mL/min (ref >60)
Glucose, Bld: 193 mg/dL — ABNORMAL HIGH (ref 70–99)
Potassium: 4.4 mmol/L (ref 3.5–5.1)
Sodium: 135 mmol/L (ref 135–145)

## 2021-05-16 LAB — CBC
HCT: 44.3 % (ref 39.0–52.0)
Hemoglobin: 14 g/dL (ref 13.0–17.0)
MCH: 28.2 pg (ref 26.0–34.0)
MCHC: 31.6 g/dL (ref 30.0–36.0)
MCV: 89.3 fL (ref 80.0–100.0)
Platelets: 228 10*3/uL (ref 150–400)
RBC: 4.96 MIL/uL (ref 4.22–5.81)
RDW: 14.5 % (ref 11.5–15.5)
WBC: 9.8 10*3/uL (ref 4.0–10.5)
nRBC: 0 % (ref 0.0–0.2)

## 2021-05-16 SURGERY — CORONARY ATHERECTOMY
Anesthesia: LOCAL

## 2021-05-16 MED ORDER — SODIUM CHLORIDE 0.9% FLUSH: 3.0000 mL | Freq: Two times a day (BID) | INTRAVENOUS | Status: DC

## 2021-05-16 MED ORDER — HEPARIN SODIUM (PORCINE) 1000 UNIT/ML IJ SOLN
INTRAMUSCULAR | Status: DC | PRN
  Administered 2021-05-16 (×2): 3000 [IU] via INTRAVENOUS
  Administered 2021-05-16: 2000 [IU] via INTRAVENOUS
  Administered 2021-05-16: 10000 [IU] via INTRAVENOUS

## 2021-05-16 MED ORDER — ASPIRIN 81 MG PO CHEW: 81.0000 mg | CHEWABLE_TABLET | ORAL | Status: DC

## 2021-05-16 MED ORDER — NITROGLYCERIN 1 MG/10 ML FOR IR/CATH LAB
INTRA_ARTERIAL | Status: AC
  Filled 2021-05-16: qty 10

## 2021-05-16 MED ORDER — IOHEXOL 350 MG/ML SOLN
INTRAVENOUS | Status: DC | PRN
  Administered 2021-05-16: 135 mL

## 2021-05-16 MED ORDER — VERAPAMIL HCL 2.5 MG/ML IV SOLN
INTRAVENOUS | Status: DC | PRN
  Administered 2021-05-16: 10 mL via INTRA_ARTERIAL

## 2021-05-16 MED ORDER — SODIUM CHLORIDE 0.9 % IV SOLN: 250.0000 mL | INTRAVENOUS | Status: DC | PRN

## 2021-05-16 MED ORDER — HEPARIN (PORCINE) IN NACL 1000-0.9 UT/500ML-% IV SOLN
INTRAVENOUS | Status: AC
  Filled 2021-05-16: qty 500

## 2021-05-16 MED ORDER — APIXABAN 5 MG PO TABS: 5.0000 mg | ORAL_TABLET | Freq: Two times a day (BID) | ORAL | 5 refills | Status: DC

## 2021-05-16 MED ORDER — SODIUM CHLORIDE 0.9 % IV SOLN: INTRAVENOUS | Status: DC

## 2021-05-16 MED ORDER — HEPARIN (PORCINE) IN NACL 1000-0.9 UT/500ML-% IV SOLN
INTRAVENOUS | Status: DC | PRN
  Administered 2021-05-16 (×2): 500 mL

## 2021-05-16 MED ORDER — MIDAZOLAM HCL 2 MG/2ML IJ SOLN
INTRAMUSCULAR | Status: DC | PRN
  Administered 2021-05-16: 2 mg via INTRAVENOUS

## 2021-05-16 MED ORDER — MIDAZOLAM HCL 2 MG/2ML IJ SOLN
INTRAMUSCULAR | Status: AC
  Filled 2021-05-16: qty 2

## 2021-05-16 MED ORDER — VERAPAMIL HCL 2.5 MG/ML IV SOLN
INTRAVENOUS | Status: AC
  Filled 2021-05-16: qty 2

## 2021-05-16 MED ORDER — FENTANYL CITRATE (PF) 100 MCG/2ML IJ SOLN
INTRAMUSCULAR | Status: AC
  Filled 2021-05-16: qty 2

## 2021-05-16 MED ORDER — METFORMIN HCL 1000 MG PO TABS
1000.0000 mg | ORAL_TABLET | Freq: Two times a day (BID) | ORAL | Status: DC
Start: 2021-05-16 — End: 2023-11-10

## 2021-05-16 MED ORDER — CLOPIDOGREL BISULFATE 75 MG PO TABS: 75.0000 mg | ORAL_TABLET | Freq: Every day | ORAL | Status: DC

## 2021-05-16 MED ORDER — HYDRALAZINE HCL 20 MG/ML IJ SOLN: 10.0000 mg | INTRAMUSCULAR | Status: DC | PRN

## 2021-05-16 MED ORDER — ATORVASTATIN CALCIUM 80 MG PO TABS: 80.0000 mg | ORAL_TABLET | Freq: Every day | ORAL | 3 refills | Status: DC

## 2021-05-16 MED ORDER — SODIUM CHLORIDE 0.9 % IV SOLN: INTRAVENOUS | Status: AC

## 2021-05-16 MED ORDER — NEBIVOLOL HCL 20 MG PO TABS: 20.0000 mg | ORAL_TABLET | ORAL | 3 refills | Status: DC

## 2021-05-16 MED ORDER — HEPARIN SODIUM (PORCINE) 1000 UNIT/ML IJ SOLN
INTRAMUSCULAR | Status: AC
  Filled 2021-05-16: qty 1

## 2021-05-16 MED ORDER — SODIUM CHLORIDE 0.9% FLUSH: 3.0000 mL | INTRAVENOUS | Status: DC | PRN

## 2021-05-16 MED ORDER — CLOPIDOGREL BISULFATE 75 MG PO TABS: 75.0000 mg | ORAL_TABLET | ORAL | Status: DC

## 2021-05-16 MED ORDER — LIDOCAINE HCL (PF) 1 % IJ SOLN
INTRAMUSCULAR | Status: AC
  Filled 2021-05-16: qty 30

## 2021-05-16 MED ORDER — ONDANSETRON HCL 4 MG/2ML IJ SOLN: 4.0000 mg | Freq: Four times a day (QID) | INTRAMUSCULAR | Status: DC | PRN

## 2021-05-16 MED ORDER — NITROGLYCERIN 1 MG/10 ML FOR IR/CATH LAB
INTRA_ARTERIAL | Status: DC | PRN
  Administered 2021-05-16: 200 ug via INTRA_ARTERIAL

## 2021-05-16 MED ORDER — ACETAMINOPHEN 325 MG PO TABS: 650.0000 mg | ORAL_TABLET | ORAL | Status: DC | PRN

## 2021-05-16 MED ORDER — SODIUM CHLORIDE 0.9 % IV SOLN
INTRAVENOUS | Status: DC | PRN
  Administered 2021-05-16: 15 mL via INTRAVENOUS

## 2021-05-16 MED ORDER — FENTANYL CITRATE (PF) 100 MCG/2ML IJ SOLN
INTRAMUSCULAR | Status: DC | PRN
  Administered 2021-05-16 (×2): 25 ug via INTRAVENOUS

## 2021-05-16 MED ORDER — LABETALOL HCL 5 MG/ML IV SOLN: 10.0000 mg | INTRAVENOUS | Status: DC | PRN

## 2021-05-16 MED ORDER — LIDOCAINE HCL (PF) 1 % IJ SOLN
INTRAMUSCULAR | Status: DC | PRN
  Administered 2021-05-16: 2 mL

## 2021-05-16 SURGICAL SUPPLY — 26 items
BALLN SAPPHIRE 3.0X15 (BALLOONS) ×3
BALLN SAPPHIRE ~~LOC~~ 4.0X15 (BALLOONS) ×3 IMPLANT
BALLOON SAPPHIRE 3.0X15 (BALLOONS) ×1 IMPLANT
CATH INFINITI JR4 5F (CATHETERS) ×6 IMPLANT
CATH LAUNCHER 6FR EBU 3.75 (CATHETERS) ×3 IMPLANT
CATH OPTICROSS HD (CATHETERS) ×3 IMPLANT
CATH TELEPORT (CATHETERS) ×3 IMPLANT
CROWN DIAMONDBACK CLASSIC 1.25 (BURR) ×3 IMPLANT
DEVICE RAD COMP TR BAND LRG (VASCULAR PRODUCTS) ×3 IMPLANT
ELECT DEFIB PAD ADLT CADENCE (PAD) ×3 IMPLANT
GLIDESHEATH SLEND SS 6F .021 (SHEATH) ×3 IMPLANT
GUIDEWIRE INQWIRE 1.5J.035X260 (WIRE) ×1 IMPLANT
INQWIRE 1.5J .035X260CM (WIRE) ×3
KIT ENCORE 26 ADVANTAGE (KITS) ×3 IMPLANT
KIT HEART LEFT (KITS) ×3 IMPLANT
KIT HEMO VALVE WATCHDOG (MISCELLANEOUS) ×3 IMPLANT
LUBRICANT VIPERSLIDE CORONARY (MISCELLANEOUS) ×3 IMPLANT
PACK CARDIAC CATHETERIZATION (CUSTOM PROCEDURE TRAY) ×3 IMPLANT
SHEATH PROBE COVER 6X72 (BAG) ×3 IMPLANT
SLED PULL BACK IVUS (MISCELLANEOUS) ×3 IMPLANT
STENT ONYX FRONTIER 3.5X26 (Permanent Stent) ×3 IMPLANT
TRANSDUCER W/STOPCOCK (MISCELLANEOUS) ×3 IMPLANT
TUBING CIL FLEX 10 FLL-RA (TUBING) ×3 IMPLANT
WIRE ASAHI PROWATER 180CM (WIRE) ×3 IMPLANT
WIRE FIGHTER CROSSING 190CM (WIRE) ×3 IMPLANT
WIRE VIPERWIRE COR FLEX .012 (WIRE) ×3 IMPLANT

## 2021-05-16 NOTE — Discharge Summary (Addendum)
Discharge Summary    Patient ID: [name] MRN: [id number]; DOB: Jun 23, 1961  Admit date: 05/16/2021 Discharge date: 05/16/2021  PCP:  [name] Evens, MD   [facility] Providers Cardiologist:  [name] Klein, MD  Electrophysiologist:  [name] Grayer, MD  {  Discharge Diagnoses    Principal Problem:   CAD (coronary artery disease) Active Problems:   GASTROESOPHAGEAL REFLUX DISEASE   Persistent atrial fibrillation ([place])   Hx of CABG   Essential hypertension   Dyslipidemia, goal LDL below 70   Non-insulin dependent type 2 diabetes mellitus ([place])   Obesity (BMI 30-39.9)   Chronic combined systolic and diastolic CHF (congestive heart failure) ([place])   Long term current use of anticoagulant therapy    Diagnostic Studies/Procedures    Coronary atherectomy 05/16/21: Ost LM to Mid LM lesion is 95% stenosed.   Prox Cx lesion is 80% stenosed.   A drug-eluting stent was successfully placed using a STENT ONYX FRONTIER 3.5X26, postdilated to greater than 4 mm and optimized with intravascular ultrasound.  This one stent covered both the left main and the circumflex lesions.   Post intervention, there is a 0% residual stenosis.   Ost LAD to Prox LAD lesion is 90% stenosed.  Known patent LIMA to LAD.  Flow remained in the LAD after the stent was placed across the ostium.   A drug-eluting stent was successfully placed using a STENT ONYX FRONTIER 3.5X26.   Post intervention, there is a 0% residual stenosis.   LV end diastolic pressure is mildly elevated.   There is no aortic valve stenosis.   Restart Eliquis tomorrow.  Continue clopidogrel monotherapy.  No aspirin to decrease bleeding risk.  Given his extensive CAD, he may require ongoing antiplatelet therapy in the setting of Eliquis. _____________   Left heart cath 05/01/21:   Origin to Prox Graft lesion is 25% stenosed.   RPDA lesion is 80% stenosed.  SVG to PDA is patent.   Mid LAD lesion is 90% stenosed at large diagonal.   LIMA to LAD is patent.  SVG to diagonal is patent.   Ost LM to Mid LM lesion is 95% stenosed.   Ost Cx to Prox Cx lesion is 80% stenosed.   Prox RCA lesion is 50% stenosed.   RPAV lesion is 70% stenosed. Relatively small territory supplied.   2nd Mrg lesion is 100% stenosed.  SVG to OM2 Origin to Prox Graft lesion is 75% stenosed.   A drug-eluting stent was successfully placed in the SVG using a STENT ONYX FRONTIER 3.5X15, postdilated to 4.0 mm proximally.   Post intervention, there is a 0% residual stenosis.   There is mild left ventricular systolic dysfunction.   LV end diastolic pressure is mildly elevated.   The left ventricular ejection fraction is 35-45% by visual estimate.   There is no aortic valve stenosis.   Successful PCI of the SVG to OM.  This OM, on previous cath films, connected to the remainder of the circumflex and filled it retrograde.  Now the ostium of the OM is occluded and there is no retrograde filling of the circumflex.  His left main disease has progressed as well.   Plan for PCI of the left main and proximal circumflex.  This will likely require atherectomy or some other calcium modifying technique.  Could use right radial approach as we would not need to engage any bypass grafts.  History of Present Illness     [name] is a 60 y.o. male with  CAD status post CABG, hypertension, hyperlipidemia, DM 2, chronic combined systolic and diastolic heart failure, persistent atrial fibrillation on chronic anticoagulation with recent PCI.  Echocardiogram with a new reduction in EF to 30 to 35% on echocardiogram 04/16/2021. Patient was seen by [name] July, [name], on 03/27/2021 at which time he reported decreased energy level and decreased exercise capacity as well as occasional tachypalpitations. Bystolic was increased and repeat Echo was ordered for further evaluation. Echo showed newly reduced EF of 30-35% (down from 50-55%) and global hypokinesis. He was seen back by  [name] July, [name], on 04/23/2021 at which time he reported continued shortness of breath with moderate exertion as well as chronic bendopnea and mild orthopnea. He continued to deny chest pain. Therefore, outpatient cardiac catheterization was ordered for further evaluation.  He presented to [facility] on 05/01/2021 for planned cardiac catheterization.  Heart cath showed 75% stenosis of the SVG-OM 2 successfully treated with DES.  Other grafts were patent.  He also had a 95% stenosis of the ostial to mid left main and 80% stenosis of the ostial to proximal LCx.  He tolerated PCI well and was placed on Plavix x6 months.  No aspirin given need for Eliquis.  Staged PCI of left main and proximal left circumflex was planned.  He presented back to [facility] 05/16/2021 for atherectomy and stent placement.  Hospital Course     Consultants: none  CAD s/p CABG and recent PCI He presented for scheduled atherectomy of left main and proximal LCX. DEX x 2 placed. Pt tolerated the procedure well.  He was continued on Plavix.  No aspirin in the setting of Eliquis.  Groin site C/D/I.  Given his extensive CAD, may need to continue antiplatelet therapy long-term.   Persistent atrial fibrillation Chronic anticoagulation Resume Eliquis tomorrow morning, 05/17/2021.   Hypertension HFrEF Clarified medications. Bystolic was previously increased and he is taking 10 mg BID. Will continue 20 mg daily.    Hyperlipidemia with LDL goal < 70 Will increase lipitor to 80 mg given progression of disease.    DM Resume metformin in 48 hrs.   Right radial cath site C/D/I.     Did the patient have an acute coronary syndrome (MI, NSTEMI, STEMI, etc) this admission?:  Yes                               AHA/ACC Clinical Performance & Quality Measures: Aspirin prescribed? - No - eliquis ADP Receptor Inhibitor (Plavix/Clopidogrel, Brilinta/Ticagrelor or Effient/Prasugrel) prescribed (includes  medically managed patients)? - Yes Beta Blocker prescribed? - Yes High Intensity Statin (Lipitor 40-80mg  or Crestor 20-40mg ) prescribed? - Yes EF assessed during THIS hospitalization? - No - prior echo For EF <40%, was ACEI/ARB prescribed? - Yes For EF <40%, Aldosterone Antagonist (Spironolactone or Eplerenone) prescribed? - No - Reason:  BP Cardiac Rehab Phase II ordered (including medically managed patients)? - Yes       The patient will be scheduled for a TOC follow up appointment in 7-14 days.  A message has been sent to the [facility] and Scheduling Pool at the office where the patient should be seen for follow up.  _____________  Discharge Vitals Blood pressure 105/69, pulse 70, temperature (!) 97.4 F (36.3 C), temperature source Oral, resp. rate 20, height 6' (1.829 m), weight 106.1 kg, SpO2 97 %.  Filed Weights   05/16/21 0638  Weight: 106.1 kg    Labs &  Radiologic Studies    CBC Recent Labs    05/16/21 0653  WBC 9.8  HGB 14.0  HCT 44.3  MCV 89.3  PLT 228   Basic Metabolic Panel Recent Labs    16/10/96 0653  NA 135  K 4.4  CL 103  CO2 24  GLUCOSE 193*  BUN 20  CREATININE 1.07  CALCIUM 9.5   Liver Function Tests No results for input(s): AST, ALT, ALKPHOS, BILITOT, PROT, ALBUMIN in the last 72 hours. No results for input(s): LIPASE, AMYLASE in the last 72 hours. High Sensitivity Troponin:   No results for input(s): TROPONINIHS in the last 720 hours.  BNP Invalid input(s): POCBNP D-Dimer No results for input(s): DDIMER in the last 72 hours. Hemoglobin A1C No results for input(s): HGBA1C in the last 72 hours. Fasting Lipid Panel No results for input(s): CHOL, HDL, LDLCALC, TRIG, CHOLHDL, LDLDIRECT in the last 72 hours. Thyroid Function Tests No results for input(s): TSH, T4TOTAL, T3FREE, THYROIDAB in the last 72 hours.  Invalid input(s): FREET3 _____________  CARDIAC CATHETERIZATION  Result Date: 05/16/2021   Ost LM to Mid LM lesion is 95%  stenosed.   Prox Cx lesion is 80% stenosed.   A drug-eluting stent was successfully placed using a STENT ONYX FRONTIER 3.5X26, postdilated to greater than 4 mm and optimized with intravascular ultrasound.  This one stent covered both the left main and the circumflex lesions.   Post intervention, there is a 0% residual stenosis.   Ost LAD to Prox LAD lesion is 90% stenosed.  Known patent LIMA to LAD.  Flow remained in the LAD after the stent was placed across the ostium.   A drug-eluting stent was successfully placed using a STENT ONYX FRONTIER 3.5X26.   Post intervention, there is a 0% residual stenosis.   LV end diastolic pressure is mildly elevated.   There is no aortic valve stenosis. Restart Eliquis tomorrow.  Continue clopidogrel monotherapy.  No aspirin to decrease bleeding risk.  Given his extensive CAD, he may require ongoing antiplatelet therapy in the setting of Eliquis.   CARDIAC CATHETERIZATION  Result Date: 05/01/2021   Origin to Prox Graft lesion is 25% stenosed.   RPDA lesion is 80% stenosed.  SVG to PDA is patent.   Mid LAD lesion is 90% stenosed at large diagonal.  LIMA to LAD is patent.  SVG to diagonal is patent.   Ost LM to Mid LM lesion is 95% stenosed.   Ost Cx to Prox Cx lesion is 80% stenosed.   Prox RCA lesion is 50% stenosed.   RPAV lesion is 70% stenosed. Relatively small territory supplied.   2nd Mrg lesion is 100% stenosed.  SVG to OM2 Origin to Prox Graft lesion is 75% stenosed.   A drug-eluting stent was successfully placed in the SVG using a STENT ONYX FRONTIER 3.5X15, postdilated to 4.0 mm proximally.   Post intervention, there is a 0% residual stenosis.   There is mild left ventricular systolic dysfunction.   LV end diastolic pressure is mildly elevated.   The left ventricular ejection fraction is 35-45% by visual estimate.   There is no aortic valve stenosis. Successful PCI of the SVG to OM.  This OM, on previous cath films, connected to the remainder of the circumflex and  filled it retrograde.  Now the ostium of the OM is occluded and there is no retrograde filling of the circumflex.  His left main disease has progressed as well. Plan for PCI of the left main and  proximal circumflex.  This will likely require atherectomy or some other calcium modifying technique.  Could use right radial approach as we would not need to engage any bypass grafts. Restart Eliquis tomorrow.  The patient was loaded with clopidogrel today.  To minimize bleeding risk, will hold off on giving him aspirin at this time. Plan for same-day discharge if there are no bleeding issues.   Disposition   Pt is being discharged home today in good condition.  Follow-up Plans & Appointments     Follow-up Information     Croitoru, Mihai, MD Follow up on 06/01/2021.   Specialty: Cardiology Why: 8:00 AM post cath Contact information: [address] [place] [address] [phone]                Discharge Instructions     AMB Referral to Cardiac Rehabilitation - Phase II   Complete by: As directed    Diagnosis: Coronary Stents   After initial evaluation and assessments completed: Virtual Based Care may be provided alone or in conjunction with Phase 2 Cardiac Rehab based on patient barriers.: Yes       Discharge Medications   Allergies as of 05/16/2021   No Known Allergies      Medication List     TAKE these medications    apixaban 5 MG Tabs tablet Commonly known as: Eliquis Take 1 tablet (5 mg total) by mouth 2 (two) times daily. Resume 05/17/21 in the morning. What changed:  how much to take additional instructions   atorvastatin 80 MG tablet Commonly known as: LIPITOR Take 1 tablet (80 mg total) by mouth at bedtime. What changed:  medication strength how much to take   clopidogrel 75 MG tablet Commonly known as: PLAVIX Take 1 tablet (75 mg total) by mouth daily with breakfast.   dapagliflozin propanediol 10 MG Tabs tablet Commonly known as:  FARXIGA Take 10 mg by mouth in the morning.   ferrous sulfate 325 (65 FE) MG tablet Take 325 mg by mouth daily.   furosemide 40 MG tablet Commonly known as: LASIX Take 40 mg by mouth daily.   glimepiride 4 MG tablet Commonly known as: AMARYL Take 4 mg by mouth daily with breakfast.   isosorbide mononitrate 30 MG 24 hr tablet Commonly known as: IMDUR Take 30 mg by mouth in the morning.   Januvia 100 MG tablet Generic drug: sitaGLIPtin Take 100 mg by mouth in the morning.   losartan 50 MG tablet Commonly known as: COZAAR Take 50 mg by mouth every evening.   metFORMIN 1000 MG tablet Commonly known as: GLUCOPHAGE Take 1 tablet (1,000 mg total) by mouth 2 (two) times daily with a meal. Resume on 05/19/21 What changed: additional instructions   multivitamin with minerals tablet Take 1 tablet by mouth 2 (two) times daily.   Nebivolol HCl 20 MG Tabs Commonly known as: Bystolic Take 1 tablet (20 mg total) by mouth every morning.   nitroGLYCERIN 0.4 MG SL tablet Commonly known as: NITROSTAT Place 1 tablet (0.4 mg total) under the tongue every 5 (five) minutes as needed for chest pain.   potassium chloride SA 20 MEQ tablet Commonly known as: KLOR-CON Take 1 tablet (20 mEq total) by mouth daily.   VITAMIN D-3 PO Take 1 tablet by mouth in the morning.           Outstanding Labs/Studies   Titrate GDMT Repeat lipids  Duration of Discharge Encounter   Greater than 30 minutes including physician time.  Signed, Tami Lin Duke, PA 05/16/2021, 2:17 PM  I have examined the patient and reviewed assessment and plan and discussed with patient.  Agree with above as stated.    S/p complex PCI of left main and circumflex with atherectomy and stent placement.    DAPT along with secondary prevention.  Intensify meds for low EF.   Larae Grooms

## 2021-05-16 NOTE — Progress Notes (Signed)
Discussed/reviewed with pt stent, Plavix, restrictions, diet, exercise, NTG and CRPII. Receptive. Will place referral for CRPII however pt not interested.  [id number] Ethelda Chick CES, ACSM 2:50 PM 05/16/2021

## 2021-05-16 NOTE — Progress Notes (Signed)
Pt seen by APP, Dr. Eldridge Dace, and cardiac rehab prior to DC. EKG done post procedure and AVS reviewed with pt and wife. Pt verbalizes understanding.

## 2021-05-16 NOTE — Interval H&P Note (Signed)
Cath Lab Visit (complete for each Cath Lab visit)  Clinical Evaluation Leading to the Procedure:   ACS: No.  Non-ACS:    Anginal Classification: CCS II  Anti-ischemic medical therapy: Maximal Therapy (2 or more classes of medications)  Non-Invasive Test Results: High-risk stress test findings: cardiac mortality >3%/year  Prior CABG: Prior CABG   Risks of atherectomy explained to the patient.  Low EF likely from ischemia in the left main.  Plan for PCI later today.  Did well post PCI of SVG.    History and Physical Interval Note:  05/16/2021 9:26 AM  [name]  has presented today for surgery, with the diagnosis of CAD.  The various methods of treatment have been discussed with the patient and family. After consideration of risks, benefits and other options for treatment, the patient has consented to  Procedure(s): CORONARY ATHERECTOMY (N/A) as a surgical intervention.  The patient's history has been reviewed, patient examined, no change in status, stable for surgery.  I have reviewed the patient's chart and labs.  Questions were answered to the patient's satisfaction.     Lance Muss

## 2021-05-16 NOTE — Discharge Instructions (Signed)
Radial Site Care  This sheet gives you information about how to care for yourself after your procedure. Your health care provider may also give you more specific instructions. If you have problems or questions, contact your health care provider. What can I expect after the procedure? After the procedure, it is common to have: Bruising and tenderness at the catheter insertion area. Follow these instructions at home: Medicines Take over-the-counter and prescription medicines only as told by your health care provider. Insertion site care Follow instructions from your health care provider about how to take care of your insertion site. Make sure you: Wash your hands with soap and water before you remove your bandage (dressing). If soap and water are not available, use hand sanitizer. May remove dressing in 24 hours. Check your insertion site every day for signs of infection. Check for: Redness, swelling, or pain. Fluid or blood. Pus or a bad smell. Warmth. Do no take baths, swim, or use a hot tub for 5 days. You may shower 24-48 hours after the procedure. Remove the dressing and gently wash the site with plain soap and water. Pat the area dry with a clean towel. Do not rub the site. That could cause bleeding. Do not apply powder or lotion to the site. Activity  For 24 hours after the procedure, or as directed by your health care provider: Do not flex or bend the affected arm. Do not push or pull heavy objects with the affected arm. Do not drive yourself home from the hospital or clinic. You may drive 24 hours after the procedure. Do not operate machinery or power tools. KEEP ARM ELEVATED THE REMAINDER OF THE DAY. Do not push, pull or lift anything that is heavier than 10 lb for 5 days. Ask your health care provider when it is okay to: Return to work or school. Resume usual physical activities or sports. Resume sexual activity. General instructions If the catheter site starts to  bleed, raise your arm and put firm pressure on the site. If the bleeding does not stop, get help right away. This is a medical emergency. DRINK PLENTY OF FLUIDS FOR THE NEXT 2-3 DAYS. No alcohol consumption for 24 hours after receiving sedation. If you went home on the same day as your procedure, a responsible adult should be with you for the first 24 hours after you arrive home. Keep all follow-up visits as told by your health care provider. This is important. Contact a health care provider if: You have a fever. You have redness, swelling, or yellow drainage around your insertion site. Get help right away if: You have unusual pain at the radial site. The catheter insertion area swells very fast. The insertion area is bleeding, and the bleeding does not stop when you hold steady pressure on the area. Your arm or hand becomes pale, cool, tingly, or numb. These symptoms may represent a serious problem that is an emergency. Do not wait to see if the symptoms will go away. Get medical help right away. Call your local emergency services (911 in the [place] Do not drive yourself to the hospital. Summary After the procedure, it is common to have bruising and tenderness at the site. Follow instructions from your health care provider about how to take care of your radial site wound. Check the wound every day for signs of infection.  This information is not intended to replace advice given to you by your health care provider. Make sure you discuss any questions you have with   your health care provider. Document Revised: 07/23/2017 Document Reviewed: 07/23/2017 Elsevier Patient Education  2020 Elsevier Inc.  

## 2021-05-17 ENCOUNTER — Encounter (HOSPITAL_COMMUNITY): Payer: Self-pay | Admitting: Interventional Cardiology

## 2021-05-17 LAB — POCT ACTIVATED CLOTTING TIME
Activated Clotting Time: 260 seconds
Activated Clotting Time: 248 seconds
Activated Clotting Time: 694 seconds
Activated Clotting Time: 243 seconds

## 2021-05-21 ENCOUNTER — Ambulatory Visit: Payer: Federal, State, Local not specified - PPO | Admitting: Student

## 2021-05-22 ENCOUNTER — Telehealth (HOSPITAL_COMMUNITY): Payer: Self-pay | Admitting: *Deleted

## 2021-05-22 ENCOUNTER — Telehealth: Payer: Self-pay | Admitting: Interventional Cardiology

## 2021-05-22 NOTE — Telephone Encounter (Signed)
Wife called in to say to use [facility] for the new medication that is needed for the patient. Inform her I dont see any new medication on file but she said it supposed happen the day of surgery. Please advise

## 2021-05-22 NOTE — Telephone Encounter (Signed)
Patient hospital admission for 11/29 approved via [employer]. Reference # [id number]

## 2021-05-22 NOTE — Telephone Encounter (Signed)
Wife notified prescription for dofetilide will not be sent to local pharmacy until after discharge and 1 week afib clinic follow up. Wife verbalized understanding.

## 2021-05-28 ENCOUNTER — Other Ambulatory Visit: Payer: Self-pay | Admitting: Internal Medicine

## 2021-05-28 LAB — SARS CORONAVIRUS 2 (TAT 6-24 HRS): SARS Coronavirus 2: NEGATIVE

## 2021-05-29 ENCOUNTER — Other Ambulatory Visit: Payer: Self-pay

## 2021-05-29 ENCOUNTER — Ambulatory Visit (HOSPITAL_COMMUNITY)
Admission: RE | Admit: 2021-05-29 | Discharge: 2021-05-29 | Disposition: A | Discharge status: Home / Self Care | Payer: Federal, State, Local not specified - PPO | Source: Ambulatory Visit | Attending: Physician Assistant | Admitting: Physician Assistant

## 2021-05-29 ENCOUNTER — Encounter (HOSPITAL_COMMUNITY): Payer: Self-pay | Admitting: Physician Assistant

## 2021-05-29 ENCOUNTER — Encounter (HOSPITAL_COMMUNITY)
Admission: RE | Disposition: A | Discharge status: Home / Self Care | Payer: Self-pay | Source: Home / Self Care | Attending: Internal Medicine

## 2021-05-29 ENCOUNTER — Ambulatory Visit (HOSPITAL_COMMUNITY): Payer: Federal, State, Local not specified - PPO

## 2021-05-29 ENCOUNTER — Ambulatory Visit (HOSPITAL_COMMUNITY): Payer: Federal, State, Local not specified - PPO | Admitting: Anesthesiology

## 2021-05-29 ENCOUNTER — Encounter (HOSPITAL_COMMUNITY): Payer: Self-pay | Admitting: Internal Medicine

## 2021-05-29 ENCOUNTER — Inpatient Hospital Stay (HOSPITAL_COMMUNITY)
Admission: RE | Admit: 2021-05-29 | Discharge: 2021-06-01 | DRG: 309 | Disposition: A | Discharge status: Home / Self Care | Payer: Federal, State, Local not specified - PPO | Attending: Internal Medicine | Admitting: Internal Medicine

## 2021-05-29 VITALS — BP 110/70 | HR 87 | Ht 72.0 in | Wt 235.0 lb

## 2021-05-29 DIAGNOSIS — I25119 Atherosclerotic heart disease of native coronary artery with unspecified angina pectoris: Secondary | ICD-10-CM | POA: Diagnosis not present

## 2021-05-29 DIAGNOSIS — I2510 Atherosclerotic heart disease of native coronary artery without angina pectoris: Secondary | ICD-10-CM | POA: Diagnosis not present

## 2021-05-29 DIAGNOSIS — E669 Obesity, unspecified: Secondary | ICD-10-CM | POA: Diagnosis present

## 2021-05-29 DIAGNOSIS — I4891 Unspecified atrial fibrillation: Secondary | ICD-10-CM | POA: Diagnosis not present

## 2021-05-29 DIAGNOSIS — I10 Essential (primary) hypertension: Secondary | ICD-10-CM | POA: Diagnosis not present

## 2021-05-29 DIAGNOSIS — I4819 Other persistent atrial fibrillation: Secondary | ICD-10-CM | POA: Diagnosis not present

## 2021-05-29 DIAGNOSIS — Z7902 Long term (current) use of antithrombotics/antiplatelets: Secondary | ICD-10-CM | POA: Diagnosis not present

## 2021-05-29 DIAGNOSIS — Z20822 Contact with and (suspected) exposure to covid-19: Secondary | ICD-10-CM | POA: Diagnosis not present

## 2021-05-29 DIAGNOSIS — I110 Hypertensive heart disease with heart failure: Secondary | ICD-10-CM | POA: Diagnosis not present

## 2021-05-29 DIAGNOSIS — I252 Old myocardial infarction: Secondary | ICD-10-CM | POA: Diagnosis not present

## 2021-05-29 DIAGNOSIS — E119 Type 2 diabetes mellitus without complications: Secondary | ICD-10-CM | POA: Diagnosis present

## 2021-05-29 DIAGNOSIS — I5042 Chronic combined systolic (congestive) and diastolic (congestive) heart failure: Secondary | ICD-10-CM | POA: Diagnosis not present

## 2021-05-29 DIAGNOSIS — D6869 Other thrombophilia: Secondary | ICD-10-CM | POA: Insufficient documentation

## 2021-05-29 DIAGNOSIS — Z6831 Body mass index (BMI) 31.0-31.9, adult: Secondary | ICD-10-CM | POA: Diagnosis not present

## 2021-05-29 DIAGNOSIS — Z87891 Personal history of nicotine dependence: Secondary | ICD-10-CM

## 2021-05-29 DIAGNOSIS — Z8249 Family history of ischemic heart disease and other diseases of the circulatory system: Secondary | ICD-10-CM

## 2021-05-29 DIAGNOSIS — Z951 Presence of aortocoronary bypass graft: Secondary | ICD-10-CM | POA: Diagnosis not present

## 2021-05-29 DIAGNOSIS — Z79899 Other long term (current) drug therapy: Secondary | ICD-10-CM | POA: Diagnosis not present

## 2021-05-29 DIAGNOSIS — Z7984 Long term (current) use of oral hypoglycemic drugs: Secondary | ICD-10-CM

## 2021-05-29 DIAGNOSIS — E785 Hyperlipidemia, unspecified: Secondary | ICD-10-CM | POA: Diagnosis present

## 2021-05-29 DIAGNOSIS — K219 Gastro-esophageal reflux disease without esophagitis: Secondary | ICD-10-CM | POA: Diagnosis not present

## 2021-05-29 DIAGNOSIS — I5022 Chronic systolic (congestive) heart failure: Secondary | ICD-10-CM | POA: Diagnosis not present

## 2021-05-29 DIAGNOSIS — Z7901 Long term (current) use of anticoagulants: Secondary | ICD-10-CM

## 2021-05-29 DIAGNOSIS — E7800 Pure hypercholesterolemia, unspecified: Secondary | ICD-10-CM | POA: Diagnosis present

## 2021-05-29 DIAGNOSIS — Z955 Presence of coronary angioplasty implant and graft: Secondary | ICD-10-CM | POA: Diagnosis not present

## 2021-05-29 HISTORY — PX: BUBBLE STUDY: SHX6837

## 2021-05-29 HISTORY — PX: TEE WITHOUT CARDIOVERSION: SHX5443

## 2021-05-29 HISTORY — PX: CARDIOVERSION: SHX1299

## 2021-05-29 LAB — CBC
HCT: 45.1 % (ref 39.0–52.0)
Hemoglobin: 14.6 g/dL (ref 13.0–17.0)
MCH: 29.4 pg (ref 26.0–34.0)
MCHC: 32.4 g/dL (ref 30.0–36.0)
MCV: 90.7 fL (ref 80.0–100.0)
Platelets: 210 10*3/uL (ref 150–400)
RBC: 4.97 MIL/uL (ref 4.22–5.81)
RDW: 14.5 % (ref 11.5–15.5)
WBC: 9.9 10*3/uL (ref 4.0–10.5)
nRBC: 0 % (ref 0.0–0.2)

## 2021-05-29 LAB — BASIC METABOLIC PANEL
Anion gap: 9 (ref 5–15)
Anion gap: 7 (ref 5–15)
BUN: 20 mg/dL (ref 6–20)
BUN: 19 mg/dL (ref 6–20)
CO2: 24 mmol/L (ref 22–32)
CO2: 27 mmol/L (ref 22–32)
Calcium: 9.6 mg/dL (ref 8.9–10.3)
Calcium: 9.5 mg/dL (ref 8.9–10.3)
Chloride: 104 mmol/L (ref 98–111)
Chloride: 103 mmol/L (ref 98–111)
Creatinine, Ser: 1.04 mg/dL (ref 0.61–1.24)
Creatinine, Ser: 1.05 mg/dL (ref 0.61–1.24)
GFR, Estimated: >60 mL/min (ref >60)
GFR, Estimated: >60 mL/min (ref >60)
Glucose, Bld: 190 mg/dL — ABNORMAL HIGH (ref 70–99)
Glucose, Bld: 203 mg/dL — ABNORMAL HIGH (ref 70–99)
Potassium: 4.2 mmol/L (ref 3.5–5.1)
Potassium: 4 mmol/L (ref 3.5–5.1)
Sodium: 137 mmol/L (ref 135–145)
Sodium: 137 mmol/L (ref 135–145)

## 2021-05-29 LAB — MAGNESIUM
Magnesium: 2.1 mg/dL (ref 1.7–2.4)
Magnesium: 1.9 mg/dL (ref 1.7–2.4)

## 2021-05-29 LAB — GLUCOSE, CAPILLARY
Glucose-Capillary: 169 mg/dL — ABNORMAL HIGH (ref 70–99)
Glucose-Capillary: 143 mg/dL — ABNORMAL HIGH (ref 70–99)

## 2021-05-29 LAB — HIV ANTIBODY (ROUTINE TESTING W REFLEX): HIV Screen 4th Generation wRfx: NONREACTIVE

## 2021-05-29 SURGERY — ECHOCARDIOGRAM, TRANSESOPHAGEAL
Anesthesia: General

## 2021-05-29 MED ORDER — APIXABAN 5 MG PO TABS
5.0000 mg | ORAL_TABLET | Freq: Two times a day (BID) | ORAL | Status: DC
  Administered 2021-05-30 – 2021-06-01 (×5): 5 mg via ORAL
  Filled 2021-05-29 (×5): qty 1

## 2021-05-29 MED ORDER — PROPOFOL 10 MG/ML IV BOLUS
INTRAVENOUS | Status: DC | PRN
  Administered 2021-05-29: 20 mg via INTRAVENOUS

## 2021-05-29 MED ORDER — MIDAZOLAM HCL 5 MG/5ML IJ SOLN
INTRAMUSCULAR | Status: DC | PRN
  Administered 2021-05-29: 2 mg via INTRAVENOUS

## 2021-05-29 MED ORDER — FERROUS SULFATE 325 (65 FE) MG PO TABS
325.0000 mg | ORAL_TABLET | Freq: Every day | ORAL | Status: DC
  Administered 2021-05-29 – 2021-06-01 (×4): 325 mg via ORAL
  Filled 2021-05-29 (×3): qty 1

## 2021-05-29 MED ORDER — APIXABAN 5 MG PO TABS
ORAL_TABLET | ORAL | Status: AC
  Administered 2021-05-29: 5 mg via ORAL
  Filled 2021-05-29: qty 1

## 2021-05-29 MED ORDER — SODIUM CHLORIDE 0.9 % IV SOLN: INTRAVENOUS | Status: DC

## 2021-05-29 MED ORDER — NITROGLYCERIN 0.4 MG SL SUBL: 0.4000 mg | SUBLINGUAL_TABLET | SUBLINGUAL | Status: DC | PRN

## 2021-05-29 MED ORDER — SODIUM CHLORIDE 0.9 % IV SOLN: 250.0000 mL | INTRAVENOUS | Status: DC | PRN

## 2021-05-29 MED ORDER — ATORVASTATIN CALCIUM 80 MG PO TABS
ORAL_TABLET | ORAL | Status: AC
  Administered 2021-05-29: 80 mg via ORAL
  Filled 2021-05-29: qty 1

## 2021-05-29 MED ORDER — NEBIVOLOL HCL 10 MG PO TABS
20.0000 mg | ORAL_TABLET | ORAL | Status: DC
  Administered 2021-05-30 – 2021-06-01 (×3): 20 mg via ORAL
  Filled 2021-05-29 (×3): qty 2

## 2021-05-29 MED ORDER — ISOSORBIDE MONONITRATE ER 30 MG PO TB24
30.0000 mg | ORAL_TABLET | Freq: Every morning | ORAL | Status: DC
  Administered 2021-05-30 – 2021-06-01 (×3): 30 mg via ORAL
  Filled 2021-05-29 (×3): qty 1

## 2021-05-29 MED ORDER — DAPAGLIFLOZIN PROPANEDIOL 10 MG PO TABS
10.0000 mg | ORAL_TABLET | Freq: Every morning | ORAL | Status: DC
  Administered 2021-05-30 – 2021-06-01 (×3): 10 mg via ORAL
  Filled 2021-05-29 (×3): qty 1

## 2021-05-29 MED ORDER — SODIUM CHLORIDE 0.9% FLUSH: 3.0000 mL | INTRAVENOUS | Status: DC | PRN

## 2021-05-29 MED ORDER — DOFETILIDE 500 MCG PO CAPS
ORAL_CAPSULE | ORAL | Status: AC
  Filled 2021-05-29: qty 1

## 2021-05-29 MED ORDER — LOSARTAN POTASSIUM 50 MG PO TABS
50.0000 mg | ORAL_TABLET | Freq: Every evening | ORAL | Status: DC
  Administered 2021-05-29 – 2021-05-31 (×3): 50 mg via ORAL
  Filled 2021-05-29 (×2): qty 1

## 2021-05-29 MED ORDER — METFORMIN HCL 500 MG PO TABS
1000.0000 mg | ORAL_TABLET | Freq: Two times a day (BID) | ORAL | Status: DC
  Administered 2021-05-29 – 2021-06-01 (×6): 1000 mg via ORAL
  Filled 2021-05-29 (×5): qty 2

## 2021-05-29 MED ORDER — PROPOFOL 500 MG/50ML IV EMUL: INTRAVENOUS | Status: DC | PRN

## 2021-05-29 MED ORDER — PROPOFOL 500 MG/50ML IV EMUL
INTRAVENOUS | Status: DC | PRN
  Administered 2021-05-29: 100 ug/kg/min via INTRAVENOUS

## 2021-05-29 MED ORDER — SODIUM CHLORIDE 0.9% FLUSH
3.0000 mL | Freq: Two times a day (BID) | INTRAVENOUS | Status: DC
  Administered 2021-05-29 – 2021-06-01 (×5): 3 mL via INTRAVENOUS

## 2021-05-29 MED ORDER — CLOPIDOGREL BISULFATE 75 MG PO TABS
75.0000 mg | ORAL_TABLET | Freq: Every day | ORAL | Status: DC
  Administered 2021-05-30 – 2021-06-01 (×3): 75 mg via ORAL
  Filled 2021-05-29 (×3): qty 1

## 2021-05-29 MED ORDER — LINAGLIPTIN 5 MG PO TABS
5.0000 mg | ORAL_TABLET | Freq: Every day | ORAL | Status: DC
  Administered 2021-05-29 – 2021-06-01 (×4): 5 mg via ORAL
  Filled 2021-05-29 (×3): qty 1

## 2021-05-29 MED ORDER — GLIMEPIRIDE 4 MG PO TABS
4.0000 mg | ORAL_TABLET | Freq: Every day | ORAL | Status: DC
  Administered 2021-05-30 – 2021-06-01 (×3): 4 mg via ORAL
  Filled 2021-05-29 (×3): qty 1

## 2021-05-29 MED ORDER — POTASSIUM CHLORIDE CRYS ER 20 MEQ PO TBCR
20.0000 meq | EXTENDED_RELEASE_TABLET | Freq: Every day | ORAL | Status: DC
  Administered 2021-05-29 – 2021-06-01 (×4): 20 meq via ORAL
  Filled 2021-05-29 (×3): qty 1

## 2021-05-29 MED ORDER — ATORVASTATIN CALCIUM 80 MG PO TABS
80.0000 mg | ORAL_TABLET | Freq: Every day | ORAL | Status: DC
  Administered 2021-05-30 – 2021-05-31 (×2): 80 mg via ORAL
  Filled 2021-05-29 (×2): qty 1

## 2021-05-29 MED ORDER — ADULT MULTIVITAMIN W/MINERALS CH
1.0000 | ORAL_TABLET | Freq: Two times a day (BID) | ORAL | Status: DC
  Administered 2021-05-30 – 2021-06-01 (×6): 1 via ORAL
  Filled 2021-05-29 (×7): qty 1

## 2021-05-29 MED ORDER — DOFETILIDE 500 MCG PO CAPS
500.0000 ug | ORAL_CAPSULE | Freq: Two times a day (BID) | ORAL | Status: DC
  Administered 2021-05-29 – 2021-06-01 (×6): 500 ug via ORAL
  Filled 2021-05-29 (×5): qty 1

## 2021-05-29 MED ORDER — FUROSEMIDE 40 MG PO TABS
40.0000 mg | ORAL_TABLET | Freq: Every day | ORAL | Status: DC
  Administered 2021-05-29 – 2021-06-01 (×4): 40 mg via ORAL
  Filled 2021-05-29 (×3): qty 1

## 2021-05-29 NOTE — Transfer of Care (Signed)
Immediate Anesthesia Transfer of Care Note  Patient: [name]  Procedure(s) Performed: TRANSESOPHAGEAL ECHOCARDIOGRAM (TEE) CARDIOVERSION BUBBLE STUDY  Patient Location: PACU and Endoscopy Unit  Anesthesia Type:MAC  Level of Consciousness: drowsy  Airway & Oxygen Therapy: Patient Spontanous Breathing  Post-op Assessment: Report given to RN and Post -op Vital signs reviewed and stable  Post vital signs: Reviewed and stable  Last Vitals:  Vitals Value Taken Time  BP 100/62 05/29/21 1103  Temp    Pulse 66 05/29/21 1103  Resp 15 05/29/21 1103  SpO2 94 % 05/29/21 1103  Vitals shown include unvalidated device data.  Last Pain:  Vitals:   05/29/21 0924  TempSrc: Oral  PainSc: 0-No pain         Complications: No notable events documented.

## 2021-05-29 NOTE — Interval H&P Note (Signed)
History and Physical Interval Note:  05/29/2021 10:16 AM  [name]  has presented today for surgery, with the diagnosis of AFIB.  The various methods of treatment have been discussed with the patient and family. After consideration of risks, benefits and other options for treatment, the patient has consented to  Procedure(s): TRANSESOPHAGEAL ECHOCARDIOGRAM (TEE) (N/A) CARDIOVERSION (N/A) as a surgical intervention.  The patient's history has been reviewed, patient examined, no change in status, stable for surgery.  I have reviewed the patient's chart and labs.  Questions were answered to the patient's satisfaction.     [name]

## 2021-05-29 NOTE — Anesthesia Preprocedure Evaluation (Signed)
Anesthesia Evaluation  Patient identified by MRN, date of birth, ID band Patient awake    Reviewed: Allergy & Precautions, NPO status , Patient's Chart, lab work & pertinent test results  Airway Mallampati: I       Dental no notable dental hx. (+) Teeth Intact   Pulmonary former smoker,    Pulmonary exam normal        Cardiovascular hypertension, Pt. on medications and Pt. on home beta blockers + CAD, + Past MI and +CHF  + dysrhythmias Atrial Fibrillation  Rhythm:Irregular Rate:Normal     Neuro/Psych negative psych ROS   GI/Hepatic Neg liver ROS,   Endo/Other  diabetes, Type 2, Oral Hypoglycemic Agents  Renal/GU negative Renal ROS  negative genitourinary   Musculoskeletal negative musculoskeletal ROS (+)   Abdominal (+) + obese,   Peds  Hematology   Anesthesia Other Findings RONNAL STOOPS  CARDIAC CATHETERIZATION  Order# [id number]  Reading physician: Jettie Booze, MD Ordering physician: Jettie Booze, MD Study date: 09/25/16 Physicians   Panel Physicians Referring Physician Case Authorizing Physician Jettie Booze, MD (Primary)   Procedures   Left Heart Cath and Cors/Grafts Angiography Conclusion     Ost LM to LM lesion, 60 %stenosed.  Mid LAD lesion, 60 %stenosed. Patent LIMA to LAD.  Patent SVG to diagonal.  Patent SVG to moderately disease OM. The OM is patent back to the circumflex and provides some flow to the circumflex.  RPDA lesion, 80 %stenosed. Patent SVG to PDA. Early bifurcation of PDA, PLA.  The left ventricular systolic function is normal.  LV end diastolic pressure is mildly elevated.  There is no aortic valve stenosis.    Left main lesion could be intervened upon to improve flow to the circumflex, but this may disrupt flow in the other grafts.  Therefore, this was deferred in favor of medical therapy.  No clear culprit lesion for acute presentation. Negative  enzymes at this point as well.    Continue aggressive medical therapy.    [facility]  2D Echocardiogram without contrast  Order# [id number]  Ordering physician: [name] Klein, MD Study date: 04/04/14 Result Notes for 2D Echocardiogram without contrast   Notes Recorded by Tressa Busman, CMA on 04/05/2014 at 11:34 AM Echo results discussed with patient. Voiced understanding. ------  Notes Recorded by [name] Klein, MD on 04/04/2014 at 6:05 PM Echo looks pretty good. The left atrium is only mildly dilated - he could be a good candidate for ablation.   Study Result   Result status: Edited Result - FINAL         *[facility], [place], [place] [address]              [phone]  ------------------------------------------------------------------- Transthoracic Echocardiography  (Report amended )  Patient:  [name], [name] MR #:    [id number] Study Date: 04/04/2014 Gender:   M Age:    60 Height:   185.4 cm Weight:   119.3 kg BSA:    2.52 m^2 Pt. Status: Room:  ORDERING   [name] Klein, MD REFERRING  [name] Klein, MD ATTENDING  Glenetta Hew SONOGRAPHER Marygrace Drought, RCS PERFORMING  [facility], Outpatient  cc:  ------------------------------------------------------------------- LV EF: 50% -  55%  ------------------------------------------------------------------- Indications:   427.31 Atrial Fibrillation.  ------------------------------------------------------------------- Study Conclusions  - Left ventricle: The cavity size was normal. Wall  thickness was normal. Systolic function was normal. The estimated ejection fraction was in the range of 50% to 55%. - Mitral valve: There was mild regurgitation. - Left atrium: The atrium was mildly dilated.  Transthoracic echocardiography. M-mode, complete 2D,  spectral Doppler, and color Doppler. Birthdate: Patient birthdate: 1961/07/01. Age: Patient is 60 yr old. Sex: Gender: male. BMI: 34.7 kg/m^2. Blood pressure:   132/86 Patient status: Inpatient. Study date: Study date: 04/04/2014. Study time: 08:23 AM. Location: Echo laboratory.  -------------------------------------------------------------------  ------------------------------------------------------------------- Left ventricle: The cavity size was normal. Wall thickness was normal. Systolic function was normal. The estimated ejection fraction was in the range of 50% to 55%.  ------------------------------------------------------------------- Aortic valve:  Moderately thickened, mildly calcified leaflets. Doppler: There was no significant regurgitation.  ------------------------------------------------------------------- Mitral valve:  Moderately thickened, noncalcified leaflets . Doppler: There was mild regurgitation.  Peak gradient (D): 4 mm Hg.  ------------------------------------------------------------------- Left atrium: LA volume/ BSA = 33.5 ml/m2. The atrium was mildly dilated.  ------------------------------------------------------------------- Right ventricle: The cavity size was normal. Wall thickness was normal. Systolic function was normal.  ------------------------------------------------------------------- Pulmonic valve:  Poorly visualized. Doppler: There was trivial regurgitation.  ------------------------------------------------------------------- Tricuspid valve:  Structurally normal valve.  Leaflet separation was normal. Doppler: Transvalvular velocity was within the normal range. There was mild regurgitation.  ------------------------------------------------------------------- Right atrium: The atrium was normal in size.  ------------------------------------------------------------------- Pericardium: A trivial  pericardial effusion was identified.  ------------------------------------------------------------------- Systemic veins: Inferior vena cava: The vessel was normal in size. The respirophasic diameter changes were in the normal range (= 50%), consistent with normal central venous pressure. Diameter: 19 mm.  ------------------------------------------------------------------- Measurements  IVC                  Value    Reference ID                   19  mm   ---------  Left ventricle             Value    Reference LV ID, ED, PLAX chordal        47.2 mm   43 - 52 LV ID, ES, PLAX chordal        36.3 mm   23 - 38 LV fx shortening, PLAX chordal (L)   23  %   >=29 LV PW thickness, ED          13.5 mm   --------- IVS/LV PW ratio, ED          0.65     <=1.3 Stroke volume, 2D           64  ml   --------- Stroke volume/bsa, 2D         25  ml/m^2 --------- LV e&', lateral             10.9 cm/s  --------- LV E/e&', lateral            9.17     --------- LV e&', medial             13.3 cm/s  --------- LV E/e&', medial            7.52     --------- LV e&', average             12.1 cm/s  --------- LV E/e&', average            8.26     ---------  Ventricular septum           Value  Reference IVS thickness, ED           8.77 mm   ---------  LVOT                  Value    Reference LVOT ID, S               22  mm   --------- LVOT area               3.8  cm^2  --------- LVOT peak velocity, S         82  cm/s  --------- LVOT mean velocity, S         59.5 cm/s  --------- LVOT VTI, S              16.9 cm   ---------  Aorta                  Value    Reference Aortic root ID, ED           39  mm   ---------  Left atrium              Value    Reference LA ID, A-P, ES             56  mm   --------- LA ID/bsa, A-P         (H)   2.22 cm/m^2 <=2.2 LA volume, ES, 1-p A4C         76  ml   --------- LA volume/bsa, ES, 1-p A4C       30.2 ml/m^2 --------- LA volume, ES, 1-p A2C         85  ml   --------- LA volume/bsa, ES, 1-p A2C       33.8 ml/m^2 ---------  Mitral valve              Value    Reference Mitral E-wave peak velocity      100  cm/s  --------- Mitral deceleration time        176  ms   150 - 230 Mitral peak gradient, D        4   mm Hg ---------  Pulmonary arteries           Value    Reference PA pressure, S, DP           19  mm Hg <=30  Tricuspid valve            Value    Reference Tricuspid regurg peak velocity     198  cm/s  --------- Tricuspid peak RV-RA gradient     16  mm Hg ---------  Systemic veins             Value    Reference Estimated CVP             3   mm Hg ---------  Right ventricle            Value    Reference RV pressure, S, DP           19  mm Hg <=30 RV s&', lateral, S           10.3 cm/s  ---------  Legend: (L) and (H) mark values outside specified reference range.  ------------------------------------------------------------------- Vilinda Blanks, M.D. 2015-10-05T15:41:23 Patient Information   Patient Name Talha, Quinley Sex Male DOB Apr 13, 1961 SSN [id number] Reason for Exam  Priority: Routine  Dx: Atrial fibrillation, unspecified (I48.91 (ICD-10-CM)) Surgical History   Surgical History     Procedure Laterality Date Comment Source CARDIAC CATHETERIZATION  2008  Provider CORONARY ANGIOPLASTY  1996  Provider CORONARY ARTERY BYPASS GRAFT  2008 LIMA-LAD, SVG-Dx, SVG-OM1, SVG-PDA Provider  Other Surgical History    Procedure Laterality Date Comment Source LEFT HEART CATH AND CORS/GRAFTS ANGIOGRAPHY N/A 09/25/2016 Procedure: Left Heart Cath and Cors/Grafts Angiography; Surgeon: Jettie Booze, MD; Location: [facility]; Service: Cardiovascular; Laterality: N/A; Provider  Performing Technologist/Nurse   Performing Technologist/Nurse:  Implants    No active implants to display in this view. Order-Level Documents:   There are no order-level documents.  Encounter-Level Documents - 04/04/2014:   Scan on 04/16/2014 9:47 AM by Default, Provider, MDScan on 04/16/2014 9:47 AM by Default, Provider, MD  Electronic signature on 04/04/2014 8:06 AM: [facility]/nl/tst - Signed  Electronic signature on 04/04/2014 8:05 AM: [facility]/nl/tst - Signed    Printable Result Report    Result Report      Reproductive/Obstetrics                             Anesthesia Physical  Anesthesia Plan  ASA: III  Anesthesia Plan: General   Post-op Pain Management:    Induction: Intravenous  PONV Risk Score and Plan: 2 and Ondansetron and Midazolam  Airway Management Planned: Mask  Additional Equipment: None  Intra-op Plan:   Post-operative Plan: Extubation in OR  Informed Consent: I have reviewed the patients History and Physical, chart, labs and discussed the procedure including the risks, benefits and alternatives for the proposed anesthesia with the patient or authorized representative who has indicated his/her understanding and acceptance.       Plan Discussed with: CRNA  Anesthesia Plan Comments:         Anesthesia Quick Evaluation

## 2021-05-29 NOTE — Progress Notes (Signed)
  Echocardiogram Echocardiogram Transesophageal has been performed.  Delcie Roch 05/29/2021, 11:08 AM

## 2021-05-29 NOTE — Progress Notes (Signed)
Pharmacy: Dofetilide (Tikosyn) - Initial Consult Assessment and Electrolyte Replacement  Pharmacy consulted to assist in monitoring and replacing electrolytes in this 60 y.o. male admitted on 05/29/2021 undergoing dofetilide initiation. First dofetilide dose: 05/29/21  Assessment:  Patient Exclusion Criteria: If any screening criteria checked as "Yes", then  patient  should NOT receive dofetilide until criteria item is corrected.  If "Yes" please indicate correction plan.  YES  NO Patient  Exclusion Criteria Correction Plan   []   [x]   Baseline QTc interval is greater than or equal to 440 msec. IF above YES box checked dofetilide contraindicated unless patient has ICD; then may proceed if QTc 500-550 msec or with known ventricular conduction abnormalities may proceed with QTc 550-600 msec. QTc = 430    []   [x]   Patient is known or suspected to have a digoxin level greater than 2 ng/ml: No results found for: DIGOXIN     []   [x]   Creatinine clearance less than 20 ml/min (calculated using Cockcroft-Gault, actual body weight and serum creatinine): Estimated Creatinine Clearance: 96.5 mL/min (by C-G formula based on SCr of 1.04 mg/dL).     []   [x]  Patient has received drugs known to prolong the QT intervals within the last 48 hours (phenothiazines, tricyclics or tetracyclic antidepressants, erythromycin, H-1 antihistamines, cisapride, fluoroquinolones, azithromycin, ondansetron).   Updated information on QT prolonging agents is available to be searched on the following database:QT prolonging agents     []   [x]   Patient received a dose of hydrochlorothiazide (Oretic) alone or in any combination including triamterene (Dyazide, Maxzide) in the last 48 hours.    []   [x]  Patient received a medication known to increase dofetilide plasma concentrations prior to initial dofetilide dose:  Trimethoprim (Primsol, Proloprim) in the last 36 hours Verapamil (Calan, Verelan) in the last 36 hours  or a sustained release dose in the last 72 hours Megestrol (Megace) in the last 5 days  Cimetidine (Tagamet) in the last 6 hours Ketoconazole (Nizoral) in the last 24 hours Itraconazole (Sporanox) in the last 48 hours  Prochlorperazine (Compazine) in the last 36 hours     []   [x]   Patient is known to have a history of torsades de pointes; congenital or acquired long QT syndromes.    []   [x]   Patient has received a Class 1 antiarrhythmic with less than 2 half-lives since last dose. (Disopyramide, Quinidine, Procainamide, Lidocaine, Mexiletine, Flecainide, Propafenone)    []   [x]   Patient has received amiodarone therapy in the past 3 months or amiodarone level is greater than 0.3 ng/ml.    Patient has been appropriately anticoagulated with apixaban 5mg  BID.  Labs:    Component Value Date/Time   K 4.2 05/29/2021 0842   MG 2.1 05/29/2021 0842     Plan: Potassium: K >/= 4: Appropriate to initiate Tikosyn, no replacement needed    Magnesium: Mg >2: Appropriate to initiate Tikosyn, no replacement needed     Thank you for allowing pharmacy to participate in this patient's care   05/29/2021  1:02 PM

## 2021-05-29 NOTE — Plan of Care (Signed)

## 2021-05-29 NOTE — CV Procedure (Signed)
TEE/CARDIOVERSION NOTE  TRANSESOPHAGEAL ECHOCARDIOGRAM (TEE):  Indictation: Atrial Fibrillation  Consent:   Informed consent was obtained prior to the procedure. The risks, benefits and alternatives for the procedure were discussed and the patient comprehended these risks.  Risks include, but are not limited to, cough, sore throat, vomiting, nausea, somnolence, esophageal and stomach trauma or perforation, bleeding, low blood pressure, aspiration, pneumonia, infection, trauma to the teeth and death.    Time Out: Verified patient identification, verified procedure, site/side was marked, verified correct patient position, special equipment/implants available, medications/allergies/relevent history reviewed, required imaging and test results available. Performed  Procedure:  After a procedural time-out, the patient was given propofol for moderate sedation. The patient's heart rate, blood pressure, and oxygen saturation are monitored continuously during the procedure. The oropharynx was anesthetized with topical cetacaine.  The transesophageal probe was inserted in the esophagus and stomach without difficulty and multiple views were obtained. Agitated microbubble saline contrast was administered.  Complications:    Complications: None Patient did tolerate procedure well.  Findings:  LEFT VENTRICLE: The left ventricular wall thickness is mildly increased.  The left ventricular cavity is normal in size. Wall motion is normal.  LVEF is 55-60%.  RIGHT VENTRICLE:  The right ventricle is normal in structure and function without any thrombus or masses.    LEFT ATRIUM:  The left atrium is mildly dilated in size without any thrombus or masses.  There is not spontaneous echo contrast ("smoke") in the left atrium consistent with a low flow state.  LEFT ATRIAL APPENDAGE:  The left atrial appendage is free of any thrombus or masses. The appendage has single lobes. Pulse doppler indicates low flow in  the appendage.  ATRIAL SEPTUM:  The atrial septum appears intact and is aneurysmal.  There is no evidence for interatrial shunting by color doppler and saline microbubble.  RIGHT ATRIUM:  The right atrium is mildly dilated in size and function without any thrombus or masses.  MITRAL VALVE:  The mitral valve is normal in structure and function with  trivial  regurgitation.  There were no vegetations or stenosis.  AORTIC VALVE:  The aortic valve is trileaflet, normal in structure and function with  no  regurgitation.  There were no vegetations or stenosis  TRICUSPID VALVE:  The tricuspid valve is normal in structure and function with  no  regurgitation.  There were no vegetations or stenosis   PULMONIC VALVE:  The pulmonic valve is normal in structure and function with  trivial  regurgitation.  There were no vegetations or stenosis.   AORTIC ARCH, ASCENDING AND DESCENDING AORTA:  There was no Myrtis Ser et. Al, 1992) atherosclerosis of the ascending aorta, aortic arch, or proximal descending aorta.  12. PULMONARY VEINS: Anomalous pulmonary venous return was not noted.  13. PERICARDIUM: The pericardium appeared normal and non-thickened.  There is no pericardial effusion.  CARDIOVERSION:     Second Time Out: Verified patient identification, verified procedure, site/side was marked, verified correct patient position, special equipment/implants available, medications/allergies/relevent history reviewed, required imaging and test results available.  Performed  Procedure:  Patient placed on cardiac monitor, pulse oximetry, supplemental oxygen as necessary.  Sedation administered per anesthesia Pacer pads placed anterior and posterior chest. Cardioverted 2 time(s).  Cardioverted at 150J and 200J biphasic.  Complications:  Complications: None Patient did tolerate procedure well.  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  Recommendations:  Continue with planned admission for dofetilide loading as per Dr. Rayann Heman.  Time Spent Directly with the Patient:  45 minutes   Pixie Casino, MD, [facility], [place] Director of the Advanced Lipid Disorders &  Cardiovascular Risk Reduction Clinic Diplomate of the American Board of Clinical Lipidology Attending Cardiologist  Direct Dial: [phone]  Fax: [phone]  Website:  [email or web address] 05/29/2021, 11:05 AM

## 2021-05-29 NOTE — H&P (Addendum)
Electrophysiology H&P  Note    Primary Care Physician: Gareth Morgan, MD Primary Cardiologist: Dr Royann Shivers  Primary Electrophysiologist: Dr Johney Frame Referring Physician: Dr Johney Frame   [name] is a 61 y.o. male with a history of HTN, CAD, chronic systolic CHF, DM, HLD, atrial fibrillation who presents for follow up in the [facility]. Patient is on Eliquis for a CHADS2VASC score of 4. He was seen by Dr Johney Frame 05/07/21 and had been in afib persistently for months. Echo showed new reduced EF at 30%. He also had PCI 05/16/21. Because of holding anticoagulation for LHC, ablation for afib was not pursued. Dofetilide was recommended with TEE DCCV prior.   Patient presented this am to AF clinic for planning for TEE/DCCV and dofetilide loading.   Examined post TEE/DCC with results below. No clot -> DCC -> NSR. Post DCC EKG shows NSR at 67 bpm with stable QTc 430-440  Awake and alert and feeling well post [facility].  Atrial Fibrillation Risk Factors:  he does not have symptoms or diagnosis of sleep apnea. he does not have a history of rheumatic fever. he does not have a history of alcohol use.   he has a BMI of Body mass index is 31.87 kg/m.Marland Kitchen Filed Weights   05/29/21 0924  Weight: 106.6 kg    Family History  Problem Relation Age of Onset   CAD Father    Vascular Disease Father        carotid artery disease     Atrial Fibrillation Management history:  Previous antiarrhythmic drugs: dofetilide  Previous cardioversions: none Previous ablations: 2019 CHADS2VASC score: 4 Anticoagulation history: Eliquis   Past Medical History:  Diagnosis Date   Abdominal distention    Anemia    Postoperative   Chronic diastolic CHF (congestive heart failure) (HCC)    Coronary artery disease    a. premature - acute MI age 69 s/p BMS to prox LAD. b. s/p DES to RCA, PLA, mLAD in 2007. c. 4V CABG in 2008 with LIMA to LAD, SVG to first diagonal, SVG to first OM, SVG to  PDA   Diabetes (HCC)    Edema    H/O gastroesophageal reflux (GERD)    High cholesterol    Hypertension    Myocardial infarction (HCC)    Obesity    Persistent atrial fibrillation (HCC)    a. on Tikosyn, Xarelto.   Pleural effusion    resolved with therapy   Past Surgical History:  Procedure Laterality Date   ABLATION OF DYSRHYTHMIC FOCUS  09/23/2017   ATRIAL FIBRILLATION ABLATION N/A 09/23/2017   Procedure: ATRIAL FIBRILLATION ABLATION;  Surgeon: Hillis Range, MD;  Location: [facility] INVASIVE CV LAB;  Service: Cardiovascular;  Laterality: N/A;   CARDIAC CATHETERIZATION  2008   CORONARY ANGIOPLASTY  1996   CORONARY ARTERY BYPASS GRAFT  2008   LIMA-LAD, SVG-Dx, SVG-OM1, SVG-PDA   CORONARY ATHERECTOMY N/A 05/16/2021   Procedure: CORONARY ATHERECTOMY;  Surgeon: Corky Crafts, MD;  Location: [facility] INVASIVE CV LAB;  Service: Cardiovascular;  Laterality: N/A;   CORONARY STENT INTERVENTION N/A 05/01/2021   Procedure: CORONARY STENT INTERVENTION;  Surgeon: Corky Crafts, MD;  Location: [facility] INVASIVE CV LAB;  Service: Cardiovascular;  Laterality: N/A;   CORONARY STENT INTERVENTION N/A 05/16/2021   Procedure: CORONARY STENT INTERVENTION;  Surgeon: Corky Crafts, MD;  Location: [facility] INVASIVE CV LAB;  Service: Cardiovascular;  Laterality: N/A;   INTRAVASCULAR ULTRASOUND/IVUS N/A 05/16/2021   Procedure: Intravascular Ultrasound/IVUS;  Surgeon: Corky Crafts,  MD;  Location: [place] CV LAB;  Service: Cardiovascular;  Laterality: N/A;   LEFT HEART CATH N/A 05/16/2021   Procedure: Left Heart Cath;  Surgeon: Jettie Booze, MD;  Location: [place] CV LAB;  Service: Cardiovascular;  Laterality: N/A;   LEFT HEART CATH AND CORS/GRAFTS ANGIOGRAPHY N/A 09/25/2016   Procedure: Left Heart Cath and Cors/Grafts Angiography;  Surgeon: Jettie Booze, MD;  Location: [place] CV LAB;  Service: Cardiovascular;  Laterality: N/A;   LEFT HEART CATH AND CORS/GRAFTS ANGIOGRAPHY N/A  05/01/2021   Procedure: LEFT HEART CATH AND CORS/GRAFTS ANGIOGRAPHY;  Surgeon: Jettie Booze, MD;  Location: [place] CV LAB;  Service: Cardiovascular;  Laterality: N/A;    Current Facility-Administered Medications  Medication Dose Route Frequency Provider Last Rate Last Admin   0.9 %  sodium chloride infusion   Intravenous Continuous Elai Vanwyk, MD       0.9 %  sodium chloride infusion   Intravenous Continuous Thompson Grayer, MD 20 mL/hr at 05/29/21 0941 Restarted at 05/29/21 1030   Facility-Administered Medications Ordered in Other Encounters  Medication Dose Route Frequency Provider Last Rate Last Admin   midazolam (VERSED) 5 MG/5ML injection   Intravenous Anesthesia Intra-op Vonna Drafts, CRNA   2 mg at 05/29/21 1030   propofol (DIPRIVAN) 10 mg/mL bolus/IV push   Intravenous Anesthesia Intra-op Vonna Drafts, CRNA   20 mg at 05/29/21 1045   propofol (DIPRIVAN) 500 MG/50ML infusion   Intravenous Continuous PRN Vonna Drafts, CRNA 70.356 mL/hr at 05/29/21 1046 110 mcg/kg/min at 05/29/21 1046    No Known Allergies  Social History   Socioeconomic History   Marital status: Married    Spouse name: Not on file   Number of children: Not on file   Years of education: Not on file   Highest education level: Not on file  Occupational History   Occupation: Regulatory affairs officer    Employer: [employer]   Occupation: Has cattle and Sales promotion account executive.  Tobacco Use   Smoking status: Former    Packs/day: 2.00    Years: 30.00    Pack years: 60.00    Types: Cigarettes   Smokeless tobacco: Never   Tobacco comments:    Former smoker 05/29/2021  Vaping Use   Vaping Use: Never used  Substance and Sexual Activity   Alcohol use: Yes    Alcohol/week: 1.0 standard drink    Types: 1 Standard drinks or equivalent per week    Comment: rare   Drug use: No   Sexual activity: Not on file  Other Topics Concern   Not on file  Social History Narrative   Not on file   Social Determinants  of Health   Financial Resource Strain: Not on file  Food Insecurity: Not on file  Transportation Needs: Not on file  Physical Activity: Not on file  Stress: Not on file  Social Connections: Not on file  Intimate Partner Violence: Not on file     ROS- All systems are reviewed and negative except as per the HPI above.  Physical Exam: Vitals:   05/29/21 0924  BP: (!) 133/93  Pulse: 79  Resp: 20  Temp: 97.7 F (36.5 C)  TempSrc: Oral  SpO2: 97%  Weight: 106.6 kg  Height: 6' (1.829 m)    GEN- The patient is a well appearing male, alert and oriented x 3 today.   Head- normocephalic, atraumatic Eyes-  Sclera clear, conjunctiva pink Ears- hearing intact Oropharynx- clear Neck- supple  Lungs-  Clear to ausculation bilaterally, normal work of breathing Heart- Regular rate and rhythm (post [facility]), no murmurs, rubs or gallops  GI- soft, NT, ND, + BS Extremities- no clubbing, cyanosis, or edema MS- no significant deformity or atrophy Skin- no rash or lesion Psych- euthymic mood, full affect Neuro- strength and sensation are intact  Wt Readings from Last 3 Encounters:  05/29/21 106.6 kg  05/29/21 106.6 kg  05/16/21 106.1 kg    Echo 04/16/21 demonstrated   1. Left ventricular ejection fraction, by estimation, is 30 to 35%. The  left ventricle has moderately decreased function. The left ventricle  demonstrates global hypokinesis. There is moderate asymmetric left  ventricular hypertrophy of the posterior-lateral segment. Left ventricular diastolic parameters are indeterminate.   2. Right ventricular systolic function is moderately reduced. The right  ventricular size is normal.   3. Left atrial size was mild to moderately dilated.   4. Right atrial size was mildly dilated.   5. The mitral valve is grossly normal. Trivial mitral valve  regurgitation. No evidence of mitral stenosis.   6. The aortic valve is abnormal. There is moderate calcification of the  aortic valve. Aortic  valve regurgitation is trivial. Mild to moderate  aortic valve sclerosis/calcification is present, without any evidence of  aortic stenosis.   7. The inferior vena cava is normal in size with <50% respiratory  variability, suggesting right atrial pressure of 8 mmHg.   Epic records are reviewed at length today  CHA2DS2-VASc Score = 4  The patient's score is based upon: CHF History: 1 HTN History: 1 Diabetes History: 1 Stroke History: 0 Vascular Disease History: 1 Age Score: 0 Gender Score: 0       ASSESSMENT AND PLAN: 1. Persistent Atrial Fibrillation (ICD10:  I48.19) The patient's CHA2DS2-VASc score is 4, indicating a 4.8% annual risk of stroke.   Patient now s/p TEE guided DCCV today. ECG post shows NSR with stable QTc. Plan to admit for dofetilide.  Continue Eliquis 5 mg BID, states no missed doses since LHC. No recent benadryl use PharmD has screened medications Labs today show creatinine at 1.04, K+ 4.2 and mag 2.1, CrCl calculated at 115 mL/min  2. Secondary Hypercoagulable State (ICD10:  D68.69) The patient is at significant risk for stroke/thromboembolism based upon his CHA2DS2-VASc Score of 4.  Continue Apixaban (Eliquis).   3. Obesity Body mass index is 31.87 kg/m. Lifestyle modification was discussed at length including regular exercise and weight reduction.   4. HTN Stable, no changes today. Follow in sinus  5. CAD Recent PCI on 11/16. On Plavix No anginal symptoms.  6. Chronic systolic CHF EF A999333. Volume status stable on exam.  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  For [place] admission as planned.  Legrand Como "Jonni Sanger" [place], [name]  05/29/2021 10:54 AM    I have seen, examined the patient, and reviewed the above assessment and plan.  Changes to above are made where necessary.  On exam, RRR.  The patient presents with  symptomatic Afib.  Now in sinus s/p DCC by Dr  Debara Pickett.  We will admit for initiation of tikosyn.  Co Sign: Thompson Grayer, MD 05/29/2021 4:12 PM

## 2021-05-29 NOTE — Progress Notes (Signed)
Primary Care Physician: Gareth Morgan, MD Primary Cardiologist: Dr Royann Shivers  Primary Electrophysiologist: Dr Johney Frame Referring Physician: Dr Johney Frame   [name] is a 60 y.o. male with a history of HTN, CAD, chronic systolic CHF, DM, HLD, atrial fibrillation who presents for follow up in the [facility]. Patient is on Eliquis for a CHADS2VASC score of 4. He was seen by Dr Johney Frame 05/07/21 and had been in afib persistently for months. Echo showed new reduced EF at 30%. He also had PCI 05/16/21. Because of holding anticoagulation for LHC, ablation for afib was not pursued. Dofetilide was recommended with TEE DCCV prior.   Patient presents today for TEE/DCCV and dofetilide loading. He continues to have symptoms of fatigue and SOB despite good rate control. He denies any missed doses of anticoagulation since LHC.   Today, he denies symptoms of palpitations, chest pain, orthopnea, PND, lower extremity edema, dizziness, presyncope, syncope, snoring, daytime somnolence, bleeding, or neurologic sequela. The patient is tolerating medications without difficulties and is otherwise without complaint today.    Atrial Fibrillation Risk Factors:  he does not have symptoms or diagnosis of sleep apnea. he does not have a history of rheumatic fever. he does not have a history of alcohol use.   he has a BMI of Body mass index is 31.87 kg/m.Marland Kitchen Filed Weights   05/29/21 0835  Weight: 106.6 kg    Family History  Problem Relation Age of Onset   CAD Father    Vascular Disease Father        carotid artery disease     Atrial Fibrillation Management history:  Previous antiarrhythmic drugs: dofetilide  Previous cardioversions: 05/29/21 Previous ablations: 2019 CHADS2VASC score: 4 Anticoagulation history: Eliquis   Past Medical History:  Diagnosis Date   Abdominal distention    Anemia    Postoperative   Chronic diastolic CHF (congestive heart failure) (HCC)     Coronary artery disease    a. premature - acute MI age 25 s/p BMS to prox LAD. b. s/p DES to RCA, PLA, mLAD in 2007. c. 4V CABG in 2008 with LIMA to LAD, SVG to first diagonal, SVG to first OM, SVG to PDA   Diabetes (HCC)    Edema    H/O gastroesophageal reflux (GERD)    High cholesterol    Hypertension    Myocardial infarction (HCC)    Obesity    Persistent atrial fibrillation (HCC)    a. on Tikosyn, Xarelto.   Pleural effusion    resolved with therapy   Past Surgical History:  Procedure Laterality Date   ABLATION OF DYSRHYTHMIC FOCUS  09/23/2017   ATRIAL FIBRILLATION ABLATION N/A 09/23/2017   Procedure: ATRIAL FIBRILLATION ABLATION;  Surgeon: Hillis Range, MD;  Location: [facility] INVASIVE CV LAB;  Service: Cardiovascular;  Laterality: N/A;   CARDIAC CATHETERIZATION  2008   CORONARY ANGIOPLASTY  1996   CORONARY ARTERY BYPASS GRAFT  2008   LIMA-LAD, SVG-Dx, SVG-OM1, SVG-PDA   CORONARY ATHERECTOMY N/A 05/16/2021   Procedure: CORONARY ATHERECTOMY;  Surgeon: Corky Crafts, MD;  Location: [facility] INVASIVE CV LAB;  Service: Cardiovascular;  Laterality: N/A;   CORONARY STENT INTERVENTION N/A 05/01/2021   Procedure: CORONARY STENT INTERVENTION;  Surgeon: Corky Crafts, MD;  Location: [facility] INVASIVE CV LAB;  Service: Cardiovascular;  Laterality: N/A;   CORONARY STENT INTERVENTION N/A 05/16/2021   Procedure: CORONARY STENT INTERVENTION;  Surgeon: Corky Crafts, MD;  Location: [facility] INVASIVE CV LAB;  Service: Cardiovascular;  Laterality: N/A;  INTRAVASCULAR ULTRASOUND/IVUS N/A 05/16/2021   Procedure: Intravascular Ultrasound/IVUS;  Surgeon: Jettie Booze, MD;  Location: [place] CV LAB;  Service: Cardiovascular;  Laterality: N/A;   LEFT HEART CATH N/A 05/16/2021   Procedure: Left Heart Cath;  Surgeon: Jettie Booze, MD;  Location: [place] CV LAB;  Service: Cardiovascular;  Laterality: N/A;   LEFT HEART CATH AND CORS/GRAFTS ANGIOGRAPHY N/A 09/25/2016   Procedure: Left  Heart Cath and Cors/Grafts Angiography;  Surgeon: Jettie Booze, MD;  Location: [place] CV LAB;  Service: Cardiovascular;  Laterality: N/A;   LEFT HEART CATH AND CORS/GRAFTS ANGIOGRAPHY N/A 05/01/2021   Procedure: LEFT HEART CATH AND CORS/GRAFTS ANGIOGRAPHY;  Surgeon: Jettie Booze, MD;  Location: [place] CV LAB;  Service: Cardiovascular;  Laterality: N/A;    Current Outpatient Medications  Medication Sig Dispense Refill   apixaban (ELIQUIS) 5 MG TABS tablet Take 1 tablet (5 mg total) by mouth 2 (two) times daily. Resume 05/17/21 in the morning. 60 tablet 5   atorvastatin (LIPITOR) 80 MG tablet Take 1 tablet (80 mg total) by mouth at bedtime. 90 tablet 3   Cholecalciferol (VITAMIN D-3 PO) Take 1 tablet by mouth in the morning.     clopidogrel (PLAVIX) 75 MG tablet Take 1 tablet (75 mg total) by mouth daily with breakfast. 30 tablet 5   dapagliflozin propanediol (FARXIGA) 10 MG TABS tablet Take 10 mg by mouth in the morning.     ferrous sulfate 325 (65 FE) MG tablet Take 325 mg by mouth daily.     furosemide (LASIX) 40 MG tablet Take 40 mg by mouth daily.     glimepiride (AMARYL) 4 MG tablet Take 4 mg by mouth daily with breakfast.     isosorbide mononitrate (IMDUR) 30 MG 24 hr tablet Take 30 mg by mouth in the morning.     JANUVIA 100 MG tablet Take 100 mg by mouth in the morning.     losartan (COZAAR) 50 MG tablet Take 50 mg by mouth every evening.     metFORMIN (GLUCOPHAGE) 1000 MG tablet Take 1 tablet (1,000 mg total) by mouth 2 (two) times daily with a meal. Resume on 05/19/21     Multiple Vitamins-Minerals (MULTIVITAMIN WITH MINERALS) tablet Take 1 tablet by mouth 2 (two) times daily.      Nebivolol HCl (BYSTOLIC) 20 MG TABS Take 1 tablet (20 mg total) by mouth every morning. 90 tablet 3   nitroGLYCERIN (NITROSTAT) 0.4 MG SL tablet Place 1 tablet (0.4 mg total) under the tongue every 5 (five) minutes as needed for chest pain. 25 tablet 4   potassium chloride SA  (K-DUR,KLOR-CON) 20 MEQ tablet Take 1 tablet (20 mEq total) by mouth daily. 90 tablet 3   No current facility-administered medications for this encounter.    No Known Allergies  Social History   Socioeconomic History   Marital status: Married    Spouse name: Not on file   Number of children: Not on file   Years of education: Not on file   Highest education level: Not on file  Occupational History   Occupation: Regulatory affairs officer    Employer: [employer]   Occupation: Has cattle and Sales promotion account executive.  Tobacco Use   Smoking status: Former    Packs/day: 2.00    Years: 30.00    Pack years: 60.00    Types: Cigarettes   Smokeless tobacco: Never   Tobacco comments:    Former smoker 05/29/2021  Vaping Use   Vaping Use: Never  used  Substance and Sexual Activity   Alcohol use: Yes    Alcohol/week: 1.0 standard drink    Types: 1 Standard drinks or equivalent per week    Comment: rare   Drug use: No   Sexual activity: Not on file  Other Topics Concern   Not on file  Social History Narrative   Not on file   Social Determinants of Health   Financial Resource Strain: Not on file  Food Insecurity: Not on file  Transportation Needs: Not on file  Physical Activity: Not on file  Stress: Not on file  Social Connections: Not on file  Intimate Partner Violence: Not on file     ROS- All systems are reviewed and negative except as per the HPI above.  Physical Exam: Vitals:   05/29/21 0835  BP: 110/70  Pulse: 87  Weight: 106.6 kg  Height: 6' (1.829 m)    GEN- The patient is a well appearing obese male, alert and oriented x 3 today.   Head- normocephalic, atraumatic Eyes-  Sclera clear, conjunctiva pink Ears- hearing intact Oropharynx- clear Neck- supple  Lungs- Clear to ausculation bilaterally, normal work of breathing Heart- irregular rate and rhythm, no murmurs, rubs or gallops  GI- soft, NT, ND, + BS Extremities- no clubbing, cyanosis, or edema MS- no significant  deformity or atrophy Skin- no rash or lesion Psych- euthymic mood, full affect Neuro- strength and sensation are intact  Wt Readings from Last 3 Encounters:  05/29/21 106.6 kg  05/16/21 106.1 kg  05/07/21 105.7 kg    EKG today demonstrates  Afib Vent. rate 87 BPM PR interval * ms QRS duration 88 ms QT/QTcB 360/433 ms  Echo 04/16/21 demonstrated   1. Left ventricular ejection fraction, by estimation, is 30 to 35%. The  left ventricle has moderately decreased function. The left ventricle  demonstrates global hypokinesis. There is moderate asymmetric left  ventricular hypertrophy of the posterior-lateral segment. Left ventricular diastolic parameters are indeterminate.   2. Right ventricular systolic function is moderately reduced. The right  ventricular size is normal.   3. Left atrial size was mild to moderately dilated.   4. Right atrial size was mildly dilated.   5. The mitral valve is grossly normal. Trivial mitral valve  regurgitation. No evidence of mitral stenosis.   6. The aortic valve is abnormal. There is moderate calcification of the  aortic valve. Aortic valve regurgitation is trivial. Mild to moderate  aortic valve sclerosis/calcification is present, without any evidence of  aortic stenosis.   7. The inferior vena cava is normal in size with <50% respiratory  variability, suggesting right atrial pressure of 8 mmHg.   Epic records are reviewed at length today  CHA2DS2-VASc Score = 4  The patient's score is based upon: CHF History: 1 HTN History: 1 Diabetes History: 1 Stroke History: 0 Vascular Disease History: 1 Age Score: 0 Gender Score: 0      ASSESSMENT AND PLAN: 1. Persistent Atrial Fibrillation (ICD10:  I48.19) The patient's CHA2DS2-VASc score is 4, indicating a 4.8% annual risk of stroke.   Patient scheduled for TEE guided DCCV today.  Also plan to admit for dofetilide.  Continue Eliquis 5 mg BID, states no missed doses since LHC. No recent  benadryl use PharmD has screened medications Labs today show creatinine at 1.04, K+ 4.2 and mag 2.1, CrCl calculated at 115 mL/min  2. Secondary Hypercoagulable State (ICD10:  D68.69) The patient is at significant risk for stroke/thromboembolism based upon his CHA2DS2-VASc Score  of 4.  Continue Apixaban (Eliquis).   3. Obesity Body mass index is 31.87 kg/m. Lifestyle modification was discussed at length including regular exercise and weight reduction.  4. HTN Stable, no changes today.  5. CAD Recent PCI on 11/16. On Plavix No anginal symptoms.  6. Chronic systolic CHF EF A999333. No symptoms of fluid overload today.   To be admitted for dofetilide loading after TEE.    [facility] [address] [place], [place] [address] [phone] 05/29/2021 8:45 AM

## 2021-05-29 NOTE — Anesthesia Postprocedure Evaluation (Signed)
Anesthesia Post Note  Patient: [name]  Procedure(s) Performed: TRANSESOPHAGEAL ECHOCARDIOGRAM (TEE) CARDIOVERSION BUBBLE STUDY     Patient location during evaluation: Endoscopy Anesthesia Type: General Level of consciousness: awake Pain management: pain level controlled Vital Signs Assessment: post-procedure vital signs reviewed and stable Respiratory status: spontaneous breathing Cardiovascular status: stable Postop Assessment: no apparent nausea or vomiting Anesthetic complications: no   No notable events documented.  Last Vitals:  Vitals:   05/29/21 1111 05/29/21 1115  BP: 94/65 96/60  Pulse: 68 68  Resp: 17 18  Temp:    SpO2: 96% 95%    Last Pain:  Vitals:   05/29/21 1115  TempSrc:   PainSc: 0-No pain                 Caren Macadam

## 2021-05-30 ENCOUNTER — Encounter (HOSPITAL_COMMUNITY): Payer: Self-pay | Admitting: Internal Medicine

## 2021-05-30 ENCOUNTER — Other Ambulatory Visit (HOSPITAL_COMMUNITY): Payer: Self-pay

## 2021-05-30 DIAGNOSIS — I4819 Other persistent atrial fibrillation: Secondary | ICD-10-CM | POA: Diagnosis not present

## 2021-05-30 LAB — BASIC METABOLIC PANEL
Anion gap: 8 (ref 5–15)
BUN: 20 mg/dL (ref 6–20)
CO2: 25 mmol/L (ref 22–32)
Calcium: 8.8 mg/dL — ABNORMAL LOW (ref 8.9–10.3)
Chloride: 101 mmol/L (ref 98–111)
Creatinine, Ser: 1.07 mg/dL (ref 0.61–1.24)
GFR, Estimated: >60 mL/min (ref >60)
Glucose, Bld: 173 mg/dL — ABNORMAL HIGH (ref 70–99)
Potassium: 4 mmol/L (ref 3.5–5.1)
Sodium: 134 mmol/L — ABNORMAL LOW (ref 135–145)

## 2021-05-30 LAB — MAGNESIUM: Magnesium: 2.1 mg/dL (ref 1.7–2.4)

## 2021-05-30 NOTE — Plan of Care (Signed)
  Problem: Activity: Goal: Ability to tolerate increased activity will improve Outcome: Progressing   Problem: Cardiac: Goal: Ability to achieve and maintain adequate cardiopulmonary perfusion will improve Outcome: Progressing   Problem: Health Behavior/Discharge Planning: Goal: Ability to safely manage health-related needs after discharge will improve Outcome: Progressing   

## 2021-05-30 NOTE — Progress Notes (Signed)
Mobility Specialist Progress Note    05/30/21 1633  Mobility  Activity Ambulated in hall  Level of Assistance Independent  Distance Ambulated (ft) 480 ft  Mobility Ambulated independently in hallway  Mobility Response Tolerated well  Mobility performed by Mobility specialist  Bed Position Chair  $Mobility charge 1 Mobility   Pt received in chair and agreeable. No complaints on walk. Returned to chair with call bell in reach.   [facility] Mobility Specialist  M.S. Primary Phone: [phone] M.S. Secondary Phone: [phone]

## 2021-05-30 NOTE — Progress Notes (Signed)
Pharmacy: Dofetilide (Tikosyn) - Follow Up Assessment and Electrolyte Replacement  Pharmacy consulted to assist in monitoring and replacing electrolytes in this 60 y.o. male admitted on 05/29/2021 undergoing dofetilide initiation. First dofetilide dose: 05/29/21  Labs:    Component Value Date/Time   K 4.0 05/30/2021 0206   MG 2.1 05/30/2021 0206     Plan: Potassium: K >/= 4: No additional supplementation needed  Magnesium: Mg > 2: No additional supplementation needed    Thank you for allowing pharmacy to participate in this patient's care   Fredonia Highland, PharmD, BCPS, [facility] Clinical Pharmacist [phone] Please check AMION for all [facility] Pharmacy numbers 05/30/2021

## 2021-05-30 NOTE — Care Management (Signed)
05/30/21 1252 Patient presented for Tikosyn Load. Case Manager spoke with patient regarding co pay cost. Patient would like to have the initial Rx sent to [facility] and the refills sent to [facility]. No further needs identified at this time.

## 2021-05-30 NOTE — Progress Notes (Signed)
Morning EKG reviewed  (In paper chart but not yet scanned in)  Shows remains in NSR  with stable QTc at 480 ms.  Continue  Tikosyn 500 mcg BID.   Pt will not require DCCV. Likely home Friday if QTc remains stable.   Graciella Freer, [name]  Pager: [phone]  05/30/2021 4:20 PM

## 2021-05-30 NOTE — TOC Benefit Eligibility Note (Signed)
Patient Product/process development scientist completed.    The patient is currently admitted and upon discharge could be taking dofetilide (Tikosyn) 500 mcg capsule.  The current 30 day co-pay is, $82.22.   The patient is insured through [employer]     Roland Earl, CPhT Pharmacy Patient Advocate Specialist [facility] Patient Advocate Team Direct Number: [phone]  Fax: [phone]

## 2021-05-30 NOTE — Progress Notes (Addendum)
Electrophysiology Rounding Note  Patient Name: [name] Date of Encounter: 05/30/2021  Primary Cardiologist: Sanda Klein, MD  Electrophysiologist: Thompson Grayer, MD    Subjective   Pt  remains in NSR  on Tikosyn 500 mcg BID   QTc from EKG last pm shows stable QTc at ~460  The patient is doing well today.  At this time, the patient denies chest pain, shortness of breath, or any new concerns.  Inpatient Medications    Scheduled Meds:  apixaban  5 mg Oral BID   atorvastatin  80 mg Oral QHS   clopidogrel  75 mg Oral Q breakfast   dapagliflozin propanediol  10 mg Oral q AM   dofetilide  500 mcg Oral BID   ferrous sulfate  325 mg Oral Daily   furosemide  40 mg Oral Daily   glimepiride  4 mg Oral Q breakfast   isosorbide mononitrate  30 mg Oral q AM   linagliptin  5 mg Oral Daily   losartan  50 mg Oral QPM   metFORMIN  1,000 mg Oral BID WC   multivitamin with minerals  1 tablet Oral BID   nebivolol  20 mg Oral BH-q7a   potassium chloride SA  20 mEq Oral Daily   sodium chloride flush  3 mL Intravenous Q12H   Continuous Infusions:  sodium chloride     sodium chloride 20 mL/hr at 05/29/21 0941   sodium chloride     PRN Meds: sodium chloride, nitroGLYCERIN, sodium chloride flush   Vital Signs    Vitals:   05/29/21 1541 05/29/21 2016 05/29/21 2336 05/30/21 0437  BP: 128/83 119/73 131/77 106/68  Pulse: 65 77 71 68  Resp: 15 18 19 20   Temp:  98.2 F (36.8 C)  97.9 F (36.6 C)  TempSrc:  Oral  Oral  SpO2: 99% 100% 100% 99%  Weight:      Height:        Intake/Output Summary (Last 24 hours) at 05/30/2021 0840 Last data filed at 05/29/2021 1057 Gross per 24 hour  Intake 325 ml  Output --  Net 325 ml   Filed Weights   05/29/21 0924 05/29/21 1535  Weight: 106.6 kg 106.1 kg    Physical Exam    GEN- The patient is well appearing, alert and oriented x 3 today.   Head- normocephalic, atraumatic Eyes-  Sclera clear, conjunctiva pink Ears- hearing  intact Oropharynx- clear Neck- supple Lungs- Clear to ausculation bilaterally, normal work of breathing Heart- Regular rate and rhythm, no murmurs, rubs or gallops GI- soft, NT, ND, + BS Extremities- no clubbing, cyanosis, or edema Skin- no rash or lesion Psych- euthymic mood, full affect Neuro- strength and sensation are intact  Labs    CBC Recent Labs    05/29/21 0842  WBC 9.9  HGB 14.6  HCT 45.1  MCV 90.7  PLT [id number]   Basic Metabolic Panel Recent Labs    05/29/21 1707 05/30/21 0206  NA 137 134*  K 4.0 4.0  CL 103 101  CO2 27 25  GLUCOSE 203* 173*  BUN 19 20  CREATININE 1.05 1.07  CALCIUM 9.5 8.8*  MG 1.9 2.1    Potassium  Date/Time Value Ref Range Status  05/30/2021 02:06 AM 4.0 3.5 - 5.1 mmol/L Final   Magnesium  Date/Time Value Ref Range Status  05/30/2021 02:06 AM 2.1 1.7 - 2.4 mg/dL Final    Comment:    Performed at [facility], [place] [address]  [place], [place] [address]    Telemetry    NSR 60-80s (personally reviewed)  Radiology    ECHO TEE  Result Date: 05/29/2021    TRANSESOPHOGEAL ECHO REPORT   Patient Name:   [name] Date of Exam: 05/29/2021 Medical Rec #:  [id number]     Height:       72.0 in Accession #:    [id number]    Weight:       235.0 lb Date of Birth:  1960-12-25     BSA:          2.282 m Patient Age:    89 years      BP:           111/87 mmHg Patient Gender: M             HR:           83 bpm. Exam Location:  Inpatient Procedure: Transesophageal Echo, Color Doppler and Cardiac Doppler Indications:     Atrial fibrillation  History:         Patient has prior history of Echocardiogram examinations, most                  recent 04/16/2021. CAD, Prior CABG; Risk Factors:Hypertension                  and Dyslipidemia.  Sonographer:     Johny Chess RDCS Referring Phys:  [place] Diagnosing Phys: Lyman Bishop MD PROCEDURE: The transesophogeal probe was passed without difficulty through the esophogus of the patient.  Imaged were obtained with the patient in a supine position. Sedation performed by different physician. The patient was monitored while under deep sedation. Anesthestetic sedation was provided intravenously by Anesthesiology: 250mg  of Propofol. The patient developed no complications during the procedure. A successful direct current cardioversion was performed at 150 joules with 1 attempt. IMPRESSIONS  1. Left ventricular ejection fraction, by estimation, is 55 to 60%. The left ventricle has normal function. There is mild left ventricular hypertrophy.  2. Right ventricular systolic function is mildly reduced. The right ventricular size is normal.  3. Left atrial size was mildly dilated. No left atrial/left atrial appendage thrombus was detected.  4. Right atrial size was mildly dilated.  5. The mitral valve is grossly normal. No evidence of mitral valve regurgitation.  6. The aortic valve is tricuspid. Aortic valve regurgitation is not visualized.  7. Agitated saline contrast bubble study was negative, with no evidence of any interatrial shunt. Conclusion(s)/Recommendation(s): No LA/LAA thrombus identified. Successful cardioversion performed with restoration of normal sinus rhythm. FINDINGS  Left Ventricle: Left ventricular ejection fraction, by estimation, is 55 to 60%. The left ventricle has normal function. The left ventricular internal cavity size was normal in size. There is mild left ventricular hypertrophy. Right Ventricle: The right ventricular size is normal. No increase in right ventricular wall thickness. Right ventricular systolic function is mildly reduced. Left Atrium: Left atrial size was mildly dilated. No left atrial/left atrial appendage thrombus was detected. Right Atrium: Right atrial size was mildly dilated. Pericardium: There is no evidence of pericardial effusion. Mitral Valve: The mitral valve is grossly normal. No evidence of mitral valve regurgitation. Tricuspid Valve: The tricuspid valve is  normal in structure. Tricuspid valve regurgitation is not demonstrated. Aortic Valve: The aortic valve is tricuspid. Aortic valve regurgitation is not visualized. Pulmonic Valve: The pulmonic valve was grossly normal. Pulmonic valve regurgitation is trivial. Aorta: The aortic root and ascending aorta are structurally normal,  with no evidence of dilitation. Venous: The left upper pulmonary vein is normal. IAS/Shunts: The interatrial septum is aneurysmal. No atrial level shunt detected by color flow Doppler. Agitated saline contrast was given intravenously to evaluate for intracardiac shunting. Agitated saline contrast bubble study was negative, with no evidence of any interatrial shunt. Zoila Shutter MD Electronically signed by Zoila Shutter MD Signature Date/Time: 05/29/2021/12:21:03 PM    Final      Patient Profile     VADIM CENTOLA is a 60 y.o. male with a past medical history significant for persistent atrial fibrillation.  They were admitted for tikosyn load.   Assessment & Plan    Persistent atrial fibrillation Pt  remains in NSR s/p DCC  on Tikosyn 500 mcg BID  Continue Eliquis Electrolytes stable.  CHA2DS2VASC is at least 4.  2. Obesity Body mass index is 31.87 kg/m. Lifestyle modification has been encouraged    3. HTN Stable on current regimen    4. CAD Recent PCI on 11/16. On Plavix No anginal symptoms.   5. Chronic systolic CHF EF 30%-35% 03/2021 -> TEE yesterday shows potential improvement already s/p PCI 11/16   Continue tikosyn load. Earliest d/c is Friday if QT remains stable  For questions or updates, please contact [facility] Please consult [email or web address] for contact info under Cardiology/STEMI.  Signed, Graciella Freer, [name]  05/30/2021, 8:40 AM    I have seen, examined the patient, and reviewed the above assessment and plan.  Changes to above are made where necessary.  On exam, RRR.   Qt is stable.  Continue tikosyn load.  Co Sign: Hillis Range,  MD

## 2021-05-31 ENCOUNTER — Encounter (HOSPITAL_COMMUNITY): Payer: Self-pay | Admitting: Internal Medicine

## 2021-05-31 DIAGNOSIS — I4819 Other persistent atrial fibrillation: Secondary | ICD-10-CM | POA: Diagnosis not present

## 2021-05-31 LAB — BASIC METABOLIC PANEL
Anion gap: 10 (ref 5–15)
BUN: 20 mg/dL (ref 6–20)
CO2: 25 mmol/L (ref 22–32)
Calcium: 9.3 mg/dL (ref 8.9–10.3)
Chloride: 102 mmol/L (ref 98–111)
Creatinine, Ser: 1.12 mg/dL (ref 0.61–1.24)
GFR, Estimated: >60 mL/min (ref >60)
Glucose, Bld: 146 mg/dL — ABNORMAL HIGH (ref 70–99)
Potassium: 3.9 mmol/L (ref 3.5–5.1)
Sodium: 137 mmol/L (ref 135–145)

## 2021-05-31 LAB — MAGNESIUM: Magnesium: 2.1 mg/dL (ref 1.7–2.4)

## 2021-05-31 MED ORDER — POTASSIUM CHLORIDE CRYS ER 20 MEQ PO TBCR
40.0000 meq | EXTENDED_RELEASE_TABLET | Freq: Once | ORAL | Status: AC
  Administered 2021-05-31: 40 meq via ORAL
  Filled 2021-05-31: qty 2

## 2021-05-31 NOTE — Progress Notes (Addendum)
Electrophysiology Rounding Note  Patient Name: [name] Date of Encounter: 05/31/2021  Primary Cardiologist: Thurmon Fair, MD  Electrophysiologist: Hillis Range, MD    Subjective   Pt  remains in NSR  on Tikosyn 500 mcg BID   QTc from EKG last pm shows stable QTc at ~450-460 ms  The patient is doing well today.  At this time, the patient denies chest pain, shortness of breath, or any new concerns.  Inpatient Medications    Scheduled Meds:  apixaban  5 mg Oral BID   atorvastatin  80 mg Oral QHS   clopidogrel  75 mg Oral Q breakfast   dapagliflozin propanediol  10 mg Oral q AM   dofetilide  500 mcg Oral BID   ferrous sulfate  325 mg Oral Daily   furosemide  40 mg Oral Daily   glimepiride  4 mg Oral Q breakfast   isosorbide mononitrate  30 mg Oral q AM   linagliptin  5 mg Oral Daily   losartan  50 mg Oral QPM   metFORMIN  1,000 mg Oral BID WC   multivitamin with minerals  1 tablet Oral BID   nebivolol  20 mg Oral BH-q7a   potassium chloride SA  20 mEq Oral Daily   potassium chloride  40 mEq Oral Once   sodium chloride flush  3 mL Intravenous Q12H   Continuous Infusions:  sodium chloride     sodium chloride 20 mL/hr at 05/29/21 0941   sodium chloride     PRN Meds: sodium chloride, nitroGLYCERIN, sodium chloride flush   Vital Signs    Vitals:   05/30/21 0437 05/30/21 1441 05/30/21 1955 05/31/21 0540  BP: 106/68 134/81 129/81 116/80  Pulse: 68 72 73 71  Resp: 20 20 17 15   Temp: 97.9 F (36.6 C) 98 F (36.7 C) 98.1 F (36.7 C) 98.1 F (36.7 C)  TempSrc: Oral Oral Oral Oral  SpO2: 99% 98% 98% 98%  Weight:      Height:        Intake/Output Summary (Last 24 hours) at 05/31/2021 0708 Last data filed at 05/31/2021 0630 Gross per 24 hour  Intake 480 ml  Output --  Net 480 ml   Filed Weights   05/29/21 0924 05/29/21 1535  Weight: 106.6 kg 106.1 kg    Physical Exam    GEN- The patient is well appearing, alert and oriented x 3 today.   Head-  normocephalic, atraumatic Eyes-  Sclera clear, conjunctiva pink Ears- hearing intact Oropharynx- clear Neck- supple Lungs- Clear to ausculation bilaterally, normal work of breathing Heart- Regular rate and rhythm, no murmurs, rubs or gallops GI- soft, NT, ND, + BS Extremities- no clubbing, cyanosis, or edema Skin- no rash or lesion Psych- euthymic mood, full affect Neuro- strength and sensation are intact  Labs    CBC Recent Labs    05/29/21 0842  WBC 9.9  HGB 14.6  HCT 45.1  MCV 90.7  PLT 210   Basic Metabolic Panel Recent Labs    05/31/21 0206 05/31/21 0133  NA 134* 137  K 4.0 3.9  CL 101 102  CO2 25 25  GLUCOSE 173* 146*  BUN 20 20  CREATININE 1.07 1.12  CALCIUM 8.8* 9.3  MG 2.1 2.1    Potassium  Date/Time Value Ref Range Status  05/31/2021 01:33 AM 3.9 3.5 - 5.1 mmol/L Final   Magnesium  Date/Time Value Ref Range Status  05/31/2021 01:33 AM 2.1 1.7 - 2.4 mg/dL Final  Comment:    Performed at [facility], [place] [address], [place], [place] [address]    Telemetry    NSR [phone] with rare PVCs that preceded tikosyn (personally reviewed)  Radiology    ECHO TEE  Result Date: 05/29/2021    TRANSESOPHOGEAL ECHO REPORT   Patient Name:   [name] Date of Exam: 05/29/2021 Medical Rec #:  [id number]     Height:       72.0 in Accession #:    [id number]    Weight:       235.0 lb Date of Birth:  1961-04-13     BSA:          2.282 m Patient Age:    60 years      BP:           111/87 mmHg Patient Gender: M             HR:           83 bpm. Exam Location:  Inpatient Procedure: Transesophageal Echo, Color Doppler and Cardiac Doppler Indications:     Atrial fibrillation  History:         Patient has prior history of Echocardiogram examinations, most                  recent 04/16/2021. CAD, Prior CABG; Risk Factors:Hypertension                  and Dyslipidemia.  Sonographer:     Johny Chess RDCS Referring Phys:  [place] Diagnosing Phys: Lyman Bishop MD PROCEDURE: The transesophogeal probe was passed without difficulty through the esophogus of the patient. Imaged were obtained with the patient in a supine position. Sedation performed by different physician. The patient was monitored while under deep sedation. Anesthestetic sedation was provided intravenously by Anesthesiology: 250mg  of Propofol. The patient developed no complications during the procedure. A successful direct current cardioversion was performed at 150 joules with 1 attempt. IMPRESSIONS  1. Left ventricular ejection fraction, by estimation, is 55 to 60%. The left ventricle has normal function. There is mild left ventricular hypertrophy.  2. Right ventricular systolic function is mildly reduced. The right ventricular size is normal.  3. Left atrial size was mildly dilated. No left atrial/left atrial appendage thrombus was detected.  4. Right atrial size was mildly dilated.  5. The mitral valve is grossly normal. No evidence of mitral valve regurgitation.  6. The aortic valve is tricuspid. Aortic valve regurgitation is not visualized.  7. Agitated saline contrast bubble study was negative, with no evidence of any interatrial shunt. Conclusion(s)/Recommendation(s): No LA/LAA thrombus identified. Successful cardioversion performed with restoration of normal sinus rhythm. FINDINGS  Left Ventricle: Left ventricular ejection fraction, by estimation, is 55 to 60%. The left ventricle has normal function. The left ventricular internal cavity size was normal in size. There is mild left ventricular hypertrophy. Right Ventricle: The right ventricular size is normal. No increase in right ventricular wall thickness. Right ventricular systolic function is mildly reduced. Left Atrium: Left atrial size was mildly dilated. No left atrial/left atrial appendage thrombus was detected. Right Atrium: Right atrial size was mildly dilated. Pericardium: There is no evidence of pericardial effusion. Mitral Valve: The  mitral valve is grossly normal. No evidence of mitral valve regurgitation. Tricuspid Valve: The tricuspid valve is normal in structure. Tricuspid valve regurgitation is not demonstrated. Aortic Valve: The aortic valve is tricuspid. Aortic valve regurgitation is not visualized. Pulmonic Valve: The  pulmonic valve was grossly normal. Pulmonic valve regurgitation is trivial. Aorta: The aortic root and ascending aorta are structurally normal, with no evidence of dilitation. Venous: The left upper pulmonary vein is normal. IAS/Shunts: The interatrial septum is aneurysmal. No atrial level shunt detected by color flow Doppler. Agitated saline contrast was given intravenously to evaluate for intracardiac shunting. Agitated saline contrast bubble study was negative, with no evidence of any interatrial shunt. Lyman Bishop MD Electronically signed by Lyman Bishop MD Signature Date/Time: 05/29/2021/12:21:03 PM    Final      Patient Profile     JAYKWON BORDNER is a 60 y.o. male with a past medical history significant for persistent atrial fibrillation.  They were admitted for tikosyn load.   Assessment & Plan    Persistent atrial fibrillation Pt  remains in NSR  on Tikosyn 500 mcg BID  Continue Eliquis K 3.9, Mg 2.1. K supped CHA2DS2VASC is at least 4  2. Obesity Body mass index is 31.71 kg/m.  Lifestyle modification has been encouraged  3. HTN Stable on current regimen    4. CAD Recent PCI on 11/16. On Plavix No s/s ischemia    5. Chronic systolic CHF EF [id number] -> TEE 11/29 shows potential improvement already s/p PCI 11/16   Plan for home tomorrow if QTc remains stable.  For questions or updates, please contact [place] Please consult [email or web address] for contact info under Cardiology/STEMI.  Signed, Shirley Friar, [name]  05/31/2021, 7:08 AM    I have seen, examined the patient, and reviewed the above assessment and plan.  Changes to above are made where necessary.  On  exam, RRR.  Qt is stable.  Hopefully to discharge to home tomorrow.  Co Sign: Thompson Grayer, MD 05/31/2021 7:20 PM

## 2021-05-31 NOTE — Progress Notes (Signed)
Morning EKG reviewed    Shows remains in NSR at 69 bpm with stable QTc at ~440-450 ms.  Continue  Tikosyn 500 mcg BID.   Plan for home tomorrow if QTc remains stable.   Graciella Freer, [name]  Pager: [phone]  05/31/2021 1:55 PM

## 2021-05-31 NOTE — Progress Notes (Signed)
Pharmacy: Dofetilide (Tikosyn) - Follow Up Assessment and Electrolyte Replacement  Pharmacy consulted to assist in monitoring and replacing electrolytes in this 60 y.o. male admitted on 05/29/2021 undergoing dofetilide initiation. First dofetilide dose: 05/29/21  Labs:    Component Value Date/Time   K 3.9 05/31/2021 0133   MG 2.1 05/31/2021 0133     Plan: Potassium: K 3.8-3.9:  Give KCl 40 mEq po x1   Magnesium: Mg > 2: No additional supplementation needed    Thank you for allowing pharmacy to participate in this patient's care   Mosetta Anis 05/31/2021  7:02 AM

## 2021-06-01 ENCOUNTER — Ambulatory Visit: Payer: Federal, State, Local not specified - PPO | Admitting: Cardiovascular Disease

## 2021-06-01 ENCOUNTER — Other Ambulatory Visit (HOSPITAL_COMMUNITY): Payer: Self-pay

## 2021-06-01 LAB — BASIC METABOLIC PANEL
Anion gap: 8 (ref 5–15)
BUN: 21 mg/dL — ABNORMAL HIGH (ref 6–20)
CO2: 22 mmol/L (ref 22–32)
Calcium: 9.2 mg/dL (ref 8.9–10.3)
Chloride: 105 mmol/L (ref 98–111)
Creatinine, Ser: 1.13 mg/dL (ref 0.61–1.24)
GFR, Estimated: >60 mL/min (ref >60)
Glucose, Bld: 145 mg/dL — ABNORMAL HIGH (ref 70–99)
Potassium: 3.7 mmol/L (ref 3.5–5.1)
Sodium: 135 mmol/L (ref 135–145)

## 2021-06-01 LAB — MAGNESIUM: Magnesium: 1.9 mg/dL (ref 1.7–2.4)

## 2021-06-01 MED ORDER — POTASSIUM CHLORIDE CRYS ER 20 MEQ PO TBCR
40.0000 meq | EXTENDED_RELEASE_TABLET | Freq: Every day | ORAL | 6 refills | Status: DC
  Filled 2021-06-01: qty 60, 30d supply, fill #0

## 2021-06-01 MED ORDER — POTASSIUM CHLORIDE CRYS ER 20 MEQ PO TBCR
60.0000 meq | EXTENDED_RELEASE_TABLET | Freq: Once | ORAL | Status: AC
  Administered 2021-06-01: 60 meq via ORAL
  Filled 2021-06-01: qty 3

## 2021-06-01 MED ORDER — MAGNESIUM SULFATE 2 GM/50ML IV SOLN
2.0000 g | Freq: Once | INTRAVENOUS | Status: AC
  Administered 2021-06-01: 2 g via INTRAVENOUS
  Filled 2021-06-01: qty 50

## 2021-06-01 MED ORDER — DOFETILIDE 500 MCG PO CAPS
500.0000 ug | ORAL_CAPSULE | Freq: Two times a day (BID) | ORAL | 6 refills | Status: DC
  Filled 2021-06-01: qty 60, 30d supply, fill #0

## 2021-06-01 NOTE — Progress Notes (Signed)
EKG from yesterday evening 12/1 reviewed    Shows remains in NSR at 73 bpm with stable QTc at 450 ms.  Continue  Tikosyn 500 mcg BID.   Plan for home this afternoon if QTc remains stable.     Graciella Freer, [name]  Pager: [phone]  06/01/2021 7:18 AM

## 2021-06-01 NOTE — Progress Notes (Addendum)
Pharmacy: Dofetilide (Tikosyn) - Follow Up Assessment and Electrolyte Replacement  Pharmacy consulted to assist in monitoring and replacing electrolytes in this 60 y.o. male admitted on 05/29/2021 undergoing dofetilide initiation. First dofetilide dose: 05/29/21  Labs:    Component Value Date/Time   K 3.7 06/01/2021 0125   MG 1.9 06/01/2021 0125     Plan: Potassium: K 3.5-3.7:  Give KCl 60 mEq po x1 - on top of his scheduled 20 mEq qd   Magnesium: Mg 1.8-2: Give Mg 2 gm IV x1   As patient has required on average 45 mEq of potassium replacement every day, recommend discharging patient with prescription for:  Potassium chloride 40 mEq  daily  Thank you for allowing pharmacy to participate in this patient's care   Sherron Monday, PharmD, BCCCP Clinical Pharmacist  Phone: [phone] 06/01/2021 7:16 AM  Please check AMION for all [facility] Pharmacy phone numbers After 10:00 PM, call Main Pharmacy [phone]

## 2021-06-01 NOTE — Discharge Summary (Signed)
ELECTROPHYSIOLOGY PROCEDURE DISCHARGE SUMMARY    Patient ID: [name],  MRN: [id number], DOB/AGE: 1960/08/26 60 y.o.  Admit date: 05/29/2021 Discharge date: 06/01/2021  Primary Care Physician: Lemmie Evens, MD  Primary Cardiologist: Sanda Klein, MD  Electrophysiologist: Thompson Grayer, MD   Primary Discharge Diagnosis:  1.  Persistent atrial fibrillation status post Tikosyn loading this admission  Secondary Discharge Diagnosis:  2. Obesity 3. HTN 4. CAD 4. Chronic systolic CHF  No Known Allergies   Procedures This Admission:  1.  Tikosyn loading 2.  TEE and Direct current cardioversion on Thursday May 31, 2021  by Dr Debara Pickett which successfully restored SR.  There were no early apparent complications.   Brief HPI: [name] is a 60 y.o. male with a past medical history as noted above.  They were referred to EP in the outpatient setting for treatment options of atrial fibrillation.  Risks, benefits, and alternatives to Tikosyn were reviewed with the patient who wished to proceed.    Hospital Course:  The patient was admitted and Tikosyn was initiated. Due to recent PCI he underwent TEE and [facility] 05/29/21 to NSR. Renal function and electrolytes were followed during the hospitalization.  Their QTc remained stable. They were monitored until discharge on telemetry which demonstrated stable NSR.  On the day of discharge, they were examined by Dr. Rayann Heman  who considered them stable for discharge to home.  Follow-up has been arranged with the Atrial Fibrillation clinic in approximately 1 week and with  EP APP   in 4 weeks.   F/u with Dr. Sallyanne Kuster to follow that.  Physical Exam: Vitals:   05/31/21 2242 06/01/21 0505 06/01/21 0745 06/01/21 0800  BP:  124/75 114/77 130/79  Pulse:  69    Resp: 20 19 18 16   Temp:  97.9 F (36.6 C)    TempSrc:  Oral    SpO2:  96%    Weight:      Height:        GEN- The patient is well appearing, alert and oriented x 3 today.    HEENT: normocephalic, atraumatic; sclera clear, conjunctiva pink; hearing intact; oropharynx clear; neck supple, no JVP Lymph- no cervical lymphadenopathy Lungs- Clear to ausculation bilaterally, normal work of breathing.  No wheezes, rales, rhonchi Heart- Regular rate and rhythm, no murmurs, rubs or gallops, PMI not laterally displaced GI- soft, non-tender, non-distended, bowel sounds present, no hepatosplenomegaly Extremities- no clubbing, cyanosis, or edema; DP/PT/radial pulses 2+ bilaterally MS- no significant deformity or atrophy Skin- warm and dry, no rash or lesion Psych- euthymic mood, full affect Neuro- strength and sensation are intact   Labs:   Lab Results  Component Value Date   WBC 9.9 05/29/2021   HGB 14.6 05/29/2021   HCT 45.1 05/29/2021   MCV 90.7 05/29/2021   PLT 210 05/29/2021    Recent Labs  Lab 06/01/21 0125  NA 135  K 3.7  CL 105  CO2 22  BUN 21*  CREATININE 1.13  CALCIUM 9.2  GLUCOSE 145*     Discharge Medications:  Allergies as of 06/01/2021   No Known Allergies      Medication List     TAKE these medications    apixaban 5 MG Tabs tablet Commonly known as: Eliquis Take 1 tablet (5 mg total) by mouth 2 (two) times daily. Resume 05/17/21 in the morning.   atorvastatin 80 MG tablet Commonly known as: LIPITOR Take 1 tablet (80 mg total) by mouth at bedtime.  clopidogrel 75 MG tablet Commonly known as: PLAVIX Take 1 tablet (75 mg total) by mouth daily with breakfast.   dapagliflozin propanediol 10 MG Tabs tablet Commonly known as: FARXIGA Take 10 mg by mouth in the morning.   dofetilide 500 MCG capsule Commonly known as: TIKOSYN Take 1 capsule (500 mcg total) by mouth 2 (two) times daily.   ferrous sulfate 325 (65 FE) MG tablet Take 325 mg by mouth daily.   furosemide 40 MG tablet Commonly known as: LASIX Take 40 mg by mouth daily.   glimepiride 4 MG tablet Commonly known as: AMARYL Take 4 mg by mouth daily with  breakfast.   isosorbide mononitrate 30 MG 24 hr tablet Commonly known as: IMDUR Take 30 mg by mouth in the morning.   Januvia 100 MG tablet Generic drug: sitaGLIPtin Take 100 mg by mouth in the morning.   losartan 50 MG tablet Commonly known as: COZAAR Take 50 mg by mouth every evening.   metFORMIN 1000 MG tablet Commonly known as: GLUCOPHAGE Take 1 tablet (1,000 mg total) by mouth 2 (two) times daily with a meal. Resume on 05/19/21   multivitamin with minerals tablet Take 1 tablet by mouth 2 (two) times daily.   Nebivolol HCl 20 MG Tabs Commonly known as: Bystolic Take 1 tablet (20 mg total) by mouth every morning.   nitroGLYCERIN 0.4 MG SL tablet Commonly known as: NITROSTAT Place 1 tablet (0.4 mg total) under the tongue every 5 (five) minutes as needed for chest pain.   potassium chloride SA 20 MEQ tablet Commonly known as: KLOR-CON M Take 2 tablets (40 mEq total) by mouth daily. What changed: how much to take   VITAMIN D-3 PO Take 1 tablet by mouth in the morning.        Disposition:    Follow-up Information     [facility] Follow up.   Specialty: Cardiology Why: on 12/8 at 1130 for post hospital tikosyn check Contact information: [address] [phone] [place] [address] [phone]                Duration of Discharge Encounter: Greater than 30 minutes including physician time.  Dustin Flock, [name]  06/01/2021 11:27 AM

## 2021-06-05 ENCOUNTER — Other Ambulatory Visit (HOSPITAL_COMMUNITY): Payer: Self-pay

## 2021-06-07 ENCOUNTER — Other Ambulatory Visit: Payer: Self-pay

## 2021-06-07 ENCOUNTER — Ambulatory Visit (HOSPITAL_COMMUNITY)
Admission: RE | Admit: 2021-06-07 | Discharge: 2021-06-07 | Disposition: A | Discharge status: Home / Self Care | Payer: Federal, State, Local not specified - PPO | Source: Ambulatory Visit | Attending: Nurse Practitioner | Admitting: Nurse Practitioner

## 2021-06-07 ENCOUNTER — Encounter (HOSPITAL_COMMUNITY): Payer: Self-pay | Admitting: Nurse Practitioner

## 2021-06-07 VITALS — BP 124/74 | HR 70 | Ht 72.0 in | Wt 235.0 lb

## 2021-06-07 DIAGNOSIS — I5022 Chronic systolic (congestive) heart failure: Secondary | ICD-10-CM | POA: Insufficient documentation

## 2021-06-07 DIAGNOSIS — I110 Hypertensive heart disease with heart failure: Secondary | ICD-10-CM | POA: Insufficient documentation

## 2021-06-07 DIAGNOSIS — I2510 Atherosclerotic heart disease of native coronary artery without angina pectoris: Secondary | ICD-10-CM | POA: Diagnosis not present

## 2021-06-07 DIAGNOSIS — Z7901 Long term (current) use of anticoagulants: Secondary | ICD-10-CM | POA: Diagnosis not present

## 2021-06-07 DIAGNOSIS — E785 Hyperlipidemia, unspecified: Secondary | ICD-10-CM | POA: Diagnosis not present

## 2021-06-07 DIAGNOSIS — Z6831 Body mass index (BMI) 31.0-31.9, adult: Secondary | ICD-10-CM | POA: Diagnosis not present

## 2021-06-07 DIAGNOSIS — E669 Obesity, unspecified: Secondary | ICD-10-CM | POA: Diagnosis not present

## 2021-06-07 DIAGNOSIS — E119 Type 2 diabetes mellitus without complications: Secondary | ICD-10-CM | POA: Insufficient documentation

## 2021-06-07 DIAGNOSIS — D6869 Other thrombophilia: Secondary | ICD-10-CM | POA: Insufficient documentation

## 2021-06-07 DIAGNOSIS — Z87891 Personal history of nicotine dependence: Secondary | ICD-10-CM | POA: Diagnosis not present

## 2021-06-07 DIAGNOSIS — I4819 Other persistent atrial fibrillation: Secondary | ICD-10-CM | POA: Diagnosis not present

## 2021-06-07 LAB — BASIC METABOLIC PANEL
Anion gap: 9 (ref 5–15)
BUN: 16 mg/dL (ref 6–20)
CO2: 24 mmol/L (ref 22–32)
Calcium: 9.7 mg/dL (ref 8.9–10.3)
Chloride: 102 mmol/L (ref 98–111)
Creatinine, Ser: 0.98 mg/dL (ref 0.61–1.24)
GFR, Estimated: >60 mL/min (ref >60)
Glucose, Bld: 171 mg/dL — ABNORMAL HIGH (ref 70–99)
Potassium: 4.6 mmol/L (ref 3.5–5.1)
Sodium: 135 mmol/L (ref 135–145)

## 2021-06-07 LAB — MAGNESIUM: Magnesium: 2 mg/dL (ref 1.7–2.4)

## 2021-06-07 NOTE — Addendum Note (Signed)
Encounter addended by: Newman Nip, NP on: 06/07/2021 12:59 PM  Actions taken: Clinical Note Signed, Medication List reviewed, Problem List reviewed, Allergies reviewed

## 2021-06-07 NOTE — Progress Notes (Addendum)
Primary Care Physician: Gareth MorganKnowlton, Steve, MD Primary Cardiologist: Dr Royann Shiversroitoru  Primary Electrophysiologist: Dr Johney FrameAllred Referring Physician: Dr Johney FrameAllred   Letta Kochertha B Berghuis is a 60 y.o. male with a history of HTN, CAD, chronic systolic CHF, DM, HLD, atrial fibrillation who presents for follow up in the [facility]. Patient is on Eliquis for a CHADS2VASC score of 4. He was seen by Dr Johney FrameAllred 05/07/21 and had been in afib persistently for months. Echo showed new reduced EF at 30%. He also had PCI 05/16/21. Because of holding anticoagulation for LHC, ablation for afib was not pursued. Dofetilide was recommended with TEE DCCV prior.   Patient presents today for TEE/DCCV and dofetilide loading. He continues to have symptoms of fatigue and SOB despite good rate control. He denies any missed doses of anticoagulation since LHC.   F/u in afib clinic, 06/07/21. He is in SR. He did require cardioversion with tikosyn loading. No issues today.   Today, he denies symptoms of palpitations, chest pain, orthopnea, PND, lower extremity edema, dizziness, presyncope, syncope, snoring, daytime somnolence, bleeding, or neurologic sequela. The patient is tolerating medications without difficulties and is otherwise without complaint today.    Atrial Fibrillation Risk Factors:  he does not have symptoms or diagnosis of sleep apnea. he does not have a history of rheumatic fever. he does not have a history of alcohol use.   he has a BMI of Body mass index is 31.87 kg/m.Marland Kitchen. Filed Weights   06/07/21 1115  Weight: 106.6 kg    Family History  Problem Relation Age of Onset   CAD Father    Vascular Disease Father        carotid artery disease     Atrial Fibrillation Management history:  Previous antiarrhythmic drugs: dofetilide  Previous cardioversions: 05/29/21 Previous ablations: 2019 CHADS2VASC score: 4 Anticoagulation history: Eliquis   Past Medical History:  Diagnosis Date    Abdominal distention    Anemia    Postoperative   Chronic diastolic CHF (congestive heart failure) (HCC)    Coronary artery disease    a. premature - acute MI age 60 s/p BMS to prox LAD. b. s/p DES to RCA, PLA, mLAD in 2007. c. 4V CABG in 2008 with LIMA to LAD, SVG to first diagonal, SVG to first OM, SVG to PDA   Diabetes (HCC)    Edema    H/O gastroesophageal reflux (GERD)    High cholesterol    Hypertension    Myocardial infarction (HCC)    Obesity    Persistent atrial fibrillation (HCC)    a. on Tikosyn, Xarelto.   Pleural effusion    resolved with therapy   Past Surgical History:  Procedure Laterality Date   ABLATION OF DYSRHYTHMIC FOCUS  09/23/2017   ATRIAL FIBRILLATION ABLATION N/A 09/23/2017   Procedure: ATRIAL FIBRILLATION ABLATION;  Surgeon: Hillis RangeAllred, James, MD;  Location: [facility] INVASIVE CV LAB;  Service: Cardiovascular;  Laterality: N/A;   BUBBLE STUDY  05/29/2021   Procedure: BUBBLE STUDY;  Surgeon: Chrystie NoseHilty, Kenneth C, MD;  [facility];  Service: Cardiovascular;;   CARDIAC CATHETERIZATION  2008   CARDIOVERSION N/A 05/29/2021   Procedure: CARDIOVERSION;  Surgeon: Chrystie NoseHilty, Kenneth C, MD;  [facility];  Service: Cardiovascular;  Laterality: N/A;   CORONARY ANGIOPLASTY  1996   CORONARY ARTERY BYPASS GRAFT  2008   LIMA-LAD, SVG-Dx, SVG-OM1, SVG-PDA   CORONARY ATHERECTOMY N/A 05/16/2021   Procedure: CORONARY ATHERECTOMY;  Surgeon: Corky CraftsVaranasi, Jayadeep S, MD;  [facility];  Service: Cardiovascular;  Laterality: N/A;   CORONARY STENT INTERVENTION N/A 05/01/2021   Procedure: CORONARY STENT INTERVENTION;  Surgeon: Corky Crafts, MD;  Location: [facility] INVASIVE CV LAB;  Service: Cardiovascular;  Laterality: N/A;   CORONARY STENT INTERVENTION N/A 05/16/2021   Procedure: CORONARY STENT INTERVENTION;  Surgeon: Corky Crafts, MD;  Location: [facility] INVASIVE CV LAB;  Service: Cardiovascular;  Laterality: N/A;   INTRAVASCULAR ULTRASOUND/IVUS N/A 05/16/2021    Procedure: Intravascular Ultrasound/IVUS;  Surgeon: Corky Crafts, MD;  Location: [facility] INVASIVE CV LAB;  Service: Cardiovascular;  Laterality: N/A;   LEFT HEART CATH N/A 05/16/2021   Procedure: Left Heart Cath;  Surgeon: Corky Crafts, MD;  Location: [facility] INVASIVE CV LAB;  Service: Cardiovascular;  Laterality: N/A;   LEFT HEART CATH AND CORS/GRAFTS ANGIOGRAPHY N/A 09/25/2016   Procedure: Left Heart Cath and Cors/Grafts Angiography;  Surgeon: Corky Crafts, MD;  Location: [facility] INVASIVE CV LAB;  Service: Cardiovascular;  Laterality: N/A;   LEFT HEART CATH AND CORS/GRAFTS ANGIOGRAPHY N/A 05/01/2021   Procedure: LEFT HEART CATH AND CORS/GRAFTS ANGIOGRAPHY;  Surgeon: Corky Crafts, MD;  Location: [facility] INVASIVE CV LAB;  Service: Cardiovascular;  Laterality: N/A;   TEE WITHOUT CARDIOVERSION N/A 05/29/2021   Procedure: TRANSESOPHAGEAL ECHOCARDIOGRAM (TEE);  Surgeon: [name], MD;  [facility] ENDOSCOPY;  Service: Cardiovascular;  Laterality: N/A;    Current Outpatient Medications  Medication Sig Dispense Refill   apixaban (ELIQUIS) 5 MG TABS tablet Take 1 tablet (5 mg total) by mouth 2 (two) times daily. Resume 05/17/21 in the morning. 60 tablet 5   atorvastatin (LIPITOR) 80 MG tablet Take 1 tablet (80 mg total) by mouth at bedtime. 90 tablet 3   Cholecalciferol (VITAMIN D-3 PO) Take 1 tablet by mouth in the morning.     clopidogrel (PLAVIX) 75 MG tablet Take 1 tablet (75 mg total) by mouth daily with breakfast. 30 tablet 5   dapagliflozin propanediol (FARXIGA) 10 MG TABS tablet Take 10 mg by mouth in the morning.     dofetilide (TIKOSYN) 500 MCG capsule Take 1 capsule (500 mcg total) by mouth 2 (two) times daily. 60 capsule 6   ferrous sulfate 325 (65 FE) MG tablet Take 325 mg by mouth daily.     furosemide (LASIX) 40 MG tablet Take 40 mg by mouth daily.     glimepiride (AMARYL) 4 MG tablet Take 4 mg by mouth daily with breakfast.     isosorbide mononitrate (IMDUR) 30 MG  24 hr tablet Take 30 mg by mouth in the morning.     JANUVIA 100 MG tablet Take 100 mg by mouth in the morning.     losartan (COZAAR) 50 MG tablet Take 50 mg by mouth every evening.     metFORMIN (GLUCOPHAGE) 1000 MG tablet Take 1 tablet (1,000 mg total) by mouth 2 (two) times daily with a meal. Resume on 05/19/21     Multiple Vitamins-Minerals (MULTIVITAMIN WITH MINERALS) tablet Take 1 tablet by mouth 2 (two) times daily.      Nebivolol HCl (BYSTOLIC) 20 MG TABS Take 1 tablet (20 mg total) by mouth every morning. 90 tablet 3   nitroGLYCERIN (NITROSTAT) 0.4 MG SL tablet Place 1 tablet (0.4 mg total) under the tongue every 5 (five) minutes as needed for chest pain. 25 tablet 4   potassium chloride SA (KLOR-CON M) 20 MEQ tablet Take 2 tablets (40 mEq total) by mouth daily. 60 tablet 6   No current facility-administered medications for this  encounter.    No Known Allergies  Social History   Socioeconomic History   Marital status: Married    Spouse name: Not on file   Number of children: Not on file   Years of education: Not on file   Highest education level: Not on file  Occupational History   Occupation: Regulatory affairs officer    Employer: [employer]   Occupation: Has cattle and Sales promotion account executive.  Tobacco Use   Smoking status: Former    Packs/day: 2.00    Years: 30.00    Pack years: 60.00    Types: Cigarettes   Smokeless tobacco: Never   Tobacco comments:    Former smoker 05/29/2021  Vaping Use   Vaping Use: Never used  Substance and Sexual Activity   Alcohol use: Yes    Alcohol/week: 1.0 standard drink    Types: 1 Standard drinks or equivalent per week    Comment: rare   Drug use: No   Sexual activity: Not on file  Other Topics Concern   Not on file  Social History Narrative   Not on file   Social Determinants of Health   Financial Resource Strain: Not on file  Food Insecurity: Not on file  Transportation Needs: Not on file  Physical Activity: Not on file  Stress: Not on  file  Social Connections: Not on file  Intimate Partner Violence: Not on file     ROS- All systems are reviewed and negative except as per the HPI above.  Physical Exam: Vitals:   06/07/21 1115  BP: 124/74  Pulse: 70  Weight: 106.6 kg  Height: 6' (1.829 m)    GEN- The patient is a well appearing obese male, alert and oriented x 3 today.   Head- normocephalic, atraumatic Eyes-  Sclera clear, conjunctiva pink Ears- hearing intact Oropharynx- clear Neck- supple  Lungs- Clear to ausculation bilaterally, normal work of breathing Heart-regular rate and rhythm, no murmurs, rubs or gallops  GI- soft, NT, ND, + BS Extremities- no clubbing, cyanosis, or edema MS- no significant deformity or atrophy Skin- no rash or lesion Psych- euthymic mood, full affect Neuro- strength and sensation are intact  Wt Readings from Last 3 Encounters:  06/07/21 106.6 kg  05/29/21 106.1 kg  05/29/21 106.6 kg    EKG today demonstrates  NSR at 70 bpm, pr int 160 ms, qrs int 90 ms, qtc 451 ms   Echo 04/16/21 demonstrated   1. Left ventricular ejection fraction, by estimation, is 30 to 35%. The  left ventricle has moderately decreased function. The left ventricle  demonstrates global hypokinesis. There is moderate asymmetric left  ventricular hypertrophy of the posterior-lateral segment. Left ventricular diastolic parameters are indeterminate.   2. Right ventricular systolic function is moderately reduced. The right  ventricular size is normal.   3. Left atrial size was mild to moderately dilated.   4. Right atrial size was mildly dilated.   5. The mitral valve is grossly normal. Trivial mitral valve  regurgitation. No evidence of mitral stenosis.   6. The aortic valve is abnormal. There is moderate calcification of the  aortic valve. Aortic valve regurgitation is trivial. Mild to moderate  aortic valve sclerosis/calcification is present, without any evidence of  aortic stenosis.   7. The  inferior vena cava is normal in size with <50% respiratory  variability, suggesting right atrial pressure of 8 mmHg.   Epic records are reviewed at length today  CHA2DS2-VASc Score = 4  The patient's score is based upon:  CHF History: 1 HTN History: 1 Diabetes History: 1 Stroke History: 0 Vascular Disease History: 1 Age Score: 0 Gender Score: 0      ASSESSMENT AND PLAN: 1. Persistent Atrial Fibrillation (ICD10:  I48.19) The patient's CHA2DS2-VASc score is 4, indicating a 4.8% annual risk of stroke.   Patient  is now s/p successful tikosyn loading/ cardioversion  Qtc is stable at 451 ms  Tikosyn precautions reviewed  Continue Eliquis 5 mg BID  No  benadryl use  Bmet/mag today   2. Secondary Hypercoagulable State (ICD10:  D68.69) The patient is at significant risk for stroke/thromboembolism based upon his CHA2DS2-VASc Score of 4.  Continue Apixaban (Eliquis).   3. Obesity Body mass index is 31.87 kg/m. Lifestyle modification was discussed at length including regular exercise and weight reduction.  4. HTN Stable, no changes today.  5. CAD Recent PCI on 11/16. On Plavix No anginal symptoms.  6. Chronic systolic CHF EF [id number]. No symptoms of fluid overload today.  F/u requested with Dr. Loletha Grayer as pt has not been able to secure a f/u appointment from stent placement  F/u in afib clinic in one month   Butch Penny C. Brittney Mucha, [facility] [address] [place], [place] [address] [phone]

## 2021-06-18 DIAGNOSIS — Z20822 Contact with and (suspected) exposure to covid-19: Secondary | ICD-10-CM | POA: Diagnosis not present

## 2021-06-18 DIAGNOSIS — Z03818 Encounter for observation for suspected exposure to other biological agents ruled out: Secondary | ICD-10-CM | POA: Diagnosis not present

## 2021-07-09 ENCOUNTER — Other Ambulatory Visit: Payer: Self-pay

## 2021-07-09 ENCOUNTER — Ambulatory Visit (HOSPITAL_COMMUNITY)
Admission: RE | Admit: 2021-07-09 | Discharge: 2021-07-09 | Disposition: A | Discharge status: Home / Self Care | Payer: Federal, State, Local not specified - PPO | Source: Ambulatory Visit | Attending: Physician Assistant | Admitting: Physician Assistant

## 2021-07-09 ENCOUNTER — Encounter (HOSPITAL_COMMUNITY): Payer: Self-pay | Admitting: Physician Assistant

## 2021-07-09 VITALS — BP 122/88 | HR 75 | Ht 72.0 in | Wt 233.2 lb

## 2021-07-09 DIAGNOSIS — Z7901 Long term (current) use of anticoagulants: Secondary | ICD-10-CM | POA: Insufficient documentation

## 2021-07-09 DIAGNOSIS — Z955 Presence of coronary angioplasty implant and graft: Secondary | ICD-10-CM | POA: Insufficient documentation

## 2021-07-09 DIAGNOSIS — Z79899 Other long term (current) drug therapy: Secondary | ICD-10-CM | POA: Insufficient documentation

## 2021-07-09 DIAGNOSIS — I110 Hypertensive heart disease with heart failure: Secondary | ICD-10-CM | POA: Insufficient documentation

## 2021-07-09 DIAGNOSIS — I4819 Other persistent atrial fibrillation: Secondary | ICD-10-CM

## 2021-07-09 DIAGNOSIS — E785 Hyperlipidemia, unspecified: Secondary | ICD-10-CM | POA: Diagnosis not present

## 2021-07-09 DIAGNOSIS — D6869 Other thrombophilia: Secondary | ICD-10-CM | POA: Diagnosis not present

## 2021-07-09 DIAGNOSIS — E119 Type 2 diabetes mellitus without complications: Secondary | ICD-10-CM | POA: Insufficient documentation

## 2021-07-09 DIAGNOSIS — E669 Obesity, unspecified: Secondary | ICD-10-CM | POA: Insufficient documentation

## 2021-07-09 DIAGNOSIS — Z6831 Body mass index (BMI) 31.0-31.9, adult: Secondary | ICD-10-CM | POA: Diagnosis not present

## 2021-07-09 DIAGNOSIS — I5022 Chronic systolic (congestive) heart failure: Secondary | ICD-10-CM | POA: Insufficient documentation

## 2021-07-09 DIAGNOSIS — I2510 Atherosclerotic heart disease of native coronary artery without angina pectoris: Secondary | ICD-10-CM | POA: Insufficient documentation

## 2021-07-09 LAB — BASIC METABOLIC PANEL
Anion gap: 10 (ref 5–15)
BUN: 24 mg/dL — ABNORMAL HIGH (ref 6–20)
CO2: 23 mmol/L (ref 22–32)
Calcium: 9.7 mg/dL (ref 8.9–10.3)
Chloride: 104 mmol/L (ref 98–111)
Creatinine, Ser: 1.04 mg/dL (ref 0.61–1.24)
GFR, Estimated: >60 mL/min (ref >60)
Glucose, Bld: 158 mg/dL — ABNORMAL HIGH (ref 70–99)
Potassium: 4.5 mmol/L (ref 3.5–5.1)
Sodium: 137 mmol/L (ref 135–145)

## 2021-07-09 LAB — MAGNESIUM: Magnesium: 2 mg/dL (ref 1.7–2.4)

## 2021-07-09 NOTE — Progress Notes (Signed)
Primary Care Physician: Gareth MorganKnowlton, Steve, MD Primary Cardiologist: Dr Royann Shiversroitoru  Primary Electrophysiologist: Dr Johney FrameAllred Referring Physician: Dr Johney FrameAllred   [name] is a 61 y.o. male with a history of HTN, CAD, chronic systolic CHF, DM, HLD, atrial fibrillation who presents for follow up in the [facility]. Patient is on Eliquis for a CHADS2VASC score of 4. He was seen by Dr Johney FrameAllred 05/07/21 and had been in afib persistently for months. Echo showed new reduced EF at 30%. He also had PCI 05/16/21. Because of holding anticoagulation for LHC, ablation for afib was not pursued. Dofetilide was recommended with TEE DCCV prior.   On follow up today, patient reports that he had two episodes of an irregular rhythm during the middle of the night but otherwise has been staying in SR. He denies significant snoring or daytime somnolence. He is in SR today.   Today, he denies symptoms of chest pain, orthopnea, PND, lower extremity edema, dizziness, presyncope, syncope, snoring, daytime somnolence, bleeding, or neurologic sequela. The patient is tolerating medications without difficulties and is otherwise without complaint today.    Atrial Fibrillation Risk Factors:  he does not have symptoms or diagnosis of sleep apnea. he does not have a history of rheumatic fever. he does not have a history of alcohol use.   he has a BMI of Body mass index is 31.63 kg/m.Marland Kitchen. Filed Weights   07/09/21 0859  Weight: 105.8 kg     Family History  Problem Relation Age of Onset   CAD Father    Vascular Disease Father        carotid artery disease     Atrial Fibrillation Management history:  Previous antiarrhythmic drugs: dofetilide  Previous cardioversions: 05/29/21 Previous ablations: 2019 CHADS2VASC score: 4 Anticoagulation history: Eliquis   Past Medical History:  Diagnosis Date   Abdominal distention    Anemia    Postoperative   Chronic diastolic CHF (congestive heart  failure) (HCC)    Coronary artery disease    a. premature - acute MI age 61 s/p BMS to prox LAD. b. s/p DES to RCA, PLA, mLAD in 2007. c. 4V CABG in 2008 with LIMA to LAD, SVG to first diagonal, SVG to first OM, SVG to PDA   Diabetes (HCC)    Edema    H/O gastroesophageal reflux (GERD)    High cholesterol    Hypertension    Myocardial infarction (HCC)    Obesity    Persistent atrial fibrillation (HCC)    a. on Tikosyn, Xarelto.   Pleural effusion    resolved with therapy   Past Surgical History:  Procedure Laterality Date   ABLATION OF DYSRHYTHMIC FOCUS  09/23/2017   ATRIAL FIBRILLATION ABLATION N/A 09/23/2017   Procedure: ATRIAL FIBRILLATION ABLATION;  Surgeon: Hillis RangeAllred, James, MD;  Location: [facility] INVASIVE CV LAB;  Service: Cardiovascular;  Laterality: N/A;   BUBBLE STUDY  05/29/2021   Procedure: BUBBLE STUDY;  Surgeon: Chrystie NoseHilty, Kenneth C, MD;  [facility];  Service: Cardiovascular;;   CARDIAC CATHETERIZATION  2008   CARDIOVERSION N/A 05/29/2021   Procedure: CARDIOVERSION;  Surgeon: Chrystie NoseHilty, Kenneth C, MD;  [facility];  Service: Cardiovascular;  Laterality: N/A;   CORONARY ANGIOPLASTY  1996   CORONARY ARTERY BYPASS GRAFT  2008   LIMA-LAD, SVG-Dx, SVG-OM1, SVG-PDA   CORONARY ATHERECTOMY N/A 05/16/2021   Procedure: CORONARY ATHERECTOMY;  Surgeon: Corky CraftsVaranasi, Jayadeep S, MD;  [facility];  Service: Cardiovascular;  Laterality: N/A;   CORONARY  STENT INTERVENTION N/A 05/01/2021   Procedure: CORONARY STENT INTERVENTION;  Surgeon: Corky Crafts, MD;  Location: [facility] INVASIVE CV LAB;  Service: Cardiovascular;  Laterality: N/A;   CORONARY STENT INTERVENTION N/A 05/16/2021   Procedure: CORONARY STENT INTERVENTION;  Surgeon: Corky Crafts, MD;  Location: [facility] INVASIVE CV LAB;  Service: Cardiovascular;  Laterality: N/A;   INTRAVASCULAR ULTRASOUND/IVUS N/A 05/16/2021   Procedure: Intravascular Ultrasound/IVUS;  Surgeon: Corky Crafts, MD;  Location:  [facility] INVASIVE CV LAB;  Service: Cardiovascular;  Laterality: N/A;   LEFT HEART CATH N/A 05/16/2021   Procedure: Left Heart Cath;  Surgeon: Corky Crafts, MD;  Location: [facility] INVASIVE CV LAB;  Service: Cardiovascular;  Laterality: N/A;   LEFT HEART CATH AND CORS/GRAFTS ANGIOGRAPHY N/A 09/25/2016   Procedure: Left Heart Cath and Cors/Grafts Angiography;  Surgeon: Corky Crafts, MD;  Location: [facility] INVASIVE CV LAB;  Service: Cardiovascular;  Laterality: N/A;   LEFT HEART CATH AND CORS/GRAFTS ANGIOGRAPHY N/A 05/01/2021   Procedure: LEFT HEART CATH AND CORS/GRAFTS ANGIOGRAPHY;  Surgeon: Corky Crafts, MD;  Location: [facility] INVASIVE CV LAB;  Service: Cardiovascular;  Laterality: N/A;   TEE WITHOUT CARDIOVERSION N/A 05/29/2021   Procedure: TRANSESOPHAGEAL ECHOCARDIOGRAM (TEE);  Surgeon: [name], MD;  Location: [facility] ENDOSCOPY;  Service: Cardiovascular;  Laterality: N/A;    Current Outpatient Medications  Medication Sig Dispense Refill   apixaban (ELIQUIS) 5 MG TABS tablet Take 1 tablet (5 mg total) by mouth 2 (two) times daily. Resume 05/17/21 in the morning. 60 tablet 5   atorvastatin (LIPITOR) 80 MG tablet Take 1 tablet (80 mg total) by mouth at bedtime. 90 tablet 3   Cholecalciferol (VITAMIN D-3 PO) Take 1 tablet by mouth in the morning.     clopidogrel (PLAVIX) 75 MG tablet Take 1 tablet (75 mg total) by mouth daily with breakfast. 30 tablet 5   dapagliflozin propanediol (FARXIGA) 10 MG TABS tablet Take 10 mg by mouth in the morning.     dofetilide (TIKOSYN) 500 MCG capsule Take 1 capsule (500 mcg total) by mouth 2 (two) times daily. 60 capsule 6   ferrous sulfate 325 (65 FE) MG tablet Take 325 mg by mouth daily.     furosemide (LASIX) 40 MG tablet Take 40 mg by mouth daily.     glimepiride (AMARYL) 4 MG tablet Take 4 mg by mouth daily with breakfast.     isosorbide mononitrate (IMDUR) 30 MG 24 hr tablet Take 30 mg by mouth in the morning.     JANUVIA 100 MG tablet Take 100 mg by  mouth in the morning.     losartan (COZAAR) 50 MG tablet Take 50 mg by mouth every evening.     metFORMIN (GLUCOPHAGE) 1000 MG tablet Take 1 tablet (1,000 mg total) by mouth 2 (two) times daily with a meal. Resume on 05/19/21     Multiple Vitamins-Minerals (MULTIVITAMIN WITH MINERALS) tablet Take 1 tablet by mouth 2 (two) times daily.      Nebivolol HCl (BYSTOLIC) 20 MG TABS Take 1 tablet (20 mg total) by mouth every morning. 90 tablet 3   nitroGLYCERIN (NITROSTAT) 0.4 MG SL tablet Place 1 tablet (0.4 mg total) under the tongue every 5 (five) minutes as needed for chest pain. 25 tablet 4   potassium chloride SA (KLOR-CON M) 20 MEQ tablet Take 2 tablets (40 mEq total) by mouth daily. 60 tablet 6   No current facility-administered medications for this encounter.    No Known Allergies  Social History  Socioeconomic History   Marital status: Married    Spouse name: Not on file   Number of children: Not on file   Years of education: Not on file   Highest education level: Not on file  Occupational History   Occupation: Regulatory affairs officer    Employer: [employer]   Occupation: Has cattle and Sales promotion account executive.  Tobacco Use   Smoking status: Former    Packs/day: 2.00    Years: 30.00    Pack years: 60.00    Types: Cigarettes   Smokeless tobacco: Never   Tobacco comments:    Former smoker 05/29/2021  Vaping Use   Vaping Use: Never used  Substance and Sexual Activity   Alcohol use: Yes    Alcohol/week: 1.0 standard drink    Types: 1 Standard drinks or equivalent per week    Comment: rare   Drug use: No   Sexual activity: Not on file  Other Topics Concern   Not on file  Social History Narrative   Not on file   Social Determinants of Health   Financial Resource Strain: Not on file  Food Insecurity: Not on file  Transportation Needs: Not on file  Physical Activity: Not on file  Stress: Not on file  Social Connections: Not on file  Intimate Partner Violence: Not on file     ROS-  All systems are reviewed and negative except as per the HPI above.  Physical Exam: Vitals:   07/09/21 0859  BP: 122/88  Pulse: 75  Weight: 105.8 kg  Height: 6' (1.829 m)    GEN- The patient is a well appearing obese male, alert and oriented x 3 today.   HEENT-head normocephalic, atraumatic, sclera clear, conjunctiva pink, hearing intact, trachea midline. Lungs- Clear to ausculation bilaterally, normal work of breathing Heart- Regular rate and rhythm, no murmurs, rubs or gallops  GI- soft, NT, ND, + BS Extremities- no clubbing, cyanosis, or edema MS- no significant deformity or atrophy Skin- no rash or lesion Psych- euthymic mood, full affect Neuro- strength and sensation are intact   Wt Readings from Last 3 Encounters:  07/09/21 105.8 kg  06/07/21 106.6 kg  05/29/21 106.1 kg    EKG today demonstrates  SR Vent. rate 75 BPM PR interval 158 ms QRS duration 88 ms QT/QTcB 398/444 ms  Echo 04/16/21 demonstrated   1. Left ventricular ejection fraction, by estimation, is 30 to 35%. The  left ventricle has moderately decreased function. The left ventricle  demonstrates global hypokinesis. There is moderate asymmetric left  ventricular hypertrophy of the posterior-lateral segment. Left ventricular diastolic parameters are indeterminate.   2. Right ventricular systolic function is moderately reduced. The right  ventricular size is normal.   3. Left atrial size was mild to moderately dilated.   4. Right atrial size was mildly dilated.   5. The mitral valve is grossly normal. Trivial mitral valve  regurgitation. No evidence of mitral stenosis.   6. The aortic valve is abnormal. There is moderate calcification of the  aortic valve. Aortic valve regurgitation is trivial. Mild to moderate  aortic valve sclerosis/calcification is present, without any evidence of  aortic stenosis.   7. The inferior vena cava is normal in size with <50% respiratory  variability, suggesting right  atrial pressure of 8 mmHg.   Epic records are reviewed at length today  CHA2DS2-VASc Score = 4  The patient's score is based upon: CHF History: 1 HTN History: 1 Diabetes History: 1 Stroke History: 0 Vascular Disease History: 1  Age Score: 0 Gender Score: 0       ASSESSMENT AND PLAN: 1. Persistent Atrial Fibrillation (ICD10:  I48.19) The patient's CHA2DS2-VASc score is 4, indicating a 4.8% annual risk of stroke.   S/p dofetilide loading 11/29-12/2/22 Continue dofetilide 500 mcg BID. QT stable. Check bmet/mag today. Continue Eliquis 5 mg BID   2. Secondary Hypercoagulable State (ICD10:  D68.69) The patient is at significant risk for stroke/thromboembolism based upon his CHA2DS2-VASc Score of 4.  Continue Apixaban (Eliquis).   3. Obesity Body mass index is 31.63 kg/m. Lifestyle modification was discussed and encouraged including regular physical activity and weight reduction.  4. HTN Stable, no changes today.  5. CAD Recent PCI on 11/16. On Plavix No anginal symptoms.  6. Chronic systolic CHF EF [id number].  No signs or symptoms of fluid overload. Hopefully his EF will recover with SR.   Follow up with Dr Sallyanne Kuster as scheduled. AF clinic in 3 months.    [facility] [address] [place], [place] [address] [phone]

## 2021-07-18 ENCOUNTER — Ambulatory Visit: Payer: Federal, State, Local not specified - PPO | Admitting: Cardiovascular Disease

## 2021-07-18 ENCOUNTER — Other Ambulatory Visit: Payer: Self-pay

## 2021-07-18 ENCOUNTER — Encounter: Payer: Self-pay | Admitting: Cardiovascular Disease

## 2021-07-18 VITALS — BP 130/72 | HR 76 | Ht 72.0 in | Wt 220.0 lb

## 2021-07-18 DIAGNOSIS — Z5181 Encounter for therapeutic drug level monitoring: Secondary | ICD-10-CM

## 2021-07-18 DIAGNOSIS — I25118 Atherosclerotic heart disease of native coronary artery with other forms of angina pectoris: Secondary | ICD-10-CM

## 2021-07-18 DIAGNOSIS — I10 Essential (primary) hypertension: Secondary | ICD-10-CM

## 2021-07-18 DIAGNOSIS — I480 Paroxysmal atrial fibrillation: Secondary | ICD-10-CM | POA: Diagnosis not present

## 2021-07-18 DIAGNOSIS — I5032 Chronic diastolic (congestive) heart failure: Secondary | ICD-10-CM

## 2021-07-18 DIAGNOSIS — E119 Type 2 diabetes mellitus without complications: Secondary | ICD-10-CM

## 2021-07-18 DIAGNOSIS — Z79899 Other long term (current) drug therapy: Secondary | ICD-10-CM

## 2021-07-18 DIAGNOSIS — E785 Hyperlipidemia, unspecified: Secondary | ICD-10-CM

## 2021-07-18 DIAGNOSIS — D6869 Other thrombophilia: Secondary | ICD-10-CM

## 2021-07-18 NOTE — Patient Instructions (Signed)
Medication Instructions:  No changes *If you need a refill on your cardiac medications before your next appointment, please call your pharmacy*   Lab Work: None ordered If you have labs (blood work) drawn today and your tests are completely normal, you will receive your results only by: MyChart Message (if you have MyChart) OR A paper copy in the mail If you have any lab test that is abnormal or we need to change your treatment, we will call you to review the results.   Testing/Procedures: None ordered   Follow-Up: At [facility], you and your health needs are our priority.  As part of our continuing mission to provide you with exceptional heart care, we have created designated Provider Care Teams.  These Care Teams include your primary Cardiologist (physician) and Advanced Practice Providers (APPs -  Physician Assistants and Nurse Practitioners) who all work together to provide you with the care you need, when you need it.  We recommend signing up for the patient portal called "MyChart".  Sign up information is provided on this After Visit Summary.  MyChart is used to connect with patients for Virtual Visits (Telemedicine).  Patients are able to view lab/test results, encounter notes, upcoming appointments, etc.  Non-urgent messages can be sent to your provider as well.   To learn more about what you can do with MyChart, go to [email or web address].    Your next appointment:   7 month(s)  The format for your next appointment:   In Person  Provider:   Thurmon Fair, MD

## 2021-07-18 NOTE — Progress Notes (Signed)
Cardiology Office Note    Date:  07/19/2021   ID:  [name], DOB 18-Feb-1961, MRN [id number]  PCP:  Lemmie Evens, MD  Cardiologist:   Sanda Klein, MD   Chief Complaint  Patient presents with   Coronary Artery Disease         History of Present Illness:  [name] is a 61 y.o. male who presents for CAD, atrial fibrillation s/p ablation.  During evaluation for repeat ablation for atrial fibrillation he was found to have reduction in LV ejection fraction down to 30-35%.  Subsequent work-up showed presence of new coronary stenoses.  Initial revascularization procedure on 05/01/2021 was a successful stent to the SVG-OM, but did not really provide any improvement in symptoms.  He returned on 05/16/2021 and underwent rotational atherectomy and eluding stenting of the left main coronary artery (Onyx frontier 3.5x26) and proximal left circumflex coronary artery (Onyx frontier 3.5x26).  Both procedures were performed by Dr. Irish Lack.  After the second revascularization procedure he feels substantially improved.  In fact he feels "better than he did after bypass surgery" which he never felt provided complete relief of symptoms.  After revascularization was completed he started treatment with dofetilide and underwent TEE guided cardioversion on 05/29/2021.  Per that study his ejection fraction had improved to 55-60%.  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  Can do most of his activities without problems, but still has shortness of breath with occasionally (NYHA functional class II.  His weight has been steady at 230-22 pounds, which is what he has estimated to be as optimal "dry weight" on his home scale (notes that our office scale actually measures his weight lower by about 10 pounds).  He takes an extra furosemide tablet about once every 2 weeks and this predictably happens if he eats salty food.  He has not had edema, orthopnea or PND.  He denies any problems  with angina pectoris.  He has not had any bleeding problems, although he does bruise easily.  He is taking apixaban 5 mg twice daily and clopidogrel 75 mg daily.  He is not on aspirin.  Diabetes control has been suboptimal but he believes it is improving. He is compliant with atorvastatin maximum dose and Zetia and fenofibrate.  He has labs scheduled with Dr. Kingsley Plan next week.  He is no longer taking any antiarrhythmics after his ablation.  He has occasional palpitations at night that resolved by the next morning and never bothered him during the day.  He continues to take anticoagulation but is now on Eliquis which he finds is better tolerated than Xarelto.  Continues to have issues with pain in his right knee.  He first presented with coronary disease at age 62 (bare-metal [id number] stent to proximal LAD in the setting of acute myocardial infarction) and had recurrent problems in 2007 (right coronary artery drug-eluting Taxus stents to the proximal RCA 3.0x12 and the mid PLA 2.5x16, followed by same admission placement of 2 drug-eluting Cypher 3.0x13 stents to the mid LAD). Bypass surgery was performed after repeat cardiac catheterization October 2008 showed 50% proximal LAD stent restenosis followed by aneurysmal dilatation and an 85% stenosis of the LAD involving the first diagonal branch as well as moderate lesions in the left circumflex and right coronary artery. He  underwent four-vessel bypass surgery in 2008. (Dr. Cyndia Bent, LIMA to LAD, SVG to first diagonal, SVG to first OM, SVG to PDA).  EF was normal  until 2022 when it dropped to 35%.  Coronary angiography showed all 4 grafts were patent but he had new stenoses in the SVG to OM, upstream in the OM artery preventing retrograde flow to the left circumflex, high-grade stenoses in the left main and proximal left circumflex coronary arteries.  He underwent placement of a drug-eluting stents to the SVG-OM and atherectomy with drug-eluting stent to the left  main coronary artery and proximal left circumflex coronary artery November 2022.  LVEF recovered to 55-60%.  He described classical angina pectoris and shortness of breath before his coronary events. (substernal pressure and burning, different from his reflux pain). Angina has not recurred since his bypass   He did have postoperative paroxysmal atrial fibrillation, recurred in 2014, recorded incidentally during a routine exam. He is aware of the palpitations and thinks that he does a little worse when he is in the arrhythmia. Rate control is excellent, due to chronic treatment with beta blockers. He does not have a history of stroke or TIA or other embolic events and does not have a known bleeding tendency or abnormal bleeding. CHADS2VASC score: at least 3. He failed Multaq therapy but did well for a while on Tikosyn. Underwent RF ablation ([place]) in March 2019 and dofetilide was stopped.  Had recurrent atrial fibrillation 2022.  During evaluation for repeat ablation he was found to have decreased LVEF leading to repeat coronary revascularization.  He was restarted on dofetilide and underwent cardioversion in November 2022.  Additional problems include type 2 diabetes mellitus, obesity, hypertension and gastroesophageal reflux disease. He has mixed hyperlipidemia. He is a former smoker. He may have a history of asbestos exposure when he worked in the [employer]. 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   Past Medical History:  Diagnosis Date   Abdominal distention    Anemia    Postoperative   Chronic diastolic CHF (congestive heart failure) (HCC)    Coronary artery disease    a. premature - acute MI age 24 s/p BMS to prox LAD. b. s/p DES to RCA, PLA, mLAD in 2007. c. 4V CABG in 2008 with LIMA to LAD, SVG to first diagonal, SVG to first OM, SVG to PDA   Diabetes ([place])    Edema    H/O gastroesophageal reflux (GERD)    High cholesterol     Hypertension    Myocardial infarction ([place])    Obesity    Persistent atrial fibrillation ([place])    a. on Tikosyn, Xarelto.   Pleural effusion    resolved with therapy    Past Surgical History:  Procedure Laterality Date   ABLATION OF DYSRHYTHMIC FOCUS  09/23/2017   ATRIAL FIBRILLATION ABLATION N/A 09/23/2017   Procedure: ATRIAL FIBRILLATION ABLATION;  Surgeon: Thompson Grayer, MD;  Location: [place] [facility] LAB;  Service: Cardiovascular;  Laterality: N/A;   BUBBLE STUDY  05/29/2021   Procedure: BUBBLE STUDY;  Surgeon: Pixie Casino, MD;  Location: [facility] ENDOSCOPY;  Service: Cardiovascular;;   CARDIAC CATHETERIZATION  2008   CARDIOVERSION N/A 05/29/2021   Procedure: CARDIOVERSION;  Surgeon: Pixie Casino, MD;  Location: [facility] ENDOSCOPY;  Service: Cardiovascular;  Laterality: N/A;   [place] GRAFT  2008   LIMA-LAD, SVG-Dx, SVG-OM1, SVG-PDA   CORONARY ATHERECTOMY N/A 05/16/2021   Procedure: CORONARY ATHERECTOMY;  Surgeon: Jettie Booze, MD;  Location: [place] CV LAB;  Service: Cardiovascular;  Laterality:  N/A;   CORONARY STENT INTERVENTION N/A 05/01/2021   Procedure: CORONARY STENT INTERVENTION;  Surgeon: Jettie Booze, MD;  Location: [place] CV LAB;  Service: Cardiovascular;  Laterality: N/A;   CORONARY STENT INTERVENTION N/A 05/16/2021   Procedure: CORONARY STENT INTERVENTION;  Surgeon: Jettie Booze, MD;  Location: [place] CV LAB;  Service: Cardiovascular;  Laterality: N/A;   INTRAVASCULAR ULTRASOUND/IVUS N/A 05/16/2021   Procedure: Intravascular Ultrasound/IVUS;  Surgeon: Jettie Booze, MD;  Location: [place] CV LAB;  Service: Cardiovascular;  Laterality: N/A;   LEFT HEART CATH N/A 05/16/2021   Procedure: Left Heart Cath;  Surgeon: Jettie Booze, MD;  Location: [place] [facility] LAB;  Service: Cardiovascular;  Laterality: N/A;   LEFT HEART CATH AND CORS/GRAFTS ANGIOGRAPHY N/A 09/25/2016    Procedure: Left Heart Cath and Cors/Grafts Angiography;  Surgeon: Jettie Booze, MD;  Location: [place] [facility] LAB;  Service: Cardiovascular;  Laterality: N/A;   LEFT HEART CATH AND CORS/GRAFTS ANGIOGRAPHY N/A 05/01/2021   Procedure: LEFT HEART CATH AND CORS/GRAFTS ANGIOGRAPHY;  Surgeon: Jettie Booze, MD;  Location: [place] CV LAB;  Service: Cardiovascular;  Laterality: N/A;   TEE WITHOUT CARDIOVERSION N/A 05/29/2021   Procedure: TRANSESOPHAGEAL ECHOCARDIOGRAM (TEE);  Surgeon: Pixie Casino, MD;  Location: [facility] ENDOSCOPY;  Service: Cardiovascular;  Laterality: N/A;    Current Medications: Outpatient Medications Prior to Visit  Medication Sig Dispense Refill   apixaban (ELIQUIS) 5 MG TABS tablet Take 1 tablet (5 mg total) by mouth 2 (two) times daily. Resume 05/17/21 in the morning. 60 tablet 5   atorvastatin (LIPITOR) 80 MG tablet Take 1 tablet (80 mg total) by mouth at bedtime. 90 tablet 3   Cholecalciferol (VITAMIN D-3 PO) Take 1 tablet by mouth in the morning.     clopidogrel (PLAVIX) 75 MG tablet Take 1 tablet (75 mg total) by mouth daily with breakfast. 30 tablet 5   dapagliflozin propanediol (FARXIGA) 10 MG TABS tablet Take 10 mg by mouth in the morning.     dofetilide (TIKOSYN) 500 MCG capsule Take 1 capsule (500 mcg total) by mouth 2 (two) times daily. 60 capsule 6   ferrous sulfate 325 (65 FE) MG tablet Take 325 mg by mouth daily.     furosemide (LASIX) 40 MG tablet Take 40 mg by mouth daily.     glimepiride (AMARYL) 4 MG tablet Take 4 mg by mouth daily with breakfast.     isosorbide mononitrate (IMDUR) 30 MG 24 hr tablet Take 30 mg by mouth in the morning.     JANUVIA 100 MG tablet Take 100 mg by mouth in the morning.     losartan (COZAAR) 50 MG tablet Take 50 mg by mouth every evening.     metFORMIN (GLUCOPHAGE) 1000 MG tablet Take 1 tablet (1,000 mg total) by mouth 2 (two) times daily with a meal. Resume on 05/19/21     Multiple Vitamins-Minerals (MULTIVITAMIN  WITH MINERALS) tablet Take 1 tablet by mouth 2 (two) times daily.      Nebivolol HCl (BYSTOLIC) 20 MG TABS Take 1 tablet (20 mg total) by mouth every morning. 90 tablet 3   nitroGLYCERIN (NITROSTAT) 0.4 MG SL tablet Place 1 tablet (0.4 mg total) under the tongue every 5 (five) minutes as needed for chest pain. 25 tablet 4   potassium chloride SA (KLOR-CON M) 20 MEQ tablet Take 2 tablets (40 mEq total) by mouth daily. 60 tablet 6   No facility-administered medications prior to visit.  Allergies:   Patient has no known allergies.   Social History   Socioeconomic History   Marital status: Married    Spouse name: Not on file   Number of children: Not on file   Years of education: Not on file   Highest education level: Not on file  Occupational History   Occupation: Regulatory affairs officer    Employer: [employer]   Occupation: Has cattle and Sales promotion account executive.  Tobacco Use   Smoking status: Former    Packs/day: 2.00    Years: 30.00    Pack years: 60.00    Types: Cigarettes   Smokeless tobacco: Never   Tobacco comments:    Former smoker 05/29/2021  Vaping Use   Vaping Use: Never used  Substance and Sexual Activity   Alcohol use: Yes    Alcohol/week: 1.0 standard drink    Types: 1 Standard drinks or equivalent per week    Comment: rare   Drug use: No   Sexual activity: Not on file  Other Topics Concern   Not on file  Social History Narrative   Not on file   Social Determinants of Health   Financial Resource Strain: Not on file  Food Insecurity: Not on file  Transportation Needs: Not on file  Physical Activity: Not on file  Stress: Not on file  Social Connections: Not on file     Family History:   significant for early onset coronary disease  ROS:   Please see the history of present illness.    ROS All other systems reviewed and are negative.   PHYSICAL EXAM:   VS:  BP 130/72    Pulse 76    Ht 6' (1.829 m)    Wt 220 lb (99.8 kg)    SpO2 98%    BMI 29.84 kg/m       General: Alert, oriented x3, no distress, mildly obese Head: no evidence of trauma, PERRL, EOMI, no exophtalmos or lid lag, no myxedema, no xanthelasma; normal ears, nose and oropharynx Neck: normal jugular venous pulsations and no hepatojugular reflux; brisk carotid pulses without delay and no carotid bruits Chest: clear to auscultation, no signs of consolidation by percussion or palpation, normal fremitus, symmetrical and full respiratory excursions Cardiovascular: normal position and quality of the apical impulse, regular rhythm, normal first and second heart sounds, no murmurs, rubs or gallops Abdomen: no tenderness or distention, no masses by palpation, no abnormal pulsatility or arterial bruits, normal bowel sounds, no hepatosplenomegaly Extremities: no clubbing, cyanosis or edema; 2+ radial, ulnar and brachial pulses bilaterally; 2+ right femoral, posterior tibial and dorsalis pedis pulses; 2+ left femoral, posterior tibial and dorsalis pedis pulses; no subclavian or femoral bruits Neurological: grossly nonfocal Psych: Normal mood and affect    Wt Readings from Last 3 Encounters:  07/18/21 220 lb (99.8 kg)  07/09/21 233 lb 3.2 oz (105.8 kg)  06/07/21 235 lb (106.6 kg)      Studies/Labs Reviewed:   Cardiac catheterization 05/01/2021; Origin to Prox Graft lesion is 25% stenosed.   RPDA lesion is 80% stenosed.  SVG to PDA is patent.   Mid LAD lesion is 90% stenosed at large diagonal.  LIMA to LAD is patent.  SVG to diagonal is patent.   Ost LM to Mid LM lesion is 95% stenosed.   Ost Cx to Prox Cx lesion is 80% stenosed.   Prox RCA lesion is 50% stenosed.   RPAV lesion is 70% stenosed. Relatively small territory supplied.   2nd Mrg lesion is  100% stenosed.  SVG to OM2 Origin to Prox Graft lesion is 75% stenosed.   A drug-eluting stent was successfully placed in the SVG using a STENT ONYX FRONTIER 3.5X15, postdilated to 4.0 mm proximally.   Post intervention, there is a 0%  residual stenosis.   There is mild left ventricular systolic dysfunction.   LV end diastolic pressure is mildly elevated.   The left ventricular ejection fraction is 35-45% by visual estimate.   There is no aortic valve stenosis.   Successful PCI of the SVG to OM.  This OM, on previous cath films, connected to the remainder of the circumflex and filled it retrograde.  Now the ostium of the OM is occluded and there is no retrograde filling of the circumflex.  His left main disease has progressed as well.   Plan for PCI of the left main and proximal circumflex.  This will likely require atherectomy or some other calcium modifying technique.  Could use right radial approach as we would not need to engage any bypass grafts.   Restart Eliquis tomorrow.  The patient was loaded with clopidogrel today.  To minimize bleeding risk, will hold off on giving him aspirin at this time.   Plan for same-day discharge if there are no bleeding issues.   Diagnostic Dominance: Right Intervention  Implants     Permanent Stent  Stent Onyx Frontier 3.5x15      Cardiac catheterization 05/16/2021 :     Ost LM to Mid LM lesion is 95% stenosed.   Prox Cx lesion is 80% stenosed.   A drug-eluting stent was successfully placed using a STENT ONYX FRONTIER 3.5X26, postdilated to greater than 4 mm and optimized with intravascular ultrasound.  This one stent covered both the left main and the circumflex lesions.   Post intervention, there is a 0% residual stenosis.   Ost LAD to Prox LAD lesion is 90% stenosed.  Known patent LIMA to LAD.  Flow remained in the LAD after the stent was placed across the ostium.   A drug-eluting stent was successfully placed using a STENT ONYX FRONTIER 3.5X26.   Post intervention, there is a 0% residual stenosis.   LV end diastolic pressure is mildly elevated.   There is no aortic valve stenosis.   Restart Eliquis tomorrow.  Continue clopidogrel monotherapy.  No aspirin to decrease  bleeding risk.  Given his extensive CAD, he may require ongoing antiplatelet therapy in the setting of Eliquis.  Diagnostic Dominance: Right Intervention  Implants     Permanent Stent  Stent Onyx Frontier 3.5x26      TEE 05/29/2021:    1. Left ventricular ejection fraction, by estimation, is 55 to 60%. The  left ventricle has normal function. There is mild left ventricular  hypertrophy.   2. Right ventricular systolic function is mildly reduced. The right  ventricular size is normal.   3. Left atrial size was mildly dilated. No left atrial/left atrial  appendage thrombus was detected.   4. Right atrial size was mildly dilated.   5. The mitral valve is grossly normal. No evidence of mitral valve  regurgitation.   6. The aortic valve is tricuspid. Aortic valve regurgitation is not  visualized.   7. Agitated saline contrast bubble study was negative, with no evidence  of any interatrial shunt.   Conclusion(s)/Recommendation(s): No LA/LAA thrombus identified. Successful  cardioversion performed with restoration of normal sinus rhythm.   EKG:  EKG is ordered today and shows normal sinus rhythm, normal tracing.  The QTC is 391 ms  Recent Labs: 03/27/2021: TSH 0.469 05/29/2021: Hemoglobin 14.6; Platelets 210 07/09/2021: BUN 24; Creatinine, Ser 1.04; Magnesium 2.0; Potassium 4.5; Sodium 137   Lipid Panel    Component Value Date/Time   CHOL 91 09/24/2016 1345   TRIG 97 09/24/2016 1345   HDL 23 (L) 09/24/2016 1345   CHOLHDL 4.0 09/24/2016 1345   VLDL 19 09/24/2016 1345   LDLCALC 49 09/24/2016 1345     ASSESSMENT:    1. Paroxysmal atrial fibrillation (HCC)   2. Chronic diastolic heart failure ([place])   3. Coronary artery disease of native artery of native heart with stable angina pectoris ([place])   4. Dyslipidemia (high LDL; low HDL)   5. Non-insulin dependent type 2 diabetes mellitus ([place])   6. Essential hypertension   7. Acquired thrombophilia ([place])   8. Encounter for  monitoring dofetilide therapy      PLAN:  In order of problems listed above:  AFib: s/p RFA in March 2019 with good clinical response.  Recurrent atrial fibrillation in 2022 in the setting of depressed LV systolic function due to new coronary lesions.  Now back on dofetilide.  In sinus rhythm today.  Asymptomatic on 2 antianginal medications.  On anticoagulation with Eliquis which causes fewer intestinal problems then Xarelto did.  CHA2DS2-VASc 4 (hypertension, diabetes, CAD, history of heart failure). CHF: Recently decreased left ventricular systolic function, now recovered.  NYHA functional class I-II, clinically euvolemic on a low-dose of loop diuretic.  Takes an extra dose of loop diuretic if he eats too much salt and develops dyspnea.  This happens about once or twice a month.  His current "dry weight" appears to be 230-232 pounds on his home scale (I think the weights measured in office today must of been erroneous)..  He has mild residual dyspnea but also has a history of asbestos exposure and chronic lung problems related to heavy smoking for 30 years.  On dapagliflozin. CAD: He did not have angina pectoris with his most recent problems, but developed depressed LVEF and worsening dyspnea.  Underwent extensive revascularization of the SVG to OM, left main, left circumflex arteries, all with drug-eluting stents and will remain on clopidogrel at least until November 2023.  He is not on aspirin due to full anticoagulation., roughly 13 years since his bypass surgery.  On Bystolic, which she tolerates better than conventional beta-blockers. HLP: Labs planned next week with Dr. Karie Kirks.  Target LDL less than 70.  He is on high-dose atorvastatin. DM: Has lost a little bit of weight.  Glycemic control to be reassessed at visit with Dr. Karie Kirks next week.  Target hemoglobin A1c is 7% or less.  Very happy that he is taking dapagliflozin so this is helping with his heart failure as well. HTN:  Well-controlled.  On beta-blocker, ARB, long-acting nitrates. Anticoagulation: Well-tolerated without bleeding complications, but he has bruising more on the combination of clopidogrel and Eliquis. Dofetilide: This has worked well for him in the past for atrial fibrillation and seems to be working now as well.  QTc in normal range.  Electrolytes and renal function recently normal.  Reviewed the risks of drug interactions with this medication has a follow-up visit in April in the [facility] clinic and I will see him 6 months after that.    Medication Adjustments/Labs and Tests Ordered: Current medicines are reviewed at length with the patient today.  Concerns regarding medicines are outlined above.  Medication changes, Labs and Tests ordered today are listed  in the Patient Instructions below. Patient Instructions  Medication Instructions:  No changes *If you need a refill on your cardiac medications before your next appointment, please call your pharmacy*   Lab Work: None ordered If you have labs (blood work) drawn today and your tests are completely normal, you will receive your results only by: [place] (if you have MyChart) OR A paper copy in the mail If you have any lab test that is abnormal or we need to change your treatment, we will call you to review the results.   Testing/Procedures: None ordered   Follow-Up: At [facility], you and your health needs are our priority.  As part of our continuing mission to provide you with exceptional heart care, we have created designated Provider Care Teams.  These Care Teams include your primary Cardiologist (physician) and Advanced Practice Providers (APPs -  Physician Assistants and Nurse Practitioners) who all work together to provide you with the care you need, when you need it.  We recommend signing up for the patient portal called "MyChart".  Sign up information is provided on this After Visit Summary.  MyChart is used to connect  with patients for Virtual Visits (Telemedicine).  Patients are able to view lab/test results, encounter notes, upcoming appointments, etc.  Non-urgent messages can be sent to your provider as well.   To learn more about what you can do with MyChart, go to [email or web address].    Your next appointment:   7 month(s)  The format for your next appointment:   In Person  Provider:   Sanda Klein, MD       Signed, Sanda Klein, MD  07/19/2021 12:12 PM    [address], [place], [place]  [address] Phone: [phone]; Fax: [phone]

## 2021-08-13 DIAGNOSIS — I480 Paroxysmal atrial fibrillation: Secondary | ICD-10-CM | POA: Diagnosis not present

## 2021-08-13 DIAGNOSIS — E1165 Type 2 diabetes mellitus with hyperglycemia: Secondary | ICD-10-CM | POA: Diagnosis not present

## 2021-08-13 DIAGNOSIS — I2510 Atherosclerotic heart disease of native coronary artery without angina pectoris: Secondary | ICD-10-CM | POA: Diagnosis not present

## 2021-08-13 DIAGNOSIS — I10 Essential (primary) hypertension: Secondary | ICD-10-CM | POA: Diagnosis not present

## 2021-08-21 DIAGNOSIS — E1142 Type 2 diabetes mellitus with diabetic polyneuropathy: Secondary | ICD-10-CM | POA: Diagnosis not present

## 2021-08-21 DIAGNOSIS — E039 Hypothyroidism, unspecified: Secondary | ICD-10-CM | POA: Diagnosis not present

## 2021-08-21 DIAGNOSIS — Z79891 Long term (current) use of opiate analgesic: Secondary | ICD-10-CM | POA: Diagnosis not present

## 2021-08-21 DIAGNOSIS — E1165 Type 2 diabetes mellitus with hyperglycemia: Secondary | ICD-10-CM | POA: Diagnosis not present

## 2021-08-27 DIAGNOSIS — L820 Inflamed seborrheic keratosis: Secondary | ICD-10-CM | POA: Diagnosis not present

## 2021-10-08 ENCOUNTER — Ambulatory Visit (HOSPITAL_COMMUNITY): Payer: Federal, State, Local not specified - PPO | Admitting: Physician Assistant

## 2021-10-15 ENCOUNTER — Other Ambulatory Visit: Payer: Self-pay | Admitting: Cardiovascular Disease

## 2021-10-15 DIAGNOSIS — I4819 Other persistent atrial fibrillation: Secondary | ICD-10-CM

## 2021-10-16 NOTE — Telephone Encounter (Signed)
Prescription refill request for Eliquis received. ?Indication: Afib  ?Last office visit:07/18/21 ([place]) ?Scr: 1.04 (07/09/21) ?Age: 62 ?Weight: 99.8kg ? ?Appropriate dose and refill sent to requested pharmacy.  ?

## 2021-10-31 ENCOUNTER — Other Ambulatory Visit: Payer: Self-pay | Admitting: Student

## 2021-11-16 DIAGNOSIS — J209 Acute bronchitis, unspecified: Secondary | ICD-10-CM | POA: Diagnosis not present

## 2021-11-16 DIAGNOSIS — J069 Acute upper respiratory infection, unspecified: Secondary | ICD-10-CM | POA: Diagnosis not present

## 2021-12-03 DIAGNOSIS — Z7901 Long term (current) use of anticoagulants: Secondary | ICD-10-CM | POA: Diagnosis not present

## 2021-12-03 DIAGNOSIS — I480 Paroxysmal atrial fibrillation: Secondary | ICD-10-CM | POA: Diagnosis not present

## 2021-12-03 DIAGNOSIS — I10 Essential (primary) hypertension: Secondary | ICD-10-CM | POA: Diagnosis not present

## 2021-12-03 DIAGNOSIS — I509 Heart failure, unspecified: Secondary | ICD-10-CM | POA: Diagnosis not present

## 2021-12-18 ENCOUNTER — Other Ambulatory Visit: Payer: Self-pay | Admitting: Student

## 2022-02-25 DIAGNOSIS — Z125 Encounter for screening for malignant neoplasm of prostate: Secondary | ICD-10-CM | POA: Diagnosis not present

## 2022-02-25 DIAGNOSIS — E7800 Pure hypercholesterolemia, unspecified: Secondary | ICD-10-CM | POA: Diagnosis not present

## 2022-02-25 DIAGNOSIS — E1165 Type 2 diabetes mellitus with hyperglycemia: Secondary | ICD-10-CM | POA: Diagnosis not present

## 2022-02-25 DIAGNOSIS — E039 Hypothyroidism, unspecified: Secondary | ICD-10-CM | POA: Diagnosis not present

## 2022-02-25 DIAGNOSIS — E1142 Type 2 diabetes mellitus with diabetic polyneuropathy: Secondary | ICD-10-CM | POA: Diagnosis not present

## 2022-03-24 NOTE — Progress Notes (Signed)
Cardiology Clinic Note   Patient Name: [name] Date of Encounter: 03/25/2022  Primary Care Provider:  Lemmie Evens, MD Primary Cardiologist:  Sanda Klein, MD  Patient Profile    [name] 61 year old male presents the clinic today for follow-up evaluation of his persistent atrial fibrillation and essential hypertension.  Past Medical History    Past Medical History:  Diagnosis Date   Abdominal distention    Anemia    Postoperative   Chronic diastolic CHF (congestive heart failure) (HCC)    Coronary artery disease    a. premature - acute MI age 83 s/p BMS to prox LAD. b. s/p DES to RCA, PLA, mLAD in 2007. c. 4V CABG in 2008 with LIMA to LAD, SVG to first diagonal, SVG to first OM, SVG to PDA   Diabetes ([place])    Edema    H/O gastroesophageal reflux (GERD)    High cholesterol    Hypertension    Myocardial infarction ([place])    Obesity    Persistent atrial fibrillation ([place])    a. on Tikosyn, Xarelto.   Pleural effusion    resolved with therapy   Past Surgical History:  Procedure Laterality Date   ABLATION OF DYSRHYTHMIC FOCUS  09/23/2017   ATRIAL FIBRILLATION ABLATION N/A 09/23/2017   Procedure: ATRIAL FIBRILLATION ABLATION;  Surgeon: Thompson Grayer, MD;  Location: [place] [facility];  Service: Cardiovascular;  Laterality: N/A;   BUBBLE STUDY  05/29/2021   Procedure: BUBBLE STUDY;  Surgeon: Pixie Casino, MD;  Location: [facility] ENDOSCOPY;  Service: Cardiovascular;;   CARDIAC CATHETERIZATION  2008   CARDIOVERSION N/A 05/29/2021   Procedure: CARDIOVERSION;  Surgeon: Pixie Casino, MD;  Location: [facility] ENDOSCOPY;  Service: Cardiovascular;  Laterality: N/A;   [place] GRAFT  2008   LIMA-LAD, SVG-Dx, SVG-OM1, SVG-PDA   CORONARY ATHERECTOMY N/A 05/16/2021   Procedure: CORONARY ATHERECTOMY;  Surgeon: Jettie Booze, MD;  Location: [place] [facility];  Service: Cardiovascular;  Laterality: N/A;   CORONARY STENT  INTERVENTION N/A 05/01/2021   Procedure: CORONARY STENT INTERVENTION;  Surgeon: Jettie Booze, MD;  Location: [place] [facility];  Service: Cardiovascular;  Laterality: N/A;   CORONARY STENT INTERVENTION N/A 05/16/2021   Procedure: CORONARY STENT INTERVENTION;  Surgeon: Jettie Booze, MD;  Location: [place] [facility];  Service: Cardiovascular;  Laterality: N/A;   INTRAVASCULAR ULTRASOUND/IVUS N/A 05/16/2021   Procedure: Intravascular Ultrasound/IVUS;  Surgeon: Jettie Booze, MD;  Location: [place] [facility];  Service: Cardiovascular;  Laterality: N/A;   LEFT HEART CATH N/A 05/16/2021   Procedure: Left Heart Cath;  Surgeon: Jettie Booze, MD;  Location: [place] [facility];  Service: Cardiovascular;  Laterality: N/A;   LEFT HEART CATH AND CORS/GRAFTS ANGIOGRAPHY N/A 09/25/2016   Procedure: Left Heart Cath and Cors/Grafts Angiography;  Surgeon: Jettie Booze, MD;  Location: [place] [facility];  Service: Cardiovascular;  Laterality: N/A;   LEFT HEART CATH AND CORS/GRAFTS ANGIOGRAPHY N/A 05/01/2021   Procedure: LEFT HEART CATH AND CORS/GRAFTS ANGIOGRAPHY;  Surgeon: Jettie Booze, MD;  Location: [place] [facility];  Service: Cardiovascular;  Laterality: N/A;   TEE WITHOUT CARDIOVERSION N/A 05/29/2021   Procedure: TRANSESOPHAGEAL ECHOCARDIOGRAM (TEE);  Surgeon: Pixie Casino, MD;  Location: [facility] ENDOSCOPY;  Service: Cardiovascular;  Laterality: N/A;    Allergies  No Known Allergies  History of Present Illness    [name] has a PMH of persistent atrial  fibrillation, HTN, chronic combined systolic and diastolic CHF, coronary artery disease status post CABG x4 in 2008 (underwent subsequent cardiac catheterization 3/18 which showed patent L-LAD, S-diagonal, S-OM 1, S-PDA), GERD, type 2 diabetes, HLD, and obesity.  He was found to have reduced ejection fraction during his repeat ablation for atrial fibrillation.  EF was noted to be 30-35%.  He was found to  have new coronary stenosis.  He underwent cardiac catheterization on 05/01/2021 and received stenting to his SVG-OM.  This did not improve his symptoms.  He underwent rotational arthrectomy and DES to his left coronary artery and proximal left circumflex by Dr.Varanasi.  After his second revascularization he felt substantially improved.  After his revascularization he started dofetilide treatment and underwent TEE guided cardioversion 05/29/2021.  His ejection fraction improved to 55-60%.  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  His NYHA was a class II.  His weight remains stable 230-222 LBS.  He continues to take apixaban twice daily and clopidogrel 75 mg daily.  He presents to the clinic today for follow-up evaluation and states he is a little bit short of breath today.  He reports some dietary indiscretion over the weekend and a 2 pound weight increase.  He reports that when he has weight increase he will take an extra half dose of his furosemide which works well.  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  He reports compliance with his medications and had lab work recently drawn at his PCP.  I will request labs from his PCP, give salty 6 diet information, plan follow-up for 8 to 9 months.  At this time we will continue his current medication regimen.  At this time he denies chest pain, shortness of breath, lower extremity edema, fatigue, palpitations, melena, hematuria, hemoptysis, diaphoresis, weakness, presyncope, syncope, orthopnea, and PND.     Home Medications    Prior to Admission medications   Medication Sig Start Date End Date Taking? Authorizing Provider  atorvastatin (LIPITOR) 80 MG tablet Take 1 tablet (80 mg total) by mouth at bedtime. 05/16/21   Duke, Roe Rutherford, PA  Cholecalciferol (VITAMIN D-3 PO) Take 1 tablet by mouth in the morning.    [provider]  clopidogrel (PLAVIX) 75 MG tablet TAKE ONE TABLET (75 MG TOTAL) BY MOUTH DAILY WITH BREAKFAST. 10/31/21   Marjie Skiff E, [name]  dapagliflozin propanediol (FARXIGA) 10 MG TABS tablet Take 10 mg by mouth in the morning.    [provider]  dofetilide (TIKOSYN) 500 MCG capsule TAKE 1 CAPSULE (500 MCG TOTAL) BY MOUTH TWO TIMES DAILY. 12/19/21   Allred, Fayrene Fearing, MD  ELIQUIS 5 MG TABS tablet TAKE ONE TABLET BY MOUTH TWICE A DAY 10/16/21   Croitoru, Mihai, MD  ferrous sulfate 325 (65 FE) MG tablet Take 325 mg by mouth daily.    [provider]  furosemide (LASIX) 40 MG tablet Take 40 mg by mouth daily. 03/26/14   [provider]  glimepiride (AMARYL) 4 MG tablet Take 4 mg by mouth daily with breakfast.    [provider]  isosorbide mononitrate (IMDUR) 30 MG 24 hr tablet Take 30 mg by mouth in the morning.    [provider]  JANUVIA 100 MG tablet Take 100 mg by mouth in the morning. 02/05/14   [provider]  losartan (COZAAR) 50 MG tablet Take 50 mg by mouth  every evening.    [provider]  metFORMIN (GLUCOPHAGE) 1000 MG tablet Take 1 tablet (1,000 mg total) by mouth 2 (two) times daily with a meal. Resume on 05/19/21 05/16/21   Marcelino Duster, PA  Multiple Vitamins-Minerals (MULTIVITAMIN WITH MINERALS) tablet Take 1 tablet by mouth 2 (two) times daily.     [provider]  Nebivolol HCl (BYSTOLIC) 20 MG TABS Take 1 tablet (20 mg total) by mouth every morning. 05/16/21   Duke, Roe Rutherford, PA  nitroGLYCERIN (NITROSTAT) 0.4 MG SL tablet Place 1 tablet (0.4 mg total) under the tongue every 5 (five) minutes as needed for chest pain. 09/26/16   Leone Brand, NP  potassium chloride SA (KLOR-CON M) 20 MEQ tablet Take 2 tablets (40 mEq total) by mouth daily. 06/01/21   Graciella Freer, [name]    Family History    Family History  Problem Relation Age of Onset   CAD Father    Vascular Disease Father         carotid artery disease   He indicated that his mother is deceased. He indicated that his father is deceased. He indicated that his sister is alive. He indicated that his brother is deceased. He indicated that his maternal grandmother is deceased. He indicated that his maternal grandfather is deceased. He indicated that his paternal grandmother is deceased. He indicated that his paternal grandfather is deceased.  Social History    Social History   Socioeconomic History   Marital status: Married    Spouse name: Not on file   Number of children: Not on file   Years of education: Not on file   Highest education level: Not on file  Occupational History   Occupation: Sports administrator    Employer: [employer]   Occupation: Has cattle and Programme researcher, broadcasting/film/video.  Tobacco Use   Smoking status: Former    Packs/day: 2.00    Years: 30.00    Total pack years: 60.00    Types: Cigarettes   Smokeless tobacco: Never   Tobacco comments:    Former smoker 05/29/2021  Vaping Use   Vaping Use: Never used  Substance and Sexual Activity   Alcohol use: Yes    Alcohol/week: 1.0 standard drink of alcohol    Types: 1 Standard drinks or equivalent per week    Comment: rare   Drug use: No   Sexual activity: Not on file  Other Topics Concern   Not on file  Social History Narrative   Not on file   Social Determinants of Health   Financial Resource Strain: Not on file  Food Insecurity: Not on file  Transportation Needs: Not on file  Physical Activity: Not on file  Stress: Not on file  Social Connections: Not on file  Intimate Partner Violence: Not on file     Review of Systems    General:  No chills, fever, night sweats or weight changes.  Cardiovascular:  No chest pain, dyspnea on exertion, edema, orthopnea, palpitations, paroxysmal nocturnal dyspnea. Dermatological: No rash, lesions/masses Respiratory: No cough, dyspnea Urologic: No hematuria, dysuria Abdominal:   No nausea, vomiting, diarrhea,  bright red blood per rectum, melena, or hematemesis Neurologic:  No visual changes, wkns, changes in mental status. All other systems reviewed and are otherwise negative except as noted above.  Physical Exam    VS:  BP 122/80 (BP Location: Left Arm, Patient Position: Sitting, Cuff Size: Normal)   Pulse 71   Ht 6' (1.829 m)   Wt 232  lb (105.2 kg)   BMI 31.46 kg/m  , BMI Body mass index is 31.46 kg/m. GEN: Well nourished, well developed, in no acute distress. HEENT: normal. Neck: Supple, no JVD, carotid bruits, or masses. Cardiac: RRR, no murmurs, rubs, or gallops. No clubbing, cyanosis, edema.  Radials/DP/PT 2+ and equal bilaterally.  Respiratory:  Respirations regular and unlabored, clear to auscultation bilaterally. GI: Soft, nontender, nondistended, BS + x 4. MS: no deformity or atrophy. Skin: warm and dry, no rash. Neuro:  Strength and sensation are intact. Psych: Normal affect.  Accessory Clinical Findings    Recent Labs: 03/27/2021: TSH 0.469 05/29/2021: Hemoglobin 14.6; Platelets 210 07/09/2021: BUN 24; Creatinine, Ser 1.04; Magnesium 2.0; Potassium 4.5; Sodium 137   Recent Lipid Panel    Component Value Date/Time   CHOL 91 09/24/2016 1345   TRIG 97 09/24/2016 1345   HDL 23 (L) 09/24/2016 1345   CHOLHDL 4.0 09/24/2016 1345   VLDL 19 09/24/2016 1345   LDLCALC 49 09/24/2016 1345         ECG personally reviewed by me today-normal sinus rhythm rightward axis deviation 71 bpm- No acute changes  Echocardiogram 05/29/2021  IMPRESSIONS     1. Left ventricular ejection fraction, by estimation, is 55 to 60%. The  left ventricle has normal function. There is mild left ventricular  hypertrophy.   2. Right ventricular systolic function is mildly reduced. The right  ventricular size is normal.   3. Left atrial size was mildly dilated. No left atrial/left atrial  appendage thrombus was detected.   4. Right atrial size was mildly dilated.   5. The mitral valve is  grossly normal. No evidence of mitral valve  regurgitation.   6. The aortic valve is tricuspid. Aortic valve regurgitation is not  visualized.   7. Agitated saline contrast bubble study was negative, with no evidence  of any interatrial shunt.   Conclusion(s)/Recommendation(s): No LA/LAA thrombus identified. Successful  cardioversion performed with restoration of normal sinus rhythm.   FINDINGS  Assessment & Plan   1.  Atrial fibrillation-status post radiofrequency ablation 3/19.  Denies episodes of accelerated or irregular heartbeat.  Heart rate today 71 bpm.  CHA2DS2-VASc score 4.  Reports compliance with Eliquis and denies bleeding issues. Continue Eliquis, dofetilide, Bystolic Heart healthy low-sodium diet-salty 6 given Increase physical activity as tolerated  Chronic diastolic CHF-no increased DOE or activity intolerance.  NYHA class I-2.  Euvolemic.  Weight stable. Continue losartan, Bystolic, Farxiga Heart healthy low-sodium diet-salty 6 given Increase physical activity as tolerated  Coronary artery disease-no recent episodes of arm neck back or chest discomfort.  Maintains activity tolerance.  Denies dyspnea.  Status post CABG and post CABG arthrectomy with DES placement. Continue Plavix, Imdur, losartan  Hyperlipidemia-compliant with atorvastatin. Continue atorvastatin Heart healthy low-sodium high-fiber diet Increase physical activity as tolerated Always with PCP Essential hypertension-BP today 122/80 Continue losartan, Bystolic Heart healthy low-sodium diet-salty 6 given Increase physical activity as tolerated  Disposition: Follow-up with Dr. Royann Shivers in 6-9 months.   Thomasene Ripple. Kenslei Hearty NP-C     03/25/2022, 10:27 AM [facility] [address] Office [phone] Fax [phone]  Notice: This dictation was prepared with Dragon dictation along with smaller phrase technology. Any transcriptional errors that result from this  process are unintentional and may not be corrected upon review.  I spent 14 minutes examining this patient, reviewing medications, and using patient centered shared decision making involving her cardiac care.  Prior to her visit I spent greater  than 20 minutes reviewing her past medical history,  medications, and prior cardiac tests.

## 2022-03-25 ENCOUNTER — Ambulatory Visit: Payer: Federal, State, Local not specified - PPO | Attending: General Practice | Admitting: General Practice

## 2022-03-25 ENCOUNTER — Encounter: Payer: Self-pay | Admitting: General Practice

## 2022-03-25 VITALS — BP 122/80 | HR 71 | Ht 72.0 in | Wt 232.0 lb

## 2022-03-25 DIAGNOSIS — I480 Paroxysmal atrial fibrillation: Secondary | ICD-10-CM | POA: Diagnosis not present

## 2022-03-25 DIAGNOSIS — I25118 Atherosclerotic heart disease of native coronary artery with other forms of angina pectoris: Secondary | ICD-10-CM

## 2022-03-25 DIAGNOSIS — I10 Essential (primary) hypertension: Secondary | ICD-10-CM

## 2022-03-25 DIAGNOSIS — I5030 Unspecified diastolic (congestive) heart failure: Secondary | ICD-10-CM | POA: Diagnosis not present

## 2022-03-25 DIAGNOSIS — E785 Hyperlipidemia, unspecified: Secondary | ICD-10-CM

## 2022-03-25 DIAGNOSIS — I5032 Chronic diastolic (congestive) heart failure: Secondary | ICD-10-CM

## 2022-03-25 NOTE — Patient Instructions (Signed)
Medication Instructions:  The current medical regimen is effective;  continue present plan and medications as directed. Please refer to the Current Medication list given to you today.  *If you need a refill on your cardiac medications before your next appointment, please call your pharmacy*   Lab Work: NONE  If you have any lab test that is abnormal or we need to change your treatment, we will call you to review the results.  Testing/Procedures: NONE   Follow-Up: At [facility], you and your health needs are our priority.  As part of our continuing mission to provide you with exceptional heart care, we have created designated Provider Care Teams.  These Care Teams include your primary Cardiologist (physician) and Advanced Practice Providers (APPs -  Physician Assistants and Nurse Practitioners) who all work together to provide you with the care you need, when you need it.  We recommend signing up for the patient portal called "MyChart".  Sign up information is provided on this After Visit Summary.  MyChart is used to connect with patients for Virtual Visits (Telemedicine).  Patients are able to view lab/test results, encounter notes, upcoming appointments, etc.  Non-urgent messages can be sent to your provider as well.   To learn more about what you can do with MyChart, go to [email or web address].    Your next appointment:   8-9 month(s)  The format for your next appointment:   In Person  Provider:   Sanda Klein, MD    Other Instructions PLEASE READ AND FOLLOW ATTACHED  SALTY 6  PLEASE INCREASE PHYSICAL ACTIVITY AS TOLERATED  Important Information About Sugar

## 2022-04-08 DIAGNOSIS — Z23 Encounter for immunization: Secondary | ICD-10-CM | POA: Diagnosis not present

## 2022-04-08 DIAGNOSIS — Z7901 Long term (current) use of anticoagulants: Secondary | ICD-10-CM | POA: Diagnosis not present

## 2022-04-08 DIAGNOSIS — E1165 Type 2 diabetes mellitus with hyperglycemia: Secondary | ICD-10-CM | POA: Diagnosis not present

## 2022-04-08 DIAGNOSIS — E6609 Other obesity due to excess calories: Secondary | ICD-10-CM | POA: Diagnosis not present

## 2022-04-08 DIAGNOSIS — I4821 Permanent atrial fibrillation: Secondary | ICD-10-CM | POA: Diagnosis not present

## 2022-04-17 ENCOUNTER — Other Ambulatory Visit: Payer: Self-pay | Admitting: Cardiovascular Disease

## 2022-04-17 ENCOUNTER — Other Ambulatory Visit: Payer: Self-pay | Admitting: Student

## 2022-04-17 DIAGNOSIS — I4819 Other persistent atrial fibrillation: Secondary | ICD-10-CM

## 2022-04-17 NOTE — Telephone Encounter (Signed)
Prescription refill request for Eliquis received. Indication: PAF Last office visit: 03/25/22  Thomasene Mohair NP Scr: 1.04 on 07/09/21   Age: 61 Weight: 105.2kg  Based on above findings Eliquis 5mg  twice daily is the appropriate dose.  Refill approved.

## 2022-05-02 ENCOUNTER — Other Ambulatory Visit: Payer: Self-pay | Admitting: Student

## 2022-05-17 ENCOUNTER — Other Ambulatory Visit: Payer: Self-pay | Admitting: Internal Medicine

## 2022-05-30 ENCOUNTER — Other Ambulatory Visit: Payer: Self-pay | Admitting: Cardiovascular Disease

## 2022-08-05 DIAGNOSIS — I4821 Permanent atrial fibrillation: Secondary | ICD-10-CM | POA: Diagnosis not present

## 2022-08-05 DIAGNOSIS — J209 Acute bronchitis, unspecified: Secondary | ICD-10-CM | POA: Diagnosis not present

## 2022-08-05 DIAGNOSIS — E1159 Type 2 diabetes mellitus with other circulatory complications: Secondary | ICD-10-CM | POA: Diagnosis not present

## 2022-08-05 DIAGNOSIS — I10 Essential (primary) hypertension: Secondary | ICD-10-CM | POA: Diagnosis not present

## 2022-08-08 ENCOUNTER — Encounter (HOSPITAL_COMMUNITY): Payer: Self-pay | Admitting: *Deleted

## 2022-08-09 ENCOUNTER — Other Ambulatory Visit: Payer: Self-pay | Admitting: Cardiovascular Disease

## 2022-08-09 DIAGNOSIS — I4819 Other persistent atrial fibrillation: Secondary | ICD-10-CM

## 2022-08-09 NOTE — Telephone Encounter (Signed)
Prescription refill request for Eliquis received. Indication: afib  Last office visit: [place], 03/25/2022 Scr: 1.04, 07/09/2021 Age: 63 Weight: 105.2 kg

## 2022-08-12 MED ORDER — APIXABAN 5 MG PO TABS: 5.0000 mg | ORAL_TABLET | Freq: Two times a day (BID) | ORAL | 5 refills | Status: DC

## 2022-08-12 NOTE — Telephone Encounter (Signed)
Received labs from PCP. Placed in pt's chart under media tab.   Scr 0.97 on 02/25/22.   Appropriate dose. Refill sent.

## 2022-08-12 NOTE — Addendum Note (Signed)
Addended by: Leonidas Romberg on: 08/12/2022 01:27 PM   Modules accepted: Orders

## 2022-08-12 NOTE — Telephone Encounter (Signed)
Called pt's PCP who stated that pt had labs done  (including CMP) in August 2023 and will fax over a copy of blood work.   Pt is overdue for blood work. Originally sent in for 3 refills. However, called pharmacy and informed them to only refill a 1 month supply of Eliquis because we are awaiting blood work from pt's PCP. Once blood work arrives we can send in for more refills.

## 2022-10-22 ENCOUNTER — Other Ambulatory Visit: Payer: Self-pay

## 2022-10-22 MED ORDER — DOFETILIDE 500 MCG PO CAPS: ORAL_CAPSULE | ORAL | 0 refills | Status: DC

## 2022-10-22 NOTE — Telephone Encounter (Signed)
This a A-Fib clinic pt

## 2022-10-23 DIAGNOSIS — E162 Hypoglycemia, unspecified: Secondary | ICD-10-CM | POA: Diagnosis not present

## 2022-10-23 DIAGNOSIS — E1159 Type 2 diabetes mellitus with other circulatory complications: Secondary | ICD-10-CM | POA: Diagnosis not present

## 2022-10-23 DIAGNOSIS — I10 Essential (primary) hypertension: Secondary | ICD-10-CM | POA: Diagnosis not present

## 2022-10-23 DIAGNOSIS — Z79891 Long term (current) use of opiate analgesic: Secondary | ICD-10-CM | POA: Diagnosis not present

## 2022-11-01 ENCOUNTER — Other Ambulatory Visit: Payer: Self-pay | Admitting: Student

## 2022-11-26 ENCOUNTER — Other Ambulatory Visit: Payer: Self-pay | Admitting: Physician Assistant

## 2022-12-02 DIAGNOSIS — E782 Mixed hyperlipidemia: Secondary | ICD-10-CM | POA: Diagnosis not present

## 2022-12-02 DIAGNOSIS — E1142 Type 2 diabetes mellitus with diabetic polyneuropathy: Secondary | ICD-10-CM | POA: Diagnosis not present

## 2022-12-02 DIAGNOSIS — I509 Heart failure, unspecified: Secondary | ICD-10-CM | POA: Diagnosis not present

## 2022-12-02 DIAGNOSIS — I10 Essential (primary) hypertension: Secondary | ICD-10-CM | POA: Diagnosis not present

## 2023-02-05 ENCOUNTER — Ambulatory Visit (HOSPITAL_COMMUNITY): Payer: Federal, State, Local not specified - PPO | Admitting: Physician Assistant

## 2023-02-17 ENCOUNTER — Other Ambulatory Visit: Payer: Self-pay | Admitting: Cardiovascular Disease

## 2023-02-24 ENCOUNTER — Ambulatory Visit (HOSPITAL_COMMUNITY): Payer: Federal, State, Local not specified - PPO | Admitting: Physician Assistant

## 2023-03-05 ENCOUNTER — Encounter (HOSPITAL_COMMUNITY): Payer: Self-pay | Admitting: Physician Assistant

## 2023-03-05 ENCOUNTER — Other Ambulatory Visit (HOSPITAL_COMMUNITY): Payer: Self-pay

## 2023-03-05 ENCOUNTER — Ambulatory Visit (HOSPITAL_COMMUNITY)
Admission: RE | Admit: 2023-03-05 | Discharge: 2023-03-05 | Disposition: A | Discharge status: Home / Self Care | Payer: Federal, State, Local not specified - PPO | Source: Ambulatory Visit | Attending: Physician Assistant | Admitting: Physician Assistant

## 2023-03-05 VITALS — BP 130/82 | HR 74 | Ht 72.0 in | Wt 221.4 lb

## 2023-03-05 DIAGNOSIS — I2510 Atherosclerotic heart disease of native coronary artery without angina pectoris: Secondary | ICD-10-CM | POA: Diagnosis not present

## 2023-03-05 DIAGNOSIS — I110 Hypertensive heart disease with heart failure: Secondary | ICD-10-CM | POA: Insufficient documentation

## 2023-03-05 DIAGNOSIS — D6869 Other thrombophilia: Secondary | ICD-10-CM | POA: Insufficient documentation

## 2023-03-05 DIAGNOSIS — I4819 Other persistent atrial fibrillation: Secondary | ICD-10-CM

## 2023-03-05 DIAGNOSIS — I5032 Chronic diastolic (congestive) heart failure: Secondary | ICD-10-CM | POA: Diagnosis not present

## 2023-03-05 DIAGNOSIS — Z7901 Long term (current) use of anticoagulants: Secondary | ICD-10-CM | POA: Diagnosis not present

## 2023-03-05 DIAGNOSIS — Z79899 Other long term (current) drug therapy: Secondary | ICD-10-CM | POA: Diagnosis not present

## 2023-03-05 DIAGNOSIS — Z5181 Encounter for therapeutic drug level monitoring: Secondary | ICD-10-CM | POA: Insufficient documentation

## 2023-03-05 DIAGNOSIS — I5022 Chronic systolic (congestive) heart failure: Secondary | ICD-10-CM | POA: Diagnosis not present

## 2023-03-05 LAB — BASIC METABOLIC PANEL
Anion gap: 10 (ref 5–15)
BUN: 18 mg/dL (ref 8–23)
CO2: 20 mmol/L — ABNORMAL LOW (ref 22–32)
Calcium: 9.5 mg/dL (ref 8.9–10.3)
Chloride: 108 mmol/L (ref 98–111)
Creatinine, Ser: 0.93 mg/dL (ref 0.61–1.24)
GFR, Estimated: >60 mL/min (ref >60)
Glucose, Bld: 145 mg/dL — ABNORMAL HIGH (ref 70–99)
Potassium: 4.5 mmol/L (ref 3.5–5.1)
Sodium: 138 mmol/L (ref 135–145)

## 2023-03-05 LAB — CBC
HCT: 44.3 % (ref 39.0–52.0)
Hemoglobin: 14.2 g/dL (ref 13.0–17.0)
MCH: 29.4 pg (ref 26.0–34.0)
MCHC: 32.1 g/dL (ref 30.0–36.0)
MCV: 91.7 fL (ref 80.0–100.0)
Platelets: 246 10*3/uL (ref 150–400)
RBC: 4.83 MIL/uL (ref 4.22–5.81)
RDW: 13.3 % (ref 11.5–15.5)
WBC: 13.2 10*3/uL — ABNORMAL HIGH (ref 4.0–10.5)
nRBC: 0 % (ref 0.0–0.2)

## 2023-03-05 LAB — MAGNESIUM: Magnesium: 2.1 mg/dL (ref 1.7–2.4)

## 2023-03-05 MED ORDER — APIXABAN 5 MG PO TABS: 5.0000 mg | ORAL_TABLET | Freq: Two times a day (BID) | ORAL | 11 refills | Status: DC

## 2023-03-05 MED ORDER — FUROSEMIDE 40 MG PO TABS: 40.0000 mg | ORAL_TABLET | Freq: Two times a day (BID) | ORAL | 1 refills | Status: DC | PRN

## 2023-03-05 NOTE — Progress Notes (Signed)
Primary Care Physician: Gareth Morgan, MD Primary Cardiologist: Dr Royann Shivers  Primary Electrophysiologist: none Referring Physician: Dr Oletta Darter is a 62 y.o. male with a history of HTN, CAD, chronic systolic CHF, DM, HLD, atrial fibrillation who presents for follow up in the [facility]. Patient is on Eliquis for a CHADS2VASC score of 4. He was seen by Dr Johney Frame 05/07/21 and had been in afib persistently for months. Echo showed new reduced EF at 30%. He also had PCI 05/16/21. Because of holding anticoagulation for LHC, ablation for afib was not pursued. Dofetilide was recommended with TEE DCCV prior. S/p dofetilide loading S/p dofetilide loading 11/29-12/2/22.  On follow up today, patient reports that he has done well since his last visit. He will have tachypalpitations occasionally but these are brief and he is not very symptomatic. Nothing sustained like prior to dofetilide loading. He has been much more physically active and has lost ~40 lbs. No bleeding issues on anticoagulation.   Today, he denies symptoms of chest pain, orthopnea, PND, lower extremity edema, dizziness, presyncope, syncope, snoring, daytime somnolence, bleeding, or neurologic sequela. The patient is tolerating medications without difficulties and is otherwise without complaint today.    Atrial Fibrillation Risk Factors:  he does not have symptoms or diagnosis of sleep apnea. he does not have a history of rheumatic fever. he does not have a history of alcohol use.   Atrial Fibrillation Management history:  Previous antiarrhythmic drugs: dofetilide  Previous cardioversions: 05/29/21 Previous ablations: 2019 Anticoagulation history: Eliquis   Past Medical History:  Diagnosis Date   Abdominal distention    Anemia    Postoperative   Chronic diastolic CHF (congestive heart failure) (HCC)    Coronary artery disease    a. premature - acute MI age 26 s/p BMS to prox LAD.  b. s/p DES to RCA, PLA, mLAD in 2007. c. 4V CABG in 2008 with LIMA to LAD, SVG to first diagonal, SVG to first OM, SVG to PDA   Diabetes (HCC)    Edema    H/O gastroesophageal reflux (GERD)    High cholesterol    Hypertension    Myocardial infarction (HCC)    Obesity    Persistent atrial fibrillation (HCC)    a. on Tikosyn, Xarelto.   Pleural effusion    resolved with therapy    Current Outpatient Medications  Medication Sig Dispense Refill   atorvastatin (LIPITOR) 80 MG tablet TAKE ONE TABLET BY MOUTH EVERY NIGHT AT BEDTIME 90 tablet 3   Cholecalciferol (VITAMIN D-3 PO) Take 1 tablet by mouth in the morning.     clopidogrel (PLAVIX) 75 MG tablet TAKE ONE TABLET (75 MG TOTAL) BY MOUTH DAILY WITH BREAKFAST. 30 tablet 3   dapagliflozin propanediol (FARXIGA) 10 MG TABS tablet Take 10 mg by mouth in the morning.     dofetilide (TIKOSYN) 500 MCG capsule TAKE ONE CAPSULE ( TOTAL) BY MOUTHTWO TIMES DAILY appt req for refill [id number] 60 capsule 0   ferrous sulfate 325 (65 FE) MG tablet Take 325 mg by mouth daily.     glimepiride (AMARYL) 4 MG tablet Take 4 mg by mouth daily with breakfast.     isosorbide mononitrate (IMDUR) 30 MG 24 hr tablet Take 30 mg by mouth in the morning.     JANUVIA 100 MG tablet Take 100 mg by mouth in the morning.     losartan (COZAAR) 50 MG tablet Take 50 mg by mouth every evening.  metFORMIN (GLUCOPHAGE) 1000 MG tablet Take 1 tablet (1,000 mg total) by mouth 2 (two) times daily with a meal. Resume on 05/19/21     Multiple Vitamins-Minerals (MULTIVITAMIN WITH MINERALS) tablet Take 1 tablet by mouth 2 (two) times daily.      Nebivolol HCl 20 MG TABS TAKE 1 TABLET EVERY MORNING 90 tablet 3   nitroGLYCERIN (NITROSTAT) 0.4 MG SL tablet Place 1 tablet (0.4 mg total) under the tongue every 5 (five) minutes as needed for chest pain. 25 tablet 4   potassium chloride SA (KLOR-CON M) 20 MEQ tablet Take 2 tablets (40 mEq total) by mouth daily. 60 tablet 6   apixaban  (ELIQUIS) 5 MG TABS tablet Take 1 tablet (5 mg total) by mouth 2 (two) times daily. 60 tablet 11   furosemide (LASIX) 40 MG tablet Take 1 tablet (40 mg total) by mouth 2 (two) times daily as needed for fluid. 60 tablet 1   No current facility-administered medications for this encounter.    ROS- All systems are reviewed and negative except as per the HPI above.  Physical Exam: Vitals:   03/05/23 0849  BP: 130/82  Pulse: 74  Weight: 100.4 kg  Height: 6' (1.829 m)    GEN: Well nourished, well developed in no acute distress NECK: No JVD; No carotid bruits CARDIAC: Regular rate and rhythm, no murmurs, rubs, gallops RESPIRATORY:  Clear to auscultation without rales, wheezing or rhonchi  ABDOMEN: Soft, non-tender, non-distended EXTREMITIES:  No edema; No deformity    Wt Readings from Last 3 Encounters:  03/05/23 100.4 kg  03/25/22 105.2 kg  07/18/21 99.8 kg    EKG today demonstrates  SR Vent. rate 74 BPM PR interval 168 ms QRS duration 86 ms QT/QTcB 368/408 ms  Echo 04/16/21 demonstrated   1. Left ventricular ejection fraction, by estimation, is 30 to 35%. The  left ventricle has moderately decreased function. The left ventricle  demonstrates global hypokinesis. There is moderate asymmetric left  ventricular hypertrophy of the posterior-lateral segment. Left ventricular diastolic parameters are indeterminate.   2. Right ventricular systolic function is moderately reduced. The right  ventricular size is normal.   3. Left atrial size was mild to moderately dilated.   4. Right atrial size was mildly dilated.   5. The mitral valve is grossly normal. Trivial mitral valve  regurgitation. No evidence of mitral stenosis.   6. The aortic valve is abnormal. There is moderate calcification of the  aortic valve. Aortic valve regurgitation is trivial. Mild to moderate  aortic valve sclerosis/calcification is present, without any evidence of  aortic stenosis.   7. The inferior vena  cava is normal in size with <50% respiratory  variability, suggesting right atrial pressure of 8 mmHg.   Epic records are reviewed at length today  CHA2DS2-VASc Score = 4  The patient's score is based upon: CHF History: 1 HTN History: 1 Diabetes History: 1 Stroke History: 0 Vascular Disease History: 1 Age Score: 0 Gender Score: 0       ASSESSMENT AND PLAN: Persistent Atrial Fibrillation (ICD10:  I48.19) The patient's CHA2DS2-VASc score is 4, indicating a 4.8% annual risk of stroke.   S/p afib ablation 2019 S/p dofetilide loading 11/29-12/2/22 Patient appears to be maintaining SR with rare, brief episodes. If his symptoms become more frequent/persistent, could consider repeat ablation.  Continue dofetilide 500 mcg BID, QT stable Check bmet/mag/cbc today Continue Eliquis 5 mg BID   Secondary Hypercoagulable State (ICD10:  D68.69) The patient is at significant risk  for stroke/thromboembolism based upon his CHA2DS2-VASc Score of 4.  Continue Apixaban (Eliquis).   HTN Stable on current regimen  CAD On Plavix and statin No anginal symptoms  Chronic HFimpEF EF normalized on TEE 05/29/21 Fluid status appears stable   Follow up in the AF clinic in 6 months.    Jorja Loa [name] Afib Clinic [facility] [address] [place], [place] [address] [phone]

## 2023-04-07 ENCOUNTER — Telehealth: Payer: Self-pay | Admitting: Internal Medicine

## 2023-04-07 NOTE — Telephone Encounter (Signed)
Spoke again to patient spouse, does have diabetic and heart issues.  Best available  days are Monday, Wednesday any times. Tuesday, Thursday or Friday after 10:30 am or afternoons due to his work schedule

## 2023-04-07 NOTE — Telephone Encounter (Deleted)
Pt wants to establish care. Please review patient chart and advise.

## 2023-04-07 NOTE — Telephone Encounter (Signed)
Patient spouse called upset.  Received a phone call from our office asking to reschedule appointment, provider out of office, did not like next opening til November.Patient is diabetic and will need his medications before November.  Patient spouse asking for a call back from office manager ASAP or if provider will approve patient to be seen sooner than November appointment, original appointment was 10.16.2024 , does not want any other provider. Per spouse was told he could go to the urgent care. Was original Dr Sudie Bailey patient. Ozil Stettler call back # [phone].

## 2023-04-08 NOTE — Telephone Encounter (Signed)
Spoke to patient spouse. Scheduled

## 2023-04-15 ENCOUNTER — Encounter: Payer: Self-pay | Admitting: Internal Medicine

## 2023-04-15 ENCOUNTER — Ambulatory Visit: Payer: Federal, State, Local not specified - PPO | Admitting: Internal Medicine

## 2023-04-15 VITALS — BP 119/76 | HR 72 | Ht 73.0 in | Wt 220.0 lb

## 2023-04-15 DIAGNOSIS — I10 Essential (primary) hypertension: Secondary | ICD-10-CM

## 2023-04-15 DIAGNOSIS — Z23 Encounter for immunization: Secondary | ICD-10-CM

## 2023-04-15 DIAGNOSIS — I5042 Chronic combined systolic (congestive) and diastolic (congestive) heart failure: Secondary | ICD-10-CM | POA: Diagnosis not present

## 2023-04-15 DIAGNOSIS — Z1329 Encounter for screening for other suspected endocrine disorder: Secondary | ICD-10-CM

## 2023-04-15 DIAGNOSIS — I4819 Other persistent atrial fibrillation: Secondary | ICD-10-CM | POA: Diagnosis not present

## 2023-04-15 DIAGNOSIS — Z7984 Long term (current) use of oral hypoglycemic drugs: Secondary | ICD-10-CM | POA: Diagnosis not present

## 2023-04-15 DIAGNOSIS — I25810 Atherosclerosis of coronary artery bypass graft(s) without angina pectoris: Secondary | ICD-10-CM

## 2023-04-15 DIAGNOSIS — E785 Hyperlipidemia, unspecified: Secondary | ICD-10-CM

## 2023-04-15 DIAGNOSIS — E119 Type 2 diabetes mellitus without complications: Secondary | ICD-10-CM | POA: Diagnosis not present

## 2023-04-15 DIAGNOSIS — Z862 Personal history of diseases of the blood and blood-forming organs and certain disorders involving the immune mechanism: Secondary | ICD-10-CM | POA: Diagnosis not present

## 2023-04-15 DIAGNOSIS — Z1159 Encounter for screening for other viral diseases: Secondary | ICD-10-CM

## 2023-04-15 DIAGNOSIS — E876 Hypokalemia: Secondary | ICD-10-CM

## 2023-04-15 DIAGNOSIS — Z1211 Encounter for screening for malignant neoplasm of colon: Secondary | ICD-10-CM | POA: Insufficient documentation

## 2023-04-15 MED ORDER — FUROSEMIDE 40 MG PO TABS
40.0000 mg | ORAL_TABLET | Freq: Two times a day (BID) | ORAL | 1 refills | Status: DC | PRN
Start: 2023-04-15 — End: 2024-02-13

## 2023-04-15 MED ORDER — NEBIVOLOL HCL 20 MG PO TABS: 1.0000 | ORAL_TABLET | Freq: Every morning | ORAL | 3 refills | Status: DC

## 2023-04-15 MED ORDER — ACCU-CHEK AVIVA PLUS VI STRP: ORAL_STRIP | 12 refills | Status: AC

## 2023-04-15 MED ORDER — POTASSIUM CHLORIDE CRYS ER 20 MEQ PO TBCR
40.0000 meq | EXTENDED_RELEASE_TABLET | Freq: Every day | ORAL | 3 refills | Status: DC
Start: 2023-04-15 — End: 2024-04-19

## 2023-04-15 MED ORDER — ATORVASTATIN CALCIUM 80 MG PO TABS
80.0000 mg | ORAL_TABLET | Freq: Every day | ORAL | 3 refills | Status: DC
Start: 2023-04-15 — End: 2024-04-19

## 2023-04-15 MED ORDER — ISOSORBIDE MONONITRATE ER 30 MG PO TB24
30.0000 mg | ORAL_TABLET | Freq: Every morning | ORAL | 3 refills | Status: DC
Start: 2023-04-15 — End: 2024-04-19

## 2023-04-15 MED ORDER — SITAGLIPTIN PHOSPHATE 100 MG PO TABS
100.0000 mg | ORAL_TABLET | Freq: Every morning | ORAL | 3 refills | Status: DC
Start: 2023-04-15 — End: 2024-04-19

## 2023-04-15 MED ORDER — DAPAGLIFLOZIN PROPANEDIOL 10 MG PO TABS
10.0000 mg | ORAL_TABLET | Freq: Every morning | ORAL | 3 refills | Status: DC
Start: 2023-04-15 — End: 2024-04-19

## 2023-04-15 NOTE — Assessment & Plan Note (Signed)
LDL at goal when updated earlier this year.  He is currently prescribed atorvastatin 80 mg daily.

## 2023-04-15 NOTE — Assessment & Plan Note (Signed)
Colonoscopy last completed after undergoing CABG in 2008.  Gastroenterology referral placed today for repeat colonoscopy.  He also endorses a change in stool quality, describing stools for the last few months as ribbonlike.

## 2023-04-15 NOTE — Assessment & Plan Note (Signed)
Euvolemic on exam today.  EF recovered on TEE from November 2022.  He is currently prescribed nebivolol, Farxiga, and losartan.  No medication changes are indicated today.

## 2023-04-15 NOTE — Assessment & Plan Note (Signed)
Adequately controlled on current antihypertensive regimen consisting of losartan 50 mg daily, Imdur 30 mg daily, and nebivolol 20 mg daily.  No medication changes are indicated today.

## 2023-04-15 NOTE — Progress Notes (Signed)
New Patient Office Visit  Subjective    Patient ID: [name], male    DOB: Nov 08, 1960  Age: 62 y.o. MRN: [id number]  CC:  Chief Complaint  Patient presents with   Establish Care    Previous [facility] pt. Needs refills on Meds.     HPI [name] presents to establish care.  He is a 61 year old male with a past medical history significant for CAD s/p CABG x 4 (2008) and PCI (2022), A-fib, T2DM, HTN, CHF, IDA, and HLD.  Previously followed by Dr. Sudie Bailey.  [name] reports feeling well today.  He is asymptomatic currently.  His acute concern today is requesting referral to gastroenterology for colon cancer screening as he has noticed ribbonlike stools for the last month.  He has read that this could be indicative of an obstruction.  Colonoscopy was last completed shortly after he underwent CABG in 2008.  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  He endorses former tobacco use, quitting over 20 years ago.  He endorses occasional alcohol consumption and denies illicit drug use.  Family medical history is significant for CAD, DM, HLD, and prostate cancer.  Acute concerns, chronic medical conditions, and outstanding preventative care items discussed today are individually addressed A/P below.   Outpatient Encounter Medications as of 04/15/2023  Medication Sig   apixaban (ELIQUIS) 5 MG TABS tablet Take 1 tablet (5 mg total) by mouth 2 (two) times daily.   clopidogrel (PLAVIX) 75 MG tablet TAKE ONE TABLET (75 MG TOTAL) BY MOUTH DAILY WITH BREAKFAST.   dofetilide (TIKOSYN) 500 MCG capsule TAKE ONE CAPSULE ( TOTAL) BY MOUTHTWO TIMES DAILY appt req for refill [id number]   glimepiride (AMARYL) 4 MG tablet Take 4 mg by mouth daily with breakfast.   glucose blood (ACCU-CHEK AVIVA PLUS) test strip Use as instructed   losartan (COZAAR) 50 MG tablet Take 50 mg by mouth every evening.   metFORMIN (GLUCOPHAGE) 1000 MG tablet Take 1 tablet (1,000 mg total) by  mouth 2 (two) times daily with a meal. Resume on 05/19/21   Multiple Vitamins-Minerals (MULTIVITAMIN WITH MINERALS) tablet Take 1 tablet by mouth 2 (two) times daily.    nitroGLYCERIN (NITROSTAT) 0.4 MG SL tablet Place 1 tablet (0.4 mg total) under the tongue every 5 (five) minutes as needed for chest pain.   [DISCONTINUED] atorvastatin (LIPITOR) 80 MG tablet TAKE ONE TABLET BY MOUTH EVERY NIGHT AT BEDTIME   [DISCONTINUED] dapagliflozin propanediol (FARXIGA) 10 MG TABS tablet Take 10 mg by mouth in the morning.   [DISCONTINUED] furosemide (LASIX) 40 MG tablet Take 1 tablet (40 mg total) by mouth 2 (two) times daily as needed for fluid.   [DISCONTINUED] isosorbide mononitrate (IMDUR) 30 MG 24 hr tablet Take 30 mg by mouth in the morning.   [DISCONTINUED] JANUVIA 100 MG tablet Take 100 mg by mouth in the morning.   [DISCONTINUED] Nebivolol HCl 20 MG TABS TAKE 1 TABLET EVERY MORNING   [DISCONTINUED] potassium chloride SA (KLOR-CON M) 20 MEQ tablet Take 2 tablets (40 mEq total) by mouth daily.   atorvastatin (LIPITOR) 80 MG tablet Take 1 tablet (80 mg total) by mouth at bedtime.   Cholecalciferol (VITAMIN D-3 PO) Take 1 tablet by mouth in the morning. (Patient not taking: Reported on 04/15/2023)   dapagliflozin propanediol (FARXIGA) 10 MG TABS tablet Take 1 tablet (10 mg total) by mouth in the morning.   ferrous sulfate 325 (65 FE) MG tablet Take 325 mg  by mouth daily. (Patient not taking: Reported on 04/15/2023)   furosemide (LASIX) 40 MG tablet Take 1 tablet (40 mg total) by mouth 2 (two) times daily as needed for fluid.   isosorbide mononitrate (IMDUR) 30 MG 24 hr tablet Take 1 tablet (30 mg total) by mouth in the morning.   Nebivolol HCl 20 MG TABS Take 1 tablet (20 mg total) by mouth every morning.   potassium chloride SA (KLOR-CON M) 20 MEQ tablet Take 2 tablets (40 mEq total) by mouth daily.   sitaGLIPtin (JANUVIA) 100 MG tablet Take 1 tablet (100 mg total) by mouth in the morning.   No  facility-administered encounter medications on file as of 04/15/2023.    Past Medical History:  Diagnosis Date   Abdominal distention    Anemia    Postoperative   Chronic diastolic CHF (congestive heart failure) (HCC)    Coronary artery disease    a. premature - acute MI age 77 s/p BMS to prox LAD. b. s/p DES to RCA, PLA, mLAD in 2007. c. 4V CABG in 2008 with LIMA to LAD, SVG to first diagonal, SVG to first OM, SVG to PDA   Diabetes (HCC)    Edema    H/O gastroesophageal reflux (GERD)    High cholesterol    Hypertension    Myocardial infarction (HCC)    Obesity    Persistent atrial fibrillation (HCC)    a. on Tikosyn, Xarelto.   Pleural effusion    resolved with therapy    Past Surgical History:  Procedure Laterality Date   ABLATION OF DYSRHYTHMIC FOCUS  09/23/2017   ATRIAL FIBRILLATION ABLATION N/A 09/23/2017   Procedure: ATRIAL FIBRILLATION ABLATION;  Surgeon: Hillis Range, MD;  Location: [facility] INVASIVE CV LAB;  Service: Cardiovascular;  Laterality: N/A;   BUBBLE STUDY  05/29/2021   Procedure: BUBBLE STUDY;  Surgeon: [name], MD;  Location: [facility] ENDOSCOPY;  Service: Cardiovascular;;   CARDIAC CATHETERIZATION  2008   CARDIOVERSION N/A 05/29/2021   Procedure: CARDIOVERSION;  Surgeon: [name], MD;  Location: [facility] ENDOSCOPY;  Service: Cardiovascular;  Laterality: N/A;   CORONARY ANGIOPLASTY  1996   CORONARY ARTERY BYPASS GRAFT  2008   LIMA-LAD, SVG-Dx, SVG-OM1, SVG-PDA   CORONARY ATHERECTOMY N/A 05/16/2021   Procedure: CORONARY ATHERECTOMY;  Surgeon: Corky Crafts, MD;  Location: [facility] INVASIVE CV LAB;  Service: Cardiovascular;  Laterality: N/A;   CORONARY STENT INTERVENTION N/A 05/01/2021   Procedure: CORONARY STENT INTERVENTION;  Surgeon: Corky Crafts, MD;  Location: [facility] INVASIVE CV LAB;  Service: Cardiovascular;  Laterality: N/A;   CORONARY STENT INTERVENTION N/A 05/16/2021   Procedure: CORONARY STENT INTERVENTION;  Surgeon: Corky Crafts, MD;   Location: [facility] INVASIVE CV LAB;  Service: Cardiovascular;  Laterality: N/A;   CORONARY ULTRASOUND/IVUS N/A 05/16/2021   Procedure: Intravascular Ultrasound/IVUS;  Surgeon: Corky Crafts, MD;  Location: [facility] INVASIVE CV LAB;  Service: Cardiovascular;  Laterality: N/A;   LEFT HEART CATH N/A 05/16/2021   Procedure: Left Heart Cath;  Surgeon: Corky Crafts, MD;  Location: [facility] INVASIVE CV LAB;  Service: Cardiovascular;  Laterality: N/A;   LEFT HEART CATH AND CORS/GRAFTS ANGIOGRAPHY N/A 09/25/2016   Procedure: Left Heart Cath and Cors/Grafts Angiography;  Surgeon: Corky Crafts, MD;  Location: [facility] INVASIVE CV LAB;  Service: Cardiovascular;  Laterality: N/A;   LEFT HEART CATH AND CORS/GRAFTS ANGIOGRAPHY N/A 05/01/2021   Procedure: LEFT HEART CATH AND CORS/GRAFTS ANGIOGRAPHY;  Surgeon: Corky Crafts, MD;  Location: [facility] INVASIVE CV LAB;  Service: Cardiovascular;  Laterality: N/A;   TEE WITHOUT CARDIOVERSION N/A 05/29/2021   Procedure: TRANSESOPHAGEAL ECHOCARDIOGRAM (TEE);  Surgeon: [name], MD;  Location: [facility] ENDOSCOPY;  Service: Cardiovascular;  Laterality: N/A;    Family History  Problem Relation Age of Onset   CAD Father    Vascular Disease Father        carotid artery disease    Social History   Socioeconomic History   Marital status: Married    Spouse name: Not on file   Number of children: Not on file   Years of education: 12   Highest education level: Not on file  Occupational History   Occupation: Sports administrator    Employer: [employer]   Occupation: Has cattle and Programme researcher, broadcasting/film/video.  Tobacco Use   Smoking status: Former    Current packs/day: 2.00    Average packs/day: 2.0 packs/day for 30.0 years (60.0 ttl pk-yrs)    Types: Cigarettes   Smokeless tobacco: Never   Tobacco comments:    Former smoker 05/29/2021  Vaping Use   Vaping status: Never Used  Substance and Sexual Activity   Alcohol use: Yes    Alcohol/week: 1.0 standard drink of alcohol     Types: 1 Standard drinks or equivalent per week    Comment: rare   Drug use: No   Sexual activity: Yes    Birth control/protection: None  Other Topics Concern   Not on file  Social History Narrative   Not on file   Social Determinants of Health   Financial Resource Strain: Not on file  Food Insecurity: Not on file  Transportation Needs: Not on file  Physical Activity: Not on file  Stress: Not on file  Social Connections: Not on file  Intimate Partner Violence: Not on file    Review of Systems  Constitutional:  Negative for chills and fever.  HENT:  Negative for sore throat.   Respiratory:  Negative for cough and shortness of breath.   Cardiovascular:  Negative for chest pain, palpitations and leg swelling.  Gastrointestinal:  Negative for abdominal pain, blood in stool, constipation, diarrhea, nausea and vomiting.       Ribbonlike stools  Genitourinary:  Negative for dysuria and hematuria.  Musculoskeletal:  Negative for myalgias.  Skin:  Negative for itching and rash.  Neurological:  Negative for dizziness and headaches.  Psychiatric/Behavioral:  Negative for depression and suicidal ideas.    Objective    BP 119/76 (BP Location: Left Arm, Patient Position: Sitting, Cuff Size: Normal)   Pulse 72   Ht 6\' 1"  (1.854 m)   Wt 220 lb 0.6 oz (99.8 kg)   SpO2 98%   BMI 29.03 kg/m   Physical Exam Vitals reviewed.  Constitutional:      General: He is not in acute distress.    Appearance: Normal appearance. He is not ill-appearing.  HENT:     Head: Normocephalic and atraumatic.     Right Ear: External ear normal.     Left Ear: External ear normal.     Nose: Nose normal. No congestion or rhinorrhea.     Mouth/Throat:     Mouth: Mucous membranes are moist.     Pharynx: Oropharynx is clear.  Eyes:     General: No scleral icterus.    Extraocular Movements: Extraocular movements intact.     Conjunctiva/sclera: Conjunctivae normal.     Pupils: Pupils are equal, round, and  reactive to light.  Cardiovascular:     Rate and Rhythm: Normal  rate and regular rhythm.     Pulses: Normal pulses.     Heart sounds: Normal heart sounds. No murmur heard. Pulmonary:     Effort: Pulmonary effort is normal.     Breath sounds: Normal breath sounds. No wheezing, rhonchi or rales.  Abdominal:     General: Abdomen is flat. Bowel sounds are normal. There is no distension.     Palpations: Abdomen is soft.     Tenderness: There is no abdominal tenderness.  Musculoskeletal:        General: No swelling or deformity. Normal range of motion.     Cervical back: Normal range of motion.  Skin:    General: Skin is warm and dry.     Capillary Refill: Capillary refill takes less than 2 seconds.  Neurological:     General: No focal deficit present.     Mental Status: He is alert and oriented to person, place, and time.     Motor: No weakness.  Psychiatric:        Mood and Affect: Mood normal.        Behavior: Behavior normal.        Thought Content: Thought content normal.   Last CBC Lab Results  Component Value Date   WBC 13.2 (H) 03/05/2023   HGB 14.2 03/05/2023   HCT 44.3 03/05/2023   MCV 91.7 03/05/2023   MCH 29.4 03/05/2023   RDW 13.3 03/05/2023   PLT 246 03/05/2023   Last metabolic panel Lab Results  Component Value Date   GLUCOSE 145 (H) 03/05/2023   NA 138 03/05/2023   K 4.5 03/05/2023   CL 108 03/05/2023   CO2 20 (L) 03/05/2023   BUN 18 03/05/2023   CREATININE 0.93 03/05/2023   GFRNONAA >60 03/05/2023   CALCIUM 9.5 03/05/2023   PROT 8.1 09/24/2016   ALBUMIN 4.5 09/24/2016   BILITOT 0.7 09/24/2016   ALKPHOS 34 (L) 09/24/2016   AST 25 09/24/2016   ALT 33 09/24/2016   ANIONGAP 10 03/05/2023   Last lipids Lab Results  Component Value Date   CHOL 91 09/24/2016   HDL 23 (L) 09/24/2016   LDLCALC 49 09/24/2016   TRIG 97 09/24/2016   CHOLHDL 4.0 09/24/2016   Last hemoglobin A1c Lab Results  Component Value Date   HGBA1C 8.6 (H) 09/24/2016   Last  thyroid functions Lab Results  Component Value Date   TSH 0.469 03/27/2021   Assessment & Plan:   Problem List Items Addressed This Visit       Persistent atrial fibrillation (HCC) (Chronic)    Regular rate and rhythm detected on exam today.  Followed by A-fib clinic.  Last seen for follow-up in September.  Currently prescribed Eliquis, Tikosyn, and nebivolol.  No medication changes are indicated today.      Essential hypertension (Chronic)    Adequately controlled on current antihypertensive regimen consisting of losartan 50 mg daily, Imdur 30 mg daily, and nebivolol 20 mg daily.  No medication changes are indicated today.      Chronic combined systolic and diastolic CHF (congestive heart failure) (HCC)    Euvolemic on exam today.  EF recovered on TEE from November 2022.  He is currently prescribed nebivolol, Farxiga, and losartan.  No medication changes are indicated today.      CAD (coronary artery disease)    History of CAD s/p CABG x 4 in 2008.  Underwent LHC with stenting in November 2022.  Denies recent chest pain.  He is followed by  cardiology.  Currently prescribed Plavix, atorvastatin, Imdur, and nebivolol.  No medication changes are indicated today.      Non-insulin dependent type 2 diabetes mellitus (HCC) (Chronic)    A1c 8.3 in February.  He is currently prescribed metformin 1000 mg twice daily, glimepiride 4 mg twice daily, Januvia 100 mg daily, and Farxiga 10 mg daily.  He states that he has focused on dietary changes aimed at lowering his blood sugar over the last 6 months. -Repeat A1c and urine microalbumin/creatinine ratio ordered today      Dyslipidemia, goal LDL below 70 (Chronic)    LDL at goal when updated earlier this year.  He is currently prescribed atorvastatin 80 mg daily.      ANEMIA, IRON DEFICIENCY, HX OF    History of iron deficiency anemia.  He is currently taking oral iron supplementation.  Repeat CBC and iron studies ordered today.      Colon  cancer screening - Primary    Colonoscopy last completed after undergoing CABG in 2008.  Gastroenterology referral placed today for repeat colonoscopy.  He also endorses a change in stool quality, describing stools for the last few months as ribbonlike.      Need for influenza vaccination    Influenza vaccine administered today      Return in about 6 months (around 10/14/2023).   Billie Lade, MD

## 2023-04-15 NOTE — Assessment & Plan Note (Signed)
History of iron deficiency anemia.  He is currently taking oral iron supplementation.  Repeat CBC and iron studies ordered today.

## 2023-04-15 NOTE — Assessment & Plan Note (Signed)
Regular rate and rhythm detected on exam today.  Followed by A-fib clinic.  Last seen for follow-up in September.  Currently prescribed Eliquis, Tikosyn, and nebivolol.  No medication changes are indicated today.

## 2023-04-15 NOTE — Assessment & Plan Note (Signed)
History of CAD s/p CABG x 4 in 2008.  Underwent LHC with stenting in November 2022.  Denies recent chest pain.  He is followed by cardiology.  Currently prescribed Plavix, atorvastatin, Imdur, and nebivolol.  No medication changes are indicated today.

## 2023-04-15 NOTE — Assessment & Plan Note (Addendum)
A1c 8.3 in February.  He is currently prescribed metformin 1000 mg twice daily, glimepiride 4 mg twice daily, Januvia 100 mg daily, and Farxiga 10 mg daily.  He states that he has focused on dietary changes aimed at lowering his blood sugar over the last 6 months. -Repeat A1c and urine microalbumin/creatinine ratio ordered today

## 2023-04-15 NOTE — Patient Instructions (Signed)
It was a pleasure to see you today.  Thank you for giving [place] the opportunity to be involved in your care.  Below is a brief recap of your visit and next steps.  We will plan to see you again in 6 months.  Summary You have established care today We will check basic labs Gastroenterology referral placed for screening colonoscopy Medications refilled We will plan for follow up in 6 months

## 2023-04-15 NOTE — Assessment & Plan Note (Signed)
Influenza vaccine administered today.

## 2023-04-16 ENCOUNTER — Ambulatory Visit: Payer: Federal, State, Local not specified - PPO | Admitting: Internal Medicine

## 2023-04-16 LAB — IRON,TIBC AND FERRITIN PANEL
Ferritin: 39 ng/mL (ref 30–400)
Iron Saturation: 18 % (ref 15–55)
Iron: 63 ug/dL (ref 38–169)
Total Iron Binding Capacity: 358 ug/dL (ref 250–450)
UIBC: 295 ug/dL (ref 111–343)

## 2023-04-16 LAB — B12 AND FOLATE PANEL
Folate: 19 ng/mL (ref >3.0)
Vitamin B-12: 320 pg/mL (ref 232–1245)

## 2023-04-16 LAB — CBC WITH DIFFERENTIAL/PLATELET
Basophils Absolute: 0.1 10*3/uL (ref 0.0–0.2)
Basos: 1 %
EOS (ABSOLUTE): 0.1 10*3/uL (ref 0.0–0.4)
Eos: 1 %
Hematocrit: 42.8 % (ref 37.5–51.0)
Hemoglobin: 13.9 g/dL (ref 13.0–17.7)
Immature Grans (Abs): 0.1 10*3/uL (ref 0.0–0.1)
Immature Granulocytes: 1 %
Lymphocytes Absolute: 2.4 10*3/uL (ref 0.7–3.1)
Lymphs: 19 %
MCH: 29.3 pg (ref 26.6–33.0)
MCHC: 32.5 g/dL (ref 31.5–35.7)
MCV: 90 fL (ref 79–97)
Monocytes Absolute: 0.8 10*3/uL (ref 0.1–0.9)
Monocytes: 7 %
Neutrophils Absolute: 9.4 10*3/uL — ABNORMAL HIGH (ref 1.4–7.0)
Neutrophils: 71 %
Platelets: 235 10*3/uL (ref 150–450)
RBC: 4.74 x10E6/uL (ref 4.14–5.80)
RDW: 14.2 % (ref 11.6–15.4)
WBC: 13 10*3/uL — ABNORMAL HIGH (ref 3.4–10.8)

## 2023-04-16 LAB — TSH+FREE T4
Free T4: 1.47 ng/dL (ref 0.82–1.77)
TSH: 0.481 u[IU]/mL (ref 0.450–4.500)

## 2023-04-16 LAB — LIPID PANEL
Chol/HDL Ratio: 3 {ratio} (ref 0.0–5.0)
Cholesterol, Total: 82 mg/dL — ABNORMAL LOW (ref 100–199)
HDL: 27 mg/dL — ABNORMAL LOW (ref >39)
LDL Chol Calc (NIH): 40 mg/dL (ref 0–99)
Triglycerides: 69 mg/dL (ref 0–149)
VLDL Cholesterol Cal: 15 mg/dL (ref 5–40)

## 2023-04-16 LAB — HEMOGLOBIN A1C
Est. average glucose Bld gHb Est-mCnc: 163 mg/dL
Hgb A1c MFr Bld: 7.3 % — ABNORMAL HIGH (ref 4.8–5.6)

## 2023-04-16 LAB — MICROALBUMIN / CREATININE URINE RATIO
Creatinine, Urine: 46.2 mg/dL
Microalb/Creat Ratio: 16 mg/g{creat} (ref 0–29)
Microalbumin, Urine: 7.6 ug/mL

## 2023-04-16 LAB — CMP14+EGFR
ALT: 25 [IU]/L (ref 0–44)
AST: 17 [IU]/L (ref 0–40)
Albumin: 4.6 g/dL (ref 3.9–4.9)
Alkaline Phosphatase: 38 [IU]/L — ABNORMAL LOW (ref 44–121)
BUN/Creatinine Ratio: 18 (ref 10–24)
BUN: 19 mg/dL (ref 8–27)
Bilirubin Total: 0.4 mg/dL (ref 0.0–1.2)
CO2: 19 mmol/L — ABNORMAL LOW (ref 20–29)
Calcium: 10.2 mg/dL (ref 8.6–10.2)
Chloride: 106 mmol/L (ref 96–106)
Creatinine, Ser: 1.04 mg/dL (ref 0.76–1.27)
Globulin, Total: 2.5 g/dL (ref 1.5–4.5)
Glucose: 124 mg/dL — ABNORMAL HIGH (ref 70–99)
Potassium: 4.1 mmol/L (ref 3.5–5.2)
Sodium: 139 mmol/L (ref 134–144)
Total Protein: 7.1 g/dL (ref 6.0–8.5)
eGFR: 82 mL/min/{1.73_m2} (ref >59)

## 2023-04-16 LAB — VITAMIN D 25 HYDROXY (VIT D DEFICIENCY, FRACTURES): Vit D, 25-Hydroxy: 44.4 ng/mL (ref 30.0–100.0)

## 2023-04-16 LAB — HCV INTERPRETATION

## 2023-04-16 LAB — HCV AB W REFLEX TO QUANT PCR: HCV Ab: NONREACTIVE

## 2023-04-23 ENCOUNTER — Telehealth (INDEPENDENT_AMBULATORY_CARE_PROVIDER_SITE_OTHER): Payer: Self-pay | Admitting: Internal Medicine

## 2023-04-23 ENCOUNTER — Encounter: Payer: Self-pay | Admitting: Internal Medicine

## 2023-04-23 ENCOUNTER — Ambulatory Visit (INDEPENDENT_AMBULATORY_CARE_PROVIDER_SITE_OTHER): Payer: Federal, State, Local not specified - PPO | Admitting: Internal Medicine

## 2023-04-23 VITALS — BP 130/78 | HR 71 | Temp 97.9°F | Ht 73.0 in | Wt 226.7 lb

## 2023-04-23 DIAGNOSIS — Z79899 Other long term (current) drug therapy: Secondary | ICD-10-CM | POA: Diagnosis not present

## 2023-04-23 DIAGNOSIS — Z7901 Long term (current) use of anticoagulants: Secondary | ICD-10-CM | POA: Diagnosis not present

## 2023-04-23 DIAGNOSIS — R194 Change in bowel habit: Secondary | ICD-10-CM

## 2023-04-23 DIAGNOSIS — Z1211 Encounter for screening for malignant neoplasm of colon: Secondary | ICD-10-CM

## 2023-04-23 DIAGNOSIS — K219 Gastro-esophageal reflux disease without esophagitis: Secondary | ICD-10-CM

## 2023-04-23 DIAGNOSIS — R195 Other fecal abnormalities: Secondary | ICD-10-CM

## 2023-04-23 NOTE — Telephone Encounter (Signed)
Tried to reach pt to set up tele pre op appt but VM is full could not leave message to call back.

## 2023-04-23 NOTE — Telephone Encounter (Signed)
Patient with diagnosis of afib on Eliquis for anticoagulation.    Procedure: colonoscopy Date of procedure: TBD  CHA2DS2-VASc Score = 4  This indicates a 4.8% annual risk of stroke. The patient's score is based upon: CHF History: 1 HTN History: 1 Diabetes History: 1 Stroke History: 0 Vascular Disease History: 1 Age Score: 0 Gender Score: 0   CrCl 37mL/min Platelet count 235K  Per office protocol, patient can hold Eliquis for 2 days prior to procedure as requested.    **This guidance is not considered finalized until pre-operative APP has relayed final recommendations.**

## 2023-04-23 NOTE — Progress Notes (Signed)
Primary Care Physician:  Billie Lade, MD Primary Gastroenterologist:  Dr. Marletta Lor  Chief Complaint  Patient presents with   change in bowel habits    Patient wants to schedule colonoscopy. States last one about 16 -17 years ago. Stools have changed and look like ribbon. Sees cardiologist Dr. Royann Shivers in [place] on plavix and eliquis per pateint.     HPI:   [name] is a 62 y.o. male who presents to the clinic today by referral from his PCP Dr. Durwin Nora for evaluation.  Patient states he is due for colonoscopy.  Reports last colonoscopy around 16 years ago which he believes was normal.  No family history of colorectal malignancy.  No melena hematochezia.  Has lost nearly 40 pounds over the last 4 years which she attributes to major lifestyle changes.  Does note change in stool caliber.  States his stools are "stringy."  No abdominal pain no mucus in his stool.  Does have mild acid reflux, symptoms are very intermittent.  Takes as needed Rolaids.  No dysphagia odynophagia.  No epigastric or chest pain.  States since he is lost weight his symptoms are much less frequent.  Does have significant cardiac history with previous MI status post DES, CABG, atrial fibrillation.  Chronically on Plavix and Eliquis.  Overall states he feels great.  No complaints.  Very active.  Past Medical History:  Diagnosis Date   Abdominal distention    Anemia    Postoperative   Chronic diastolic CHF (congestive heart failure) (HCC)    Coronary artery disease    a. premature - acute MI age 26 s/p BMS to prox LAD. b. s/p DES to RCA, PLA, mLAD in 2007. c. 4V CABG in 2008 with LIMA to LAD, SVG to first diagonal, SVG to first OM, SVG to PDA   Diabetes (HCC)    Edema    H/O gastroesophageal reflux (GERD)    High cholesterol    Hypertension    Myocardial infarction (HCC)    Obesity    Persistent atrial fibrillation (HCC)    a. on Tikosyn, Xarelto.   Pleural effusion    resolved with therapy     Past Surgical History:  Procedure Laterality Date   ABLATION OF DYSRHYTHMIC FOCUS  09/23/2017   ATRIAL FIBRILLATION ABLATION N/A 09/23/2017   Procedure: ATRIAL FIBRILLATION ABLATION;  Surgeon: Hillis Range, MD;  Location: [facility] INVASIVE CV LAB;  Service: Cardiovascular;  Laterality: N/A;   BUBBLE STUDY  05/29/2021   Procedure: BUBBLE STUDY;  Surgeon: [name], MD;  Location: [facility] ENDOSCOPY;  Service: Cardiovascular;;   CARDIAC CATHETERIZATION  2008   CARDIOVERSION N/A 05/29/2021   Procedure: CARDIOVERSION;  Surgeon: [name], MD;  Location: [facility] ENDOSCOPY;  Service: Cardiovascular;  Laterality: N/A;   CORONARY ANGIOPLASTY  1996   CORONARY ARTERY BYPASS GRAFT  2008   LIMA-LAD, SVG-Dx, SVG-OM1, SVG-PDA   CORONARY ATHERECTOMY N/A 05/16/2021   Procedure: CORONARY ATHERECTOMY;  Surgeon: Corky Crafts, MD;  Location: [facility] INVASIVE CV LAB;  Service: Cardiovascular;  Laterality: N/A;   CORONARY STENT INTERVENTION N/A 05/01/2021   Procedure: CORONARY STENT INTERVENTION;  Surgeon: Corky Crafts, MD;  Location: [facility] INVASIVE CV LAB;  Service: Cardiovascular;  Laterality: N/A;   CORONARY STENT INTERVENTION N/A 05/16/2021   Procedure: CORONARY STENT INTERVENTION;  Surgeon: Corky Crafts, MD;  Location: [facility] INVASIVE CV LAB;  Service: Cardiovascular;  Laterality: N/A;   CORONARY ULTRASOUND/IVUS N/A 05/16/2021   Procedure: Intravascular Ultrasound/IVUS;  Surgeon:  Corky Crafts, MD;  Location: [facility] INVASIVE CV LAB;  Service: Cardiovascular;  Laterality: N/A;   LEFT HEART CATH N/A 05/16/2021   Procedure: Left Heart Cath;  Surgeon: Corky Crafts, MD;  Location: [facility] INVASIVE CV LAB;  Service: Cardiovascular;  Laterality: N/A;   LEFT HEART CATH AND CORS/GRAFTS ANGIOGRAPHY N/A 09/25/2016   Procedure: Left Heart Cath and Cors/Grafts Angiography;  Surgeon: Corky Crafts, MD;  Location: [facility] INVASIVE CV LAB;  Service: Cardiovascular;  Laterality: N/A;   LEFT HEART CATH AND  CORS/GRAFTS ANGIOGRAPHY N/A 05/01/2021   Procedure: LEFT HEART CATH AND CORS/GRAFTS ANGIOGRAPHY;  Surgeon: Corky Crafts, MD;  Location: [facility] INVASIVE CV LAB;  Service: Cardiovascular;  Laterality: N/A;   TEE WITHOUT CARDIOVERSION N/A 05/29/2021   Procedure: TRANSESOPHAGEAL ECHOCARDIOGRAM (TEE);  Surgeon: [name], MD;  Location: [facility] ENDOSCOPY;  Service: Cardiovascular;  Laterality: N/A;    Current Outpatient Medications  Medication Sig Dispense Refill   apixaban (ELIQUIS) 5 MG TABS tablet Take 1 tablet (5 mg total) by mouth 2 (two) times daily. 60 tablet 11   atorvastatin (LIPITOR) 80 MG tablet Take 1 tablet (80 mg total) by mouth at bedtime. 90 tablet 3   clopidogrel (PLAVIX) 75 MG tablet TAKE ONE TABLET (75 MG TOTAL) BY MOUTH DAILY WITH BREAKFAST. 30 tablet 3   dapagliflozin propanediol (FARXIGA) 10 MG TABS tablet Take 1 tablet (10 mg total) by mouth in the morning. 90 tablet 3   dofetilide (TIKOSYN) 500 MCG capsule TAKE ONE CAPSULE ( TOTAL) BY MOUTHTWO TIMES DAILY appt req for refill [id number] 60 capsule 0   furosemide (LASIX) 40 MG tablet Take 1 tablet (40 mg total) by mouth 2 (two) times daily as needed for fluid. 60 tablet 1   glimepiride (AMARYL) 4 MG tablet Take 4 mg by mouth daily with breakfast.     glucose blood (ACCU-CHEK AVIVA PLUS) test strip Use as instructed 100 each 12   isosorbide mononitrate (IMDUR) 30 MG 24 hr tablet Take 1 tablet (30 mg total) by mouth in the morning. 90 tablet 3   losartan (COZAAR) 50 MG tablet Take 50 mg by mouth every evening.     metFORMIN (GLUCOPHAGE) 1000 MG tablet Take 1 tablet (1,000 mg total) by mouth 2 (two) times daily with a meal. Resume on 05/19/21     Multiple Vitamins-Minerals (MULTIVITAMIN WITH MINERALS) tablet Take 1 tablet by mouth 2 (two) times daily.      Nebivolol HCl 20 MG TABS Take 1 tablet (20 mg total) by mouth every morning. 90 tablet 3   nitroGLYCERIN (NITROSTAT) 0.4 MG SL tablet Place 1 tablet (0.4 mg total)  under the tongue every 5 (five) minutes as needed for chest pain. 25 tablet 4   potassium chloride SA (KLOR-CON M) 20 MEQ tablet Take 2 tablets (40 mEq total) by mouth daily. 90 tablet 3   sitaGLIPtin (JANUVIA) 100 MG tablet Take 1 tablet (100 mg total) by mouth in the morning. 90 tablet 3   No current facility-administered medications for this visit.    Allergies as of 04/23/2023   (No Known Allergies)    Family History  Problem Relation Age of Onset   CAD Father    Vascular Disease Father        carotid artery disease    Social History   Socioeconomic History   Marital status: Married    Spouse name: Not on file   Number of children: Not on file   Years of education: 64  Highest education level: Not on file  Occupational History   Occupation: Engineer, petroleum: [employer]   Occupation: Has cattle and Programme researcher, broadcasting/film/video.  Tobacco Use   Smoking status: Former    Current packs/day: 2.00    Average packs/day: 2.0 packs/day for 30.0 years (60.0 ttl pk-yrs)    Types: Cigarettes   Smokeless tobacco: Never   Tobacco comments:    Former smoker 05/29/2021  Vaping Use   Vaping status: Never Used  Substance and Sexual Activity   Alcohol use: Yes    Alcohol/week: 1.0 standard drink of alcohol    Types: 1 Standard drinks or equivalent per week    Comment: rare   Drug use: No   Sexual activity: Yes    Birth control/protection: None  Other Topics Concern   Not on file  Social History Narrative   Not on file   Social Determinants of Health   Financial Resource Strain: Not on file  Food Insecurity: Not on file  Transportation Needs: Not on file  Physical Activity: Not on file  Stress: Not on file  Social Connections: Not on file  Intimate Partner Violence: Not on file    Subjective: Review of Systems  Constitutional:  Negative for chills and fever.  HENT:  Negative for congestion and hearing loss.   Eyes:  Negative for blurred vision and double vision.   Respiratory:  Negative for cough and shortness of breath.   Cardiovascular:  Negative for chest pain and palpitations.  Gastrointestinal:  Positive for heartburn. Negative for abdominal pain, blood in stool, constipation, diarrhea, melena and vomiting.       Change in stool caliber  Genitourinary:  Negative for dysuria and urgency.  Musculoskeletal:  Negative for joint pain and myalgias.  Skin:  Negative for itching and rash.  Neurological:  Negative for dizziness and headaches.  Psychiatric/Behavioral:  Negative for depression. The patient is not nervous/anxious.        Objective: BP 130/78 (BP Location: Left Arm, Patient Position: Sitting, Cuff Size: Large)   Pulse 71   Temp 97.9 F (36.6 C) (Oral)   Ht 6\' 1"  (1.854 m)   Wt 226 lb 11.2 oz (102.8 kg)   BMI 29.91 kg/m  Physical Exam Constitutional:      Appearance: Normal appearance.  HENT:     Head: Normocephalic and atraumatic.  Eyes:     Extraocular Movements: Extraocular movements intact.     Conjunctiva/sclera: Conjunctivae normal.  Cardiovascular:     Rate and Rhythm: Normal rate and regular rhythm.  Pulmonary:     Effort: Pulmonary effort is normal.     Breath sounds: Normal breath sounds.  Abdominal:     General: Bowel sounds are normal.     Palpations: Abdomen is soft.  Musculoskeletal:        General: Normal range of motion.     Cervical back: Normal range of motion and neck supple.  Skin:    General: Skin is warm.  Neurological:     General: No focal deficit present.     Mental Status: He is alert and oriented to person, place, and time.  Psychiatric:        Mood and Affect: Mood normal.        Behavior: Behavior normal.      Assessment: *Chronic GERD-mild, intermittent *Colon cancer screening *Change in stool caliber *High risk medication use *Systemic anticoagulation with Eliquis  Plan: GERD symptoms very mild, intermittent.  Continue Rolaids as needed.  No alarm symptoms today to warrant  further investigation with upper endoscopy.  Will schedule for screening colonoscopy.The risks including infection, bleed, or perforation as well as benefits, limitations, alternatives and imponderables have been reviewed with the patient. Questions have been answered. All parties agreeable.  Will reach out to patient's cardiologist in regards to holding Eliquis x 2 days, Plavix x 5 days.  Appreciate their help immensely in this regard.  Thank you Dr. Durwin Nora for the kind referral.  04/23/2023 8:36 AM   Disclaimer: This note was dictated with voice recognition software. Similar sounding words can inadvertently be transcribed and may not be corrected upon review.

## 2023-04-23 NOTE — Patient Instructions (Addendum)
We will schedule you for colonoscopy for colon cancer screening purposes as well as change in bowel habits.  We will reach out to your cardiologist prior to scheduling for their blessing in holding your Eliquis x 2 days and Plavix x 5 days.  Once we have heard back from them we will call you to schedule your procedure.  Continue Rolaids as needed for reflux.  It was very nice meeting you today.  Dr. Marletta Lor

## 2023-04-23 NOTE — Telephone Encounter (Signed)
    Primary Cardiologist:Mihai Croitoru, MD  Chart reviewed as part of pre-operative protocol coverage. Because of [name]'s past medical history and time since last visit, he/she will require a follow-up visit in order to better assess preoperative cardiovascular risk.  Pre-op covering staff: - Please schedule  in office appointment and call patient to inform them. - Please contact requesting surgeon's office via preferred method (i.e, phone, fax) to inform them of need for appointment prior to surgery.  If applicable, this message will also be routed to pharmacy pool and/or primary cardiologist for input on holding anticoagulant/antiplatelet agent as requested below so that this information is available at time of patient's appointment.   Ronney Asters, NP  04/23/2023, 11:09 AM

## 2023-04-23 NOTE — Telephone Encounter (Signed)
    04/23/23  [name] [birth year]  What type of surgery is being performed? Colonoscopy  When is surgery scheduled? TBD  What type of clearance is required (medical or pharmacy to hold medication or both? Cardiac Clearance  Are there any medications that need to be held prior to surgery and how long? Clearance to hold Plavix for 5 days and Eliquis for 2 days  Name of physician performing surgery?  Dr. Verl Bangs [facility] at [facility] Phone: [phone] Fax: [phone]  Anethesia type (none, local, MAC, general)? MAC

## 2023-04-24 NOTE — Telephone Encounter (Signed)
Patient scheduled to see Edd Fabian, NP on 04/28/23 for  clearance.

## 2023-04-25 NOTE — Progress Notes (Unsigned)
Cardiology Clinic Note   Patient Name: TALIS GREENLIEF Date of Encounter: 04/28/2023  Primary Care Provider:  Billie Lade, MD Primary Cardiologist:  Thurmon Fair, MD  Patient Profile    [name] 62 year old male presents the clinic today for follow-up evaluation of his persistent atrial fibrillation and essential hypertension.  Past Medical History    Past Medical History:  Diagnosis Date   Abdominal distention    Anemia    Postoperative   Chronic diastolic CHF (congestive heart failure) (HCC)    Coronary artery disease    a. premature - acute MI age 5 s/p BMS to prox LAD. b. s/p DES to RCA, PLA, mLAD in 2007. c. 4V CABG in 2008 with LIMA to LAD, SVG to first diagonal, SVG to first OM, SVG to PDA   Diabetes (HCC)    Edema    H/O gastroesophageal reflux (GERD)    High cholesterol    Hypertension    Myocardial infarction (HCC)    Obesity    Persistent atrial fibrillation (HCC)    a. on Tikosyn, Xarelto.   Pleural effusion    resolved with therapy   Past Surgical History:  Procedure Laterality Date   ABLATION OF DYSRHYTHMIC FOCUS  09/23/2017   ATRIAL FIBRILLATION ABLATION N/A 09/23/2017   Procedure: ATRIAL FIBRILLATION ABLATION;  Surgeon: Hillis Range, MD;  Location: [facility] INVASIVE CV LAB;  Service: Cardiovascular;  Laterality: N/A;   BUBBLE STUDY  05/29/2021   Procedure: BUBBLE STUDY;  Surgeon: [name], MD;  Location: [facility] ENDOSCOPY;  Service: Cardiovascular;;   CARDIAC CATHETERIZATION  2008   CARDIOVERSION N/A 05/29/2021   Procedure: CARDIOVERSION;  Surgeon: [name], MD;  Location: [facility] ENDOSCOPY;  Service: Cardiovascular;  Laterality: N/A;   CORONARY ANGIOPLASTY  1996   CORONARY ARTERY BYPASS GRAFT  2008   LIMA-LAD, SVG-Dx, SVG-OM1, SVG-PDA   CORONARY ATHERECTOMY N/A 05/16/2021   Procedure: CORONARY ATHERECTOMY;  Surgeon: Corky Crafts, MD;  Location: [facility] INVASIVE CV LAB;  Service: Cardiovascular;  Laterality: N/A;   CORONARY STENT  INTERVENTION N/A 05/01/2021   Procedure: CORONARY STENT INTERVENTION;  Surgeon: Corky Crafts, MD;  Location: [facility] INVASIVE CV LAB;  Service: Cardiovascular;  Laterality: N/A;   CORONARY STENT INTERVENTION N/A 05/16/2021   Procedure: CORONARY STENT INTERVENTION;  Surgeon: Corky Crafts, MD;  Location: [facility] INVASIVE CV LAB;  Service: Cardiovascular;  Laterality: N/A;   CORONARY ULTRASOUND/IVUS N/A 05/16/2021   Procedure: Intravascular Ultrasound/IVUS;  Surgeon: Corky Crafts, MD;  Location: [facility] INVASIVE CV LAB;  Service: Cardiovascular;  Laterality: N/A;   LEFT HEART CATH N/A 05/16/2021   Procedure: Left Heart Cath;  Surgeon: Corky Crafts, MD;  Location: [facility] INVASIVE CV LAB;  Service: Cardiovascular;  Laterality: N/A;   LEFT HEART CATH AND CORS/GRAFTS ANGIOGRAPHY N/A 09/25/2016   Procedure: Left Heart Cath and Cors/Grafts Angiography;  Surgeon: Corky Crafts, MD;  Location: [facility] INVASIVE CV LAB;  Service: Cardiovascular;  Laterality: N/A;   LEFT HEART CATH AND CORS/GRAFTS ANGIOGRAPHY N/A 05/01/2021   Procedure: LEFT HEART CATH AND CORS/GRAFTS ANGIOGRAPHY;  Surgeon: Corky Crafts, MD;  Location: [facility] INVASIVE CV LAB;  Service: Cardiovascular;  Laterality: N/A;   TEE WITHOUT CARDIOVERSION N/A 05/29/2021   Procedure: TRANSESOPHAGEAL ECHOCARDIOGRAM (TEE);  Surgeon: [name], MD;  Location: [facility] ENDOSCOPY;  Service: Cardiovascular;  Laterality: N/A;    Allergies  No Known Allergies  History of Present Illness    Bentlie B Lamanna has a PMH of persistent  atrial fibrillation, HTN, chronic combined systolic and diastolic CHF, coronary artery disease status post CABG x4 in 2008 (underwent subsequent cardiac catheterization 3/18 which showed patent L-LAD, S-diagonal, S-OM 1, S-PDA), GERD, type 2 diabetes, HLD, and obesity.  He was found to have reduced ejection fraction during his repeat ablation for atrial fibrillation.  EF was noted to be 30-35%.  He was found to have  new coronary stenosis.  He underwent cardiac catheterization on 05/01/2021 and received stenting to his SVG-OM.  This did not improve his symptoms.  He underwent rotational arthrectomy and DES to his left coronary artery and proximal left circumflex by Dr.Varanasi.  After his second revascularization he felt substantially improved.  After his revascularization he started dofetilide treatment and underwent TEE guided cardioversion 05/29/2021.  His ejection fraction improved to 55-60%.  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  His NYHA was a class II.  His weight remains stable 230-222 LBS.  He continues to take apixaban twice daily and clopidogrel 75 mg daily.  He presented to the clinic 03/25/22 for follow-up evaluation and stated he was a little bit short of breath .  He reported some dietary indiscretion over the weekend and a 2 pound weight increase.  He reported that when he had weight increase he planned to take an extra half dose of his furosemide which works well.  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.  He reported compliance with his medications and had lab work recently drawn at his PCP.  I  requested labs from his PCP, gave salty 6 diet information, planned follow-up for 8 to 9 months.    He was seen in follow-up by Cherlyn Labella, PA on 03/05/2023.  He remained stable from a heart standpoint.  He did note some fast heartbeats occasionally that were brief.  He was not symptomatic.  He denied sustained palpitations.  He was tolerating his dofetilide well.  He had been more physically active and had lost around 40 pounds.  He denied bleeding issues.  He had been compliant with his anticoagulation.  He presents to the clinic today for preoperative cardiac evaluation and follow-up evaluation.  He states he is feeling very well.  He continues to lose some weight.  This is related to  his increased physical activity.  He reports over the last 6 months he is feeling much better due to being more active and his weight.  We reviewed his previous cardiac catheterization.  He reports diarrhea with his Plavix.  This is happened before.  I will have him start 81 mg aspirin and discontinue his Plavix after his colonoscopy.  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  He reports that he has now seen Dr. Durwin Nora as his PCP.  His blood pressure is well-controlled today.  We reviewed his recent lab work.  We will plan follow-up in 12 months.   At this time he denies chest pain, shortness of breath, lower extremity edema, fatigue, palpitations, melena, hematuria, hemoptysis, diaphoresis, weakness, presyncope, syncope, orthopnea, and PND.     Home Medications    Prior to Admission medications   Medication Sig Start Date End Date Taking? Authorizing Provider  atorvastatin (LIPITOR) 80 MG tablet Take 1 tablet (80 mg total) by mouth at bedtime. 05/16/21   Micah Flesher  Joni Reining, [place]  Cholecalciferol (VITAMIN D-3 PO) Take 1 tablet by mouth in the morning.    [provider]  clopidogrel (PLAVIX) 75 MG tablet TAKE ONE TABLET (75 MG TOTAL) BY MOUTH DAILY WITH BREAKFAST. 10/31/21   Marjie Skiff E, [name]  dapagliflozin propanediol (FARXIGA) 10 MG TABS tablet Take 10 mg by mouth in the morning.    [provider]  dofetilide (TIKOSYN) 500 MCG capsule TAKE 1 CAPSULE (500 MCG TOTAL) BY MOUTH TWO TIMES DAILY. 12/19/21   Allred, Fayrene Fearing, MD  ELIQUIS 5 MG TABS tablet TAKE ONE TABLET BY MOUTH TWICE A DAY 10/16/21   Croitoru, Mihai, MD  ferrous sulfate 325 (65 FE) MG tablet Take 325 mg by mouth daily.    [provider]  furosemide (LASIX) 40 MG tablet Take 40 mg by mouth daily. 03/26/14   [provider]  glimepiride (AMARYL) 4 MG tablet Take 4 mg by mouth daily with breakfast.     [provider]  isosorbide mononitrate (IMDUR) 30 MG 24 hr tablet Take 30 mg by mouth in the morning.    [provider]  JANUVIA 100 MG tablet Take 100 mg by mouth in the morning. 02/05/14   [provider]  losartan (COZAAR) 50 MG tablet Take 50 mg by mouth every evening.    [provider]  metFORMIN (GLUCOPHAGE) 1000 MG tablet Take 1 tablet (1,000 mg total) by mouth 2 (two) times daily with a meal. Resume on 05/19/21 05/16/21   Marcelino Duster, PA  Multiple Vitamins-Minerals (MULTIVITAMIN WITH MINERALS) tablet Take 1 tablet by mouth 2 (two) times daily.     [provider]  Nebivolol HCl (BYSTOLIC) 20 MG TABS Take 1 tablet (20 mg total) by mouth every morning. 05/16/21   Duke, Roe Rutherford, PA  nitroGLYCERIN (NITROSTAT) 0.4 MG SL tablet Place 1 tablet (0.4 mg total) under the tongue every 5 (five) minutes as needed for chest pain. 09/26/16   Leone Brand, NP  potassium chloride SA (KLOR-CON M) 20 MEQ tablet Take 2 tablets (40 mEq total) by mouth daily. 06/01/21   Graciella Freer, [name]    Family History    Family History  Problem Relation Age of Onset   CAD Father    Vascular Disease Father        carotid artery disease   He indicated that his mother is deceased. He indicated that his father is deceased. He indicated that his sister is alive. He indicated that his brother is deceased. He indicated that his maternal grandmother is deceased. He indicated that his maternal grandfather is deceased. He indicated that his paternal grandmother is deceased. He indicated that his paternal grandfather is deceased.  Social History    Social History   Socioeconomic History   Marital status: Married    Spouse name: Not on file   Number of children: Not on file   Years of education: 12   Highest education level: Not on file  Occupational History   Occupation: Sports administrator    Employer: [employer]   Occupation: Has cattle and  Programme researcher, broadcasting/film/video.  Tobacco Use   Smoking status: Former    Current packs/day: 2.00    Average packs/day: 2.0 packs/day for 30.0 years (60.0 ttl pk-yrs)    Types: Cigarettes   Smokeless tobacco: Never   Tobacco comments:    Former smoker 05/29/2021  Vaping Use   Vaping status: Never Used  Substance and Sexual Activity   Alcohol use: Yes  Alcohol/week: 1.0 standard drink of alcohol    Types: 1 Standard drinks or equivalent per week    Comment: rare   Drug use: No   Sexual activity: Yes    Birth control/protection: None  Other Topics Concern   Not on file  Social History Narrative   Not on file   Social Determinants of Health   Financial Resource Strain: Not on file  Food Insecurity: Not on file  Transportation Needs: Not on file  Physical Activity: Not on file  Stress: Not on file  Social Connections: Not on file  Intimate Partner Violence: Not on file     Review of Systems    General:  No chills, fever, night sweats or weight changes.  Cardiovascular:  No chest pain, dyspnea on exertion, edema, orthopnea, palpitations, paroxysmal nocturnal dyspnea. Dermatological: No rash, lesions/masses Respiratory: No cough, dyspnea Urologic: No hematuria, dysuria Abdominal:   No nausea, vomiting, diarrhea, bright red blood per rectum, melena, or hematemesis Neurologic:  No visual changes, wkns, changes in mental status. All other systems reviewed and are otherwise negative except as noted above.  Physical Exam    VS:  BP 128/86 (BP Location: Left Arm, Patient Position: Sitting, Cuff Size: Normal)   Pulse 80   Ht 6' (1.829 m)   Wt 223 lb 6.4 oz (101.3 kg)   SpO2 98%   BMI 30.30 kg/m  , BMI Body mass index is 30.3 kg/m. GEN: Well nourished, well developed, in no acute distress. HEENT: normal. Neck: Supple, no JVD, carotid bruits, or masses. Cardiac: RRR, no murmurs, rubs, or gallops. No clubbing, cyanosis, edema.  Radials/DP/PT 2+ and equal bilaterally.  Respiratory:   Respirations regular and unlabored, clear to auscultation bilaterally. GI: Soft, nontender, nondistended, BS + x 4. MS: no deformity or atrophy. Skin: warm and dry, no rash. Neuro:  Strength and sensation are intact. Psych: Normal affect.  Accessory Clinical Findings    Recent Labs: 03/05/2023: Magnesium 2.1 04/15/2023: ALT 25; BUN 19; Creatinine, Ser 1.04; Hemoglobin 13.9; Platelets 235; Potassium 4.1; Sodium 139; TSH 0.481   Recent Lipid Panel    Component Value Date/Time   CHOL 82 (L) 04/15/2023 1605   TRIG 69 04/15/2023 1605   HDL 27 (L) 04/15/2023 1605   CHOLHDL 3.0 04/15/2023 1605   CHOLHDL 4.0 09/24/2016 1345   VLDL 19 09/24/2016 1345   LDLCALC 40 04/15/2023 1605         ECG personally reviewed by me today-EKG Interpretation Date/Time:  Monday April 28 2023 09:11:53 EDT Ventricular Rate:  80 PR Interval:  176 QRS Duration:  88 QT Interval:  366 QTC Calculation: 422 R Axis:   102  Text Interpretation: Normal sinus rhythm Rightward axis Septal infarct , age undetermined When compared with ECG of 05-Mar-2023 08:52, No significant change was found Confirmed by Edd Fabian [phone]) on 04/28/2023 9:18:49 AM   EKG 03/25/2022 normal sinus rhythm rightward axis deviation 71 bpm- No acute changes  Echocardiogram 05/29/2021  IMPRESSIONS     1. Left ventricular ejection fraction, by estimation, is 55 to 60%. The  left ventricle has normal function. There is mild left ventricular  hypertrophy.   2. Right ventricular systolic function is mildly reduced. The right  ventricular size is normal.   3. Left atrial size was mildly dilated. No left atrial/left atrial  appendage thrombus was detected.   4. Right atrial size was mildly dilated.   5. The mitral valve is grossly normal. No evidence of mitral valve  regurgitation.  6. The aortic valve is tricuspid. Aortic valve regurgitation is not  visualized.   7. Agitated saline contrast bubble study was negative, with no  evidence  of any interatrial shunt.   Conclusion(s)/Recommendation(s): No LA/LAA thrombus identified. Successful  cardioversion performed with restoration of normal sinus rhythm.   FINDINGS  Assessment & Plan   1. Chronic diastolic CHF-weight today 223 pounds.  Euvolemic.  Continues to be very physically active.  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. Continue current medical therapy Maintain low-salt diet Maintain physical activity  Atrial fibrillation-EKG today shows normal sinus rhythm rightward axis deviation septal infarct undetermined age 68 bpm.  Status post radiofrequency ablation 3/19.  Only notes occasional brief episodes of accelerated heart rate.  Compliant with Eliquis and denies bleeding issues. Continue Eliquis, dofetilide, nebivolol Heart healthy low-sodium diet Avoid triggers caffeine, chocolate, EtOH, dehydration etc.  Coronary artery disease-no recent episodes of arm neck back or chest discomfort.  Maintains activity tolerance.  Denies dyspnea.  Status post CABG and post CABG arthrectomy with DES placement.  Reports diarrhea with Plavix.  Will transition to 81 mg aspirin daily after colonoscopy Continue  Imdur, losartan Start 81 mg aspirin after colonoscopy  Hyperlipidemia-LDL 40 on 04/15/2023.  Continue atorvastatin Heart healthy low-sodium high-fiber diet For follows with PCP  Essential hypertension-BP today 128/80 Continue losartan, NovoLog Maintain blood pressure log Low-sodium diet  Preoperative cardiac evaluation-colonoscopy-Dr. Verl Bangs gastroenterology at [facility], fax #[phone]     Primary Cardiologist: Thurmon Fair, MD  Chart reviewed as part of pre-operative protocol coverage. Given past medical history and time since last visit, based on ACC/AHA guidelines, Jerusalem B Hairr would be at acceptable risk for the planned procedure without further  cardiovascular testing.   Patient was advised that if he develops new symptoms prior to surgery to contact our office to arrange a follow-up appointment.  He verbalized understanding.  Patient with diagnosis of afib on Eliquis for anticoagulation.     Procedure: colonoscopy Date of procedure: TBD   CHA2DS2-VASc Score = 4  This indicates a 4.8% annual risk of stroke. The patient's score is based upon: CHF History: 1 HTN History: 1 Diabetes History: 1 Stroke History: 0 Vascular Disease History: 1 Age Score: 0 Gender Score: 0   CrCl 81mL/min Platelet count 235K   Per office protocol, patient can hold Eliquis for 2 days prior to procedure as requested.  If necessary his Plavix may be held for 5 days prior to his procedure.  Please resume as soon as hemostasis is achieved.  I will route this recommendation to the requesting party via Epic fax function and remove from pre-op pool.       Disposition: Follow-up with Dr. Royann Shivers in 9-12 months.   Thomasene Ripple. Braylei Totino NP-C     04/28/2023, 9:12 AM [facility] [address] Office [phone] Fax [phone]  Notice: This dictation was prepared with Dragon dictation along with smaller phrase technology. Any transcriptional errors that result from this process are unintentional and may not be corrected upon review.  I spent 14 minutes examining this patient, reviewing medications, and using patient centered shared decision making involving her cardiac care.  Prior to her visit I spent greater than 20 minutes reviewing her past medical history,  medications, and prior cardiac tests.

## 2023-04-28 ENCOUNTER — Ambulatory Visit: Payer: Federal, State, Local not specified - PPO | Attending: General Practice | Admitting: General Practice

## 2023-04-28 ENCOUNTER — Encounter: Payer: Self-pay | Admitting: General Practice

## 2023-04-28 VITALS — BP 128/86 | HR 80 | Ht 72.0 in | Wt 223.4 lb

## 2023-04-28 DIAGNOSIS — Z01818 Encounter for other preprocedural examination: Secondary | ICD-10-CM

## 2023-04-28 NOTE — Patient Instructions (Signed)
Medication Instructions:  The current medical regimen is effective;  continue present plan and medications as directed. Please refer to the Current Medication list given to you today.  *If you need a refill on your cardiac medications before your next appointment, please call your pharmacy*  Lab Work: NONE If you have labs (blood work) drawn today and your tests are completely normal, you will receive your results only by:    MyChart Message (if you have MyChart) OR   A paper copy in the mail If you have any lab test that is abnormal or we need to change your treatment, we will call you to review the results.  Other Instructions INCREASE PHYSICAL ACTIVITY-AS TOLERATED PLEASE READ AND FOLLOW ATTACHED  SALTY 6  Follow-Up: At [facility], you and your health needs are our priority.  As part of our continuing mission to provide you with exceptional heart care, we have created designated Provider Care Teams.  These Care Teams include your primary Cardiologist (physician) and Advanced Practice Providers (APPs -  Physician Assistants and Nurse Practitioners) who all work together to provide you with the care you need, when you need it.  Your next appointment:   12 month(s)  Provider:   Thurmon Fair, MD  or Edd Fabian, FNP

## 2023-05-06 NOTE — Telephone Encounter (Signed)
Pt was seen by cardiology on 04/28/23. Please advise. Thank you

## 2023-05-08 NOTE — Telephone Encounter (Signed)
Pt wants to wait until January to schedule his procedure. Will call once we get providers schedule.

## 2023-05-08 NOTE — Telephone Encounter (Signed)
Okay to schedule for colonoscopy, ASA 3, hold Plavix x5 days and Eliquis x2 days. Thank you

## 2023-06-10 MED ORDER — PEG 3350-KCL-NA BICARB-NACL 420 G PO SOLR: 4000.0000 mL | Freq: Once | ORAL | 0 refills | Status: AC

## 2023-06-10 NOTE — Addendum Note (Signed)
Addended by: Armstead Peaks on: 06/10/2023 10:16 AM   Modules accepted: Orders

## 2023-06-10 NOTE — Telephone Encounter (Signed)
Called pt. He has been scheduled for 07/07/23. Patient aware of medications that needs to be held and when. Aware will send rx for prep to the pharmacy and will also send instructions.

## 2023-06-10 NOTE — Telephone Encounter (Signed)
Patient aware of pre-op appt details

## 2023-07-03 ENCOUNTER — Encounter (HOSPITAL_COMMUNITY)
Admission: RE | Admit: 2023-07-03 | Discharge: 2023-07-03 | Disposition: A | Discharge status: Home / Self Care | Payer: Federal, State, Local not specified - PPO | Source: Ambulatory Visit | Attending: Internal Medicine | Admitting: Internal Medicine

## 2023-07-07 ENCOUNTER — Encounter (HOSPITAL_COMMUNITY): Payer: Self-pay | Admitting: Internal Medicine

## 2023-07-07 ENCOUNTER — Encounter (HOSPITAL_COMMUNITY)
Admission: RE | Disposition: A | Discharge status: Home / Self Care | Payer: Self-pay | Source: Home / Self Care | Attending: Internal Medicine

## 2023-07-07 ENCOUNTER — Ambulatory Visit (HOSPITAL_COMMUNITY): Payer: Federal, State, Local not specified - PPO | Admitting: Certified Registered Nurse Anesthetist

## 2023-07-07 ENCOUNTER — Ambulatory Visit (HOSPITAL_COMMUNITY)
Admission: RE | Admit: 2023-07-07 | Discharge: 2023-07-07 | Disposition: A | Discharge status: Home / Self Care | Payer: Federal, State, Local not specified - PPO | Attending: Internal Medicine | Admitting: Internal Medicine

## 2023-07-07 DIAGNOSIS — D122 Benign neoplasm of ascending colon: Secondary | ICD-10-CM | POA: Diagnosis not present

## 2023-07-07 DIAGNOSIS — I110 Hypertensive heart disease with heart failure: Secondary | ICD-10-CM | POA: Diagnosis not present

## 2023-07-07 DIAGNOSIS — E119 Type 2 diabetes mellitus without complications: Secondary | ICD-10-CM | POA: Diagnosis not present

## 2023-07-07 DIAGNOSIS — Z7984 Long term (current) use of oral hypoglycemic drugs: Secondary | ICD-10-CM | POA: Insufficient documentation

## 2023-07-07 DIAGNOSIS — I252 Old myocardial infarction: Secondary | ICD-10-CM | POA: Diagnosis not present

## 2023-07-07 DIAGNOSIS — Z1211 Encounter for screening for malignant neoplasm of colon: Secondary | ICD-10-CM | POA: Diagnosis not present

## 2023-07-07 DIAGNOSIS — I5032 Chronic diastolic (congestive) heart failure: Secondary | ICD-10-CM | POA: Insufficient documentation

## 2023-07-07 DIAGNOSIS — Z87891 Personal history of nicotine dependence: Secondary | ICD-10-CM | POA: Diagnosis not present

## 2023-07-07 DIAGNOSIS — I25119 Atherosclerotic heart disease of native coronary artery with unspecified angina pectoris: Secondary | ICD-10-CM | POA: Insufficient documentation

## 2023-07-07 DIAGNOSIS — D120 Benign neoplasm of cecum: Secondary | ICD-10-CM | POA: Insufficient documentation

## 2023-07-07 DIAGNOSIS — D123 Benign neoplasm of transverse colon: Secondary | ICD-10-CM | POA: Insufficient documentation

## 2023-07-07 DIAGNOSIS — K635 Polyp of colon: Secondary | ICD-10-CM | POA: Diagnosis not present

## 2023-07-07 DIAGNOSIS — I4819 Other persistent atrial fibrillation: Secondary | ICD-10-CM | POA: Diagnosis not present

## 2023-07-07 DIAGNOSIS — I4891 Unspecified atrial fibrillation: Secondary | ICD-10-CM | POA: Diagnosis not present

## 2023-07-07 DIAGNOSIS — K648 Other hemorrhoids: Secondary | ICD-10-CM | POA: Diagnosis not present

## 2023-07-07 HISTORY — PX: COLONOSCOPY WITH PROPOFOL: SHX5780

## 2023-07-07 HISTORY — PX: POLYPECTOMY: SHX5525

## 2023-07-07 LAB — GLUCOSE, CAPILLARY: Glucose-Capillary: 134 mg/dL — ABNORMAL HIGH (ref 70–99)

## 2023-07-07 SURGERY — COLONOSCOPY WITH PROPOFOL
Anesthesia: General

## 2023-07-07 MED ORDER — PROPOFOL 500 MG/50ML IV EMUL
INTRAVENOUS | Status: AC
  Filled 2023-07-07: qty 50

## 2023-07-07 MED ORDER — LIDOCAINE HCL (PF) 2 % IJ SOLN
INTRAMUSCULAR | Status: AC
  Filled 2023-07-07: qty 5

## 2023-07-07 MED ORDER — PHENYLEPHRINE 80 MCG/ML (10ML) SYRINGE FOR IV PUSH (FOR BLOOD PRESSURE SUPPORT)
PREFILLED_SYRINGE | INTRAVENOUS | Status: AC
  Filled 2023-07-07: qty 10

## 2023-07-07 MED ORDER — PHENYLEPHRINE 80 MCG/ML (10ML) SYRINGE FOR IV PUSH (FOR BLOOD PRESSURE SUPPORT)
PREFILLED_SYRINGE | INTRAVENOUS | Status: DC | PRN
  Administered 2023-07-07 (×3): 80 ug via INTRAVENOUS

## 2023-07-07 MED ORDER — LACTATED RINGERS IV SOLN: INTRAVENOUS | Status: DC | PRN

## 2023-07-07 MED ORDER — PROPOFOL 500 MG/50ML IV EMUL
INTRAVENOUS | Status: DC | PRN
  Administered 2023-07-07: 150 ug/kg/min via INTRAVENOUS
  Administered 2023-07-07: 60 mg via INTRAVENOUS

## 2023-07-07 NOTE — Transfer of Care (Signed)
 Immediate Anesthesia Transfer of Care Note  Patient: [name]  Procedure(s) Performed: COLONOSCOPY WITH PROPOFOL  POLYPECTOMY  Patient Location: Short Stay  Anesthesia Type:General  Level of Consciousness: awake, alert , and oriented  Airway & Oxygen Therapy: Patient Spontanous Breathing  Post-op Assessment: Report given to RN, Post -op Vital signs reviewed and stable, Patient moving all extremities X 4, and Patient able to stick tongue midline  Post vital signs: Reviewed and stable  Last Vitals:  Vitals Value Taken Time  BP 108/53 07/07/23 0755  Temp 36.6 C 07/07/23 0755  Pulse 55 07/07/23 0755  Resp 13 07/07/23 0755  SpO2 98 % 07/07/23 0755    Last Pain:  Vitals:   07/07/23 0755  TempSrc: Oral  PainSc: 0-No pain      Patients Stated Pain Goal: 5 (07/07/23 9287)  Complications: No notable events documented.

## 2023-07-07 NOTE — Discharge Instructions (Signed)
  Colonoscopy Discharge Instructions  Read the instructions outlined below and refer to this sheet in the next few weeks. These discharge instructions provide you with general information on caring for yourself after you leave the hospital. Your doctor may also give you specific instructions. While your treatment has been planned according to the most current medical practices available, unavoidable complications occasionally occur.   ACTIVITY You may resume your regular activity, but move at a slower pace for the next 24 hours.  Take frequent rest periods for the next 24 hours.  Walking will help get rid of the air and reduce the bloated feeling in your belly (abdomen).  No driving for 24 hours (because of the medicine (anesthesia) used during the test).   Do not sign any important legal documents or operate any machinery for 24 hours (because of the anesthesia used during the test).  NUTRITION Drink plenty of fluids.  You may resume your normal diet as instructed by your doctor.  Begin with a light meal and progress to your normal diet. Heavy or fried foods are harder to digest and may make you feel sick to your stomach (nauseated).  Avoid alcoholic beverages for 24 hours or as instructed.  MEDICATIONS You may resume your normal medications unless your doctor tells you otherwise.  WHAT YOU CAN EXPECT TODAY Some feelings of bloating in the abdomen.  Passage of more gas than usual.  Spotting of blood in your stool or on the toilet paper.  IF YOU HAD POLYPS REMOVED DURING THE COLONOSCOPY: No aspirin  products for 7 days or as instructed.  No alcohol for 7 days or as instructed.  Eat a soft diet for the next 24 hours.  FINDING OUT THE RESULTS OF YOUR TEST Not all test results are available during your visit. If your test results are not back during the visit, make an appointment with your caregiver to find out the results. Do not assume everything is normal if you have not heard from your  caregiver or the medical facility. It is important for you to follow up on all of your test results.  SEEK IMMEDIATE MEDICAL ATTENTION IF: You have more than a spotting of blood in your stool.  Your belly is swollen (abdominal distention).  You are nauseated or vomiting.  You have a temperature over 101.  You have abdominal pain or discomfort that is severe or gets worse throughout the day.   Your colonoscopy revealed 3 polyp(s) which I removed successfully. Await pathology results, my office will contact you. I recommend repeating colonoscopy in 5 years for surveillance purposes.   Follow up in GI office in 2-3 months  I hope you have a great rest of your week!  Carlin POUR. Cindie, D.O. Gastroenterology and Hepatology [facility]

## 2023-07-07 NOTE — Anesthesia Preprocedure Evaluation (Signed)
 Anesthesia Evaluation  Patient identified by MRN, date of birth, ID band Patient awake    Reviewed: Allergy & Precautions, H&P , NPO status , Patient's Chart, lab work & pertinent test results, reviewed documented beta blocker date and time   Airway Mallampati: II  TM Distance: >3 FB Neck ROM: full    Dental no notable dental hx.    Pulmonary neg pulmonary ROS, former smoker   Pulmonary exam normal breath sounds clear to auscultation       Cardiovascular Exercise Tolerance: Good hypertension, + angina  + CAD, + Past MI and +CHF  + dysrhythmias Atrial Fibrillation  Rhythm:regular Rate:Normal     Neuro/Psych negative neurological ROS  negative psych ROS   GI/Hepatic negative GI ROS, Neg liver ROS,GERD  ,,  Endo/Other  negative endocrine ROSdiabetes    Renal/GU negative Renal ROS  negative genitourinary   Musculoskeletal   Abdominal   Peds  Hematology negative hematology ROS (+) Blood dyscrasia, anemia   Anesthesia Other Findings   Reproductive/Obstetrics negative OB ROS                             Anesthesia Physical Anesthesia Plan  ASA: 3  Anesthesia Plan: General   Post-op Pain Management:    Induction:   PONV Risk Score and Plan: Propofol  infusion  Airway Management Planned:   Additional Equipment:   Intra-op Plan:   Post-operative Plan:   Informed Consent: I have reviewed the patients History and Physical, chart, labs and discussed the procedure including the risks, benefits and alternatives for the proposed anesthesia with the patient or authorized representative who has indicated his/her understanding and acceptance.     Dental Advisory Given  Plan Discussed with: CRNA  Anesthesia Plan Comments:        Anesthesia Quick Evaluation

## 2023-07-07 NOTE — Op Note (Signed)
 [facility] Patient Name: [name] Procedure Date: 07/07/2023 7:07 AM MRN: [id number] Date of Birth: 13-Feb-1961 Attending MD: Carlin POUR. Cindie , [place], [id number] CSN: [id number] Age: 63 Admit Type: Outpatient Procedure:                Colonoscopy Indications:              Screening for colorectal malignant neoplasm Providers:                Carlin POUR. Cindie, DO, Harlene Lips, Daphne Mulch Technician, Technician Referring MD:              Medicines:                See the Anesthesia note for documentation of the                            administered medications Complications:            No immediate complications. Estimated Blood Loss:     Estimated blood loss was minimal. Procedure:                Pre-Anesthesia Assessment:                           - The anesthesia plan was to use monitored                            anesthesia care (MAC).                           After obtaining informed consent, the colonoscope                            was passed under direct vision. Throughout the                            procedure, the patient's blood pressure, pulse, and                            oxygen saturations were monitored continuously. The                            [id number]-HQ190L ([id number]) scope was introduced through                            the anus and advanced to the the cecum, identified                            by appendiceal orifice and ileocecal valve. The                            colonoscopy was performed without difficulty. The                            patient tolerated the procedure well.  The quality                            of the bowel preparation was evaluated using the                            BBPS [facility] Bowel Preparation Scale) with scores                            of: Right Colon = 3, Transverse Colon = 3 and Left                            Colon = 3 (entire mucosa seen well with no residual                             staining, small fragments of stool or opaque                            liquid). The total BBPS score equals 9. Scope In: 7:36:31 AM Scope Out: 7:53:34 AM Scope Withdrawal Time: 0 hours 12 minutes 31 seconds  Total Procedure Duration: 0 hours 17 minutes 3 seconds  Findings:      Non-bleeding internal hemorrhoids were found during endoscopy.      Two sessile polyps were found in the ascending colon and cecum. The       polyps were 4 to 6 mm in size. These polyps were removed with a cold       snare. Resection and retrieval were complete.      A 5 mm polyp was found in the transverse colon. The polyp was sessile.       The polyp was removed with a cold snare. Resection and retrieval were       complete. Impression:               - Non-bleeding internal hemorrhoids.                           - Two 4 to 6 mm polyps in the ascending colon and                            in the cecum, removed with a cold snare. Resected                            and retrieved.                           - One 5 mm polyp in the transverse colon, removed                            with a cold snare. Resected and retrieved. Moderate Sedation:      Per Anesthesia Care Recommendation:           - Patient has a contact number available for                            emergencies. The  signs and symptoms of potential                            delayed complications were discussed with the                            patient. Return to normal activities tomorrow.                            Written discharge instructions were provided to the                            patient.                           - Resume previous diet.                           - Continue present medications.                           - Await pathology results.                           - Repeat colonoscopy in 5 years for surveillance.                           - Return to GI clinic in 3 months. Procedure Code(s):        --- Professional  ---                           [phone], Colonoscopy, flexible; with removal of                            tumor(s), polyp(s), or other lesion(s) by snare                            technique Diagnosis Code(s):        --- Professional ---                           Z12.11, Encounter for screening for malignant                            neoplasm of colon                           D12.2, Benign neoplasm of ascending colon                           D12.0, Benign neoplasm of cecum                           D12.3, Benign neoplasm of transverse colon (hepatic                            flexure or splenic flexure)  K64.8, Other hemorrhoids CPT copyright 2022 American Medical Association. All rights reserved. The codes documented in this report are preliminary and upon coder review may  be revised to meet current compliance requirements. Carlin POUR. Cindie, DO Carlin POUR. Cindie, DO 07/07/2023 7:57:49 AM This report has been signed electronically. Number of Addenda: 0

## 2023-07-07 NOTE — H&P (Signed)
 Primary Care Physician:  Melvenia Manus BRAVO, MD Primary Gastroenterologist:  Dr. Cindie  Pre-Procedure History & Physical: HPI:  [name] is a 63 y.o. male is here for a colonoscopy for colon cancer screening purposes.    Past Medical History:  Diagnosis Date   Abdominal distention    Anemia    Postoperative   Chronic diastolic CHF (congestive heart failure) (HCC)    Coronary artery disease    a. premature - acute MI age 79 s/p BMS to prox LAD. b. s/p DES to RCA, PLA, mLAD in 2007. c. 4V CABG in 2008 with LIMA to LAD, SVG to first diagonal, SVG to first OM, SVG to PDA   Diabetes (HCC)    Edema    H/O gastroesophageal reflux (GERD)    High cholesterol    Hypertension    Myocardial infarction (HCC)    Obesity    Persistent atrial fibrillation (HCC)    a. on Tikosyn , Xarelto .   Pleural effusion    resolved with therapy    Past Surgical History:  Procedure Laterality Date   ABLATION OF DYSRHYTHMIC FOCUS  09/23/2017   ATRIAL FIBRILLATION ABLATION N/A 09/23/2017   Procedure: ATRIAL FIBRILLATION ABLATION;  Surgeon: Kelsie Agent, MD;  Location: [facility] INVASIVE CV LAB;  Service: Cardiovascular;  Laterality: N/A;   BUBBLE STUDY  05/29/2021   Procedure: BUBBLE STUDY;  Surgeon: Mona Vinie BROCKS, MD;  Location: [facility] ENDOSCOPY;  Service: Cardiovascular;;   CARDIAC CATHETERIZATION  2008   CARDIOVERSION N/A 05/29/2021   Procedure: CARDIOVERSION;  Surgeon: Mona Vinie BROCKS, MD;  Location: [facility] ENDOSCOPY;  Service: Cardiovascular;  Laterality: N/A;   CORONARY ANGIOPLASTY  1996   CORONARY ARTERY BYPASS GRAFT  2008   LIMA-LAD, SVG-Dx, SVG-OM1, SVG-PDA   CORONARY ATHERECTOMY N/A 05/16/2021   Procedure: CORONARY ATHERECTOMY;  Surgeon: Dann Candyce RAMAN, MD;  Location: [facility] INVASIVE CV LAB;  Service: Cardiovascular;  Laterality: N/A;   CORONARY STENT INTERVENTION N/A 05/01/2021   Procedure: CORONARY STENT INTERVENTION;  Surgeon: Dann Candyce RAMAN, MD;  Location: [facility] INVASIVE CV LAB;  Service:  Cardiovascular;  Laterality: N/A;   CORONARY STENT INTERVENTION N/A 05/16/2021   Procedure: CORONARY STENT INTERVENTION;  Surgeon: Dann Candyce RAMAN, MD;  Location: [facility] INVASIVE CV LAB;  Service: Cardiovascular;  Laterality: N/A;   CORONARY ULTRASOUND/IVUS N/A 05/16/2021   Procedure: Intravascular Ultrasound/IVUS;  Surgeon: Dann Candyce RAMAN, MD;  Location: [facility] INVASIVE CV LAB;  Service: Cardiovascular;  Laterality: N/A;   LEFT HEART CATH N/A 05/16/2021   Procedure: Left Heart Cath;  Surgeon: Dann Candyce RAMAN, MD;  Location: [facility] INVASIVE CV LAB;  Service: Cardiovascular;  Laterality: N/A;   LEFT HEART CATH AND CORS/GRAFTS ANGIOGRAPHY N/A 09/25/2016   Procedure: Left Heart Cath and Cors/Grafts Angiography;  Surgeon: Candyce RAMAN Dann, MD;  Location: [facility] INVASIVE CV LAB;  Service: Cardiovascular;  Laterality: N/A;   LEFT HEART CATH AND CORS/GRAFTS ANGIOGRAPHY N/A 05/01/2021   Procedure: LEFT HEART CATH AND CORS/GRAFTS ANGIOGRAPHY;  Surgeon: Dann Candyce RAMAN, MD;  Location: [facility] INVASIVE CV LAB;  Service: Cardiovascular;  Laterality: N/A;   TEE WITHOUT CARDIOVERSION N/A 05/29/2021   Procedure: TRANSESOPHAGEAL ECHOCARDIOGRAM (TEE);  Surgeon: Mona Vinie BROCKS, MD;  Location: [facility] ENDOSCOPY;  Service: Cardiovascular;  Laterality: N/A;    Prior to Admission medications   Medication Sig Start Date End Date Taking? Authorizing Provider  atorvastatin  (LIPITOR ) 80 MG tablet Take 1 tablet (80 mg total) by mouth at bedtime. 04/15/23  Yes Melvenia Manus BRAVO, MD  dofetilide  (TIKOSYN ) 500 MCG  capsule TAKE ONE CAPSULE ( TOTAL) BY MOUTHTWO TIMES DAILY appt req for refill [phone] 11/26/22  Yes Fenton, Clint R, PA  furosemide  (LASIX ) 40 MG tablet Take 1 tablet (40 mg total) by mouth 2 (two) times daily as needed for fluid. 04/15/23  Yes Melvenia Manus BRAVO, MD  glimepiride  (AMARYL ) 4 MG tablet Take 4 mg by mouth daily with breakfast.   Yes [provider]  isosorbide  mononitrate (IMDUR ) 30 MG 24  hr tablet Take 1 tablet (30 mg total) by mouth in the morning. 04/15/23  Yes Melvenia Manus BRAVO, MD  losartan  (COZAAR ) 50 MG tablet Take 50 mg by mouth every evening.   Yes [provider]  metFORMIN  (GLUCOPHAGE ) 1000 MG tablet Take 1 tablet (1,000 mg total) by mouth 2 (two) times daily with a meal. Resume on 05/19/21 05/16/21  Yes Duke, Jon Garre, PA  Multiple Vitamins-Minerals (MULTIVITAMIN WITH MINERALS) tablet Take 1 tablet by mouth 2 (two) times daily.    Yes [provider]  Nebivolol  HCl 20 MG TABS Take 1 tablet (20 mg total) by mouth every morning. 04/15/23  Yes Melvenia Manus BRAVO, MD  potassium chloride  SA (KLOR-CON  M) 20 MEQ tablet Take 2 tablets (40 mEq total) by mouth daily. 04/15/23  Yes Melvenia Manus BRAVO, MD  sitaGLIPtin  (JANUVIA ) 100 MG tablet Take 1 tablet (100 mg total) by mouth in the morning. 04/15/23  Yes Melvenia Manus BRAVO, MD  apixaban  (ELIQUIS ) 5 MG TABS tablet Take 1 tablet (5 mg total) by mouth 2 (two) times daily. 03/05/23   Croitoru, Mihai, MD  clopidogrel  (PLAVIX ) 75 MG tablet TAKE ONE TABLET (75 MG TOTAL) BY MOUTH DAILY WITH BREAKFAST. 11/01/22   Cleaver, Josefa HERO, NP  dapagliflozin  propanediol (FARXIGA ) 10 MG TABS tablet Take 1 tablet (10 mg total) by mouth in the morning. 04/15/23   Melvenia Manus BRAVO, MD  glucose blood (ACCU-CHEK AVIVA PLUS) test strip Use as instructed 04/15/23   Melvenia Manus BRAVO, MD  nitroGLYCERIN  (NITROSTAT ) 0.4 MG SL tablet Place 1 tablet (0.4 mg total) under the tongue every 5 (five) minutes as needed for chest pain. 09/26/16   Marylu Leita SAUNDERS, NP    Allergies as of 06/10/2023   (No Known Allergies)    Family History  Problem Relation Age of Onset   CAD Father    Vascular Disease Father        carotid artery disease    Social History   Socioeconomic History   Marital status: Married    Spouse name: Not on file   Number of children: Not on file   Years of education: 12   Highest education level: Not on file  Occupational  History   Occupation: Sports administrator    Employer: [employer]   Occupation: Has cattle and programme researcher, broadcasting/film/video.  Tobacco Use   Smoking status: Former    Current packs/day: 2.00    Average packs/day: 2.0 packs/day for 30.0 years (60.0 ttl pk-yrs)    Types: Cigarettes   Smokeless tobacco: Never   Tobacco comments:    Former smoker 05/29/2021  Vaping Use   Vaping status: Never Used  Substance and Sexual Activity   Alcohol use: Yes    Alcohol/week: 1.0 standard drink of alcohol    Types: 1 Standard drinks or equivalent per week    Comment: rare   Drug use: No   Sexual activity: Yes    Birth control/protection: None  Other Topics Concern   Not on file  Social History Narrative  Not on file   Social Drivers of Health   Financial Resource Strain: Not on file  Food Insecurity: Not on file  Transportation Needs: Not on file  Physical Activity: Not on file  Stress: Not on file  Social Connections: Not on file  Intimate Partner Violence: Not on file    Review of Systems: See HPI, otherwise negative ROS  Physical Exam: Vital signs in last 24 hours: Temp:  [98.2 F (36.8 C)] 98.2 F (36.8 C) (01/06 0712) Pulse Rate:  [61] 61 (01/06 0712) Resp:  [18] 18 (01/06 0712) BP: (121)/(83) 121/83 (01/06 0712) SpO2:  [97 %] 97 % (01/06 0712) Weight:  [101.2 kg] 101.2 kg (01/06 0712)   General:   Alert,  Well-developed, well-nourished, pleasant and cooperative in NAD Head:  Normocephalic and atraumatic. Eyes:  Sclera clear, no icterus.   Conjunctiva pink. Ears:  Normal auditory acuity. Nose:  No deformity, discharge,  or lesions. Msk:  Symmetrical without gross deformities. Normal posture. Extremities:  Without clubbing or edema. Neurologic:  Alert and  oriented x4;  grossly normal neurologically. Skin:  Intact without significant lesions or rashes. Psych:  Alert and cooperative. Normal mood and affect.  Impression/Plan: [name] is here for a colonoscopy to be performed for  colon cancer screening purposes.  The risks of the procedure including infection, bleed, or perforation as well as benefits, limitations, alternatives and imponderables have been reviewed with the patient. Questions have been answered. All parties agreeable.

## 2023-07-08 LAB — SURGICAL PATHOLOGY

## 2023-07-08 NOTE — Anesthesia Postprocedure Evaluation (Signed)
 Anesthesia Post Note  Patient: [name]  Procedure(s) Performed: COLONOSCOPY WITH PROPOFOL  POLYPECTOMY  Patient location during evaluation: Phase II Anesthesia Type: General Level of consciousness: awake Pain management: pain level controlled Vital Signs Assessment: post-procedure vital signs reviewed and stable Respiratory status: spontaneous breathing and respiratory function stable Cardiovascular status: blood pressure returned to baseline and stable Postop Assessment: no headache and no apparent nausea or vomiting Anesthetic complications: no Comments: Late entry   No notable events documented.   Last Vitals:  Vitals:   07/07/23 0712 07/07/23 0755  BP: 121/83 (!) 108/53  Pulse: 61 (!) 55  Resp: 18 13  Temp: 36.8 C 36.6 C  SpO2: 97% 98%    Last Pain:  Vitals:   07/07/23 0755  TempSrc: Oral  PainSc: 0-No pain                 Yvonna JINNY Bosworth

## 2023-07-11 ENCOUNTER — Encounter (HOSPITAL_COMMUNITY): Payer: Self-pay | Admitting: Internal Medicine

## 2023-09-02 ENCOUNTER — Ambulatory Visit (HOSPITAL_COMMUNITY): Payer: Federal, State, Local not specified - PPO | Admitting: Physician Assistant

## 2023-09-08 ENCOUNTER — Encounter (HOSPITAL_COMMUNITY): Payer: Self-pay | Admitting: Physician Assistant

## 2023-09-08 ENCOUNTER — Ambulatory Visit (HOSPITAL_COMMUNITY)
Admission: RE | Admit: 2023-09-08 | Discharge: 2023-09-08 | Disposition: A | Discharge status: Home / Self Care | Payer: Self-pay | Source: Ambulatory Visit | Attending: Physician Assistant | Admitting: Physician Assistant

## 2023-09-08 VITALS — BP 138/88 | HR 76 | Ht 72.0 in | Wt 232.8 lb

## 2023-09-08 DIAGNOSIS — Z79899 Other long term (current) drug therapy: Secondary | ICD-10-CM | POA: Diagnosis not present

## 2023-09-08 DIAGNOSIS — E785 Hyperlipidemia, unspecified: Secondary | ICD-10-CM | POA: Diagnosis not present

## 2023-09-08 DIAGNOSIS — Z7901 Long term (current) use of anticoagulants: Secondary | ICD-10-CM | POA: Diagnosis not present

## 2023-09-08 DIAGNOSIS — I110 Hypertensive heart disease with heart failure: Secondary | ICD-10-CM | POA: Diagnosis not present

## 2023-09-08 DIAGNOSIS — Z7984 Long term (current) use of oral hypoglycemic drugs: Secondary | ICD-10-CM | POA: Insufficient documentation

## 2023-09-08 DIAGNOSIS — Z5181 Encounter for therapeutic drug level monitoring: Secondary | ICD-10-CM

## 2023-09-08 DIAGNOSIS — Z955 Presence of coronary angioplasty implant and graft: Secondary | ICD-10-CM | POA: Insufficient documentation

## 2023-09-08 DIAGNOSIS — I2510 Atherosclerotic heart disease of native coronary artery without angina pectoris: Secondary | ICD-10-CM | POA: Insufficient documentation

## 2023-09-08 DIAGNOSIS — I4819 Other persistent atrial fibrillation: Secondary | ICD-10-CM

## 2023-09-08 DIAGNOSIS — D6869 Other thrombophilia: Secondary | ICD-10-CM

## 2023-09-08 DIAGNOSIS — I5032 Chronic diastolic (congestive) heart failure: Secondary | ICD-10-CM | POA: Diagnosis not present

## 2023-09-08 DIAGNOSIS — E119 Type 2 diabetes mellitus without complications: Secondary | ICD-10-CM | POA: Insufficient documentation

## 2023-09-08 LAB — BASIC METABOLIC PANEL
Anion gap: 15 (ref 5–15)
BUN: 20 mg/dL (ref 8–23)
CO2: 20 mmol/L — ABNORMAL LOW (ref 22–32)
Calcium: 10 mg/dL (ref 8.9–10.3)
Chloride: 104 mmol/L (ref 98–111)
Creatinine, Ser: 0.84 mg/dL (ref 0.61–1.24)
GFR, Estimated: >60 mL/min (ref >60)
Glucose, Bld: 166 mg/dL — ABNORMAL HIGH (ref 70–99)
Potassium: 4.4 mmol/L (ref 3.5–5.1)
Sodium: 139 mmol/L (ref 135–145)

## 2023-09-08 LAB — MAGNESIUM: Magnesium: 2.1 mg/dL (ref 1.7–2.4)

## 2023-09-08 NOTE — Progress Notes (Signed)
 Primary Care Physician: Billie Lade, MD Primary Cardiologist: Dr Royann Shivers  Primary Electrophysiologist: none Referring Physician: Dr Oletta Darter is a 63 y.o. male with a history of HTN, CAD, chronic systolic CHF, DM, HLD, atrial fibrillation who presents for follow up in the [facility]. Patient is on Eliquis for a CHADS2VASC score of 4. He was seen by Dr Johney Frame 05/07/21 and had been in afib persistently for months. Echo showed new reduced EF at 30%. He also had PCI 05/16/21. Because of holding anticoagulation for LHC, ablation for afib was not pursued. Dofetilide was recommended with TEE DCCV prior. S/p dofetilide loading S/p dofetilide loading 11/29-12/2/22.  Patient returns for follow up for atrial fibrillation and dofetilide monitoring. Patient reports that he has done well since his last visit with no interim symptoms of afib. He will have occasional nose bleeds but not frequently.   Today, he denies symptoms of palpitations, chest pain, shortness of breath, orthopnea, PND, lower extremity edema, dizziness, presyncope, syncope, snoring, daytime somnolence, or neurologic sequela. The patient is tolerating medications without difficulties and is otherwise without complaint today.    Atrial Fibrillation Risk Factors:  he does not have symptoms or diagnosis of sleep apnea. he does not have a history of rheumatic fever. he does not have a history of alcohol use.   Atrial Fibrillation Management history:  Previous antiarrhythmic drugs: dofetilide  Previous cardioversions: 05/29/21 Previous ablations: 2019 Anticoagulation history: Eliquis   Past Medical History:  Diagnosis Date   Abdominal distention    Anemia    Postoperative   Chronic diastolic CHF (congestive heart failure) (HCC)    Coronary artery disease    a. premature - acute MI age 46 s/p BMS to prox LAD. b. s/p DES to RCA, PLA, mLAD in 2007. c. 4V CABG in 2008 with LIMA to LAD,  SVG to first diagonal, SVG to first OM, SVG to PDA   Diabetes (HCC)    Edema    H/O gastroesophageal reflux (GERD)    High cholesterol    Hypertension    Myocardial infarction (HCC)    Obesity    Persistent atrial fibrillation (HCC)    a. on Tikosyn, Xarelto.   Pleural effusion    resolved with therapy    Current Outpatient Medications  Medication Sig Dispense Refill   apixaban (ELIQUIS) 5 MG TABS tablet Take 1 tablet (5 mg total) by mouth 2 (two) times daily. 60 tablet 11   atorvastatin (LIPITOR) 80 MG tablet Take 1 tablet (80 mg total) by mouth at bedtime. 90 tablet 3   dapagliflozin propanediol (FARXIGA) 10 MG TABS tablet Take 1 tablet (10 mg total) by mouth in the morning. 90 tablet 3   dofetilide (TIKOSYN) 500 MCG capsule TAKE ONE CAPSULE ( TOTAL) BY MOUTHTWO TIMES DAILY appt req for refill [phone] 60 capsule 0   furosemide (LASIX) 40 MG tablet Take 1 tablet (40 mg total) by mouth 2 (two) times daily as needed for fluid. 60 tablet 1   glimepiride (AMARYL) 4 MG tablet Take 4 mg by mouth daily with breakfast.     glucose blood (ACCU-CHEK AVIVA PLUS) test strip Use as instructed 100 each 12   isosorbide mononitrate (IMDUR) 30 MG 24 hr tablet Take 1 tablet (30 mg total) by mouth in the morning. 90 tablet 3   losartan (COZAAR) 50 MG tablet Take 50 mg by mouth every evening.     metFORMIN (GLUCOPHAGE) 1000 MG tablet Take 1  tablet (1,000 mg total) by mouth 2 (two) times daily with a meal. Resume on 05/19/21     Multiple Vitamins-Minerals (MULTIVITAMIN WITH MINERALS) tablet Take 1 tablet by mouth 2 (two) times daily.      Nebivolol HCl 20 MG TABS Take 1 tablet (20 mg total) by mouth every morning. 90 tablet 3   nitroGLYCERIN (NITROSTAT) 0.4 MG SL tablet Place 1 tablet (0.4 mg total) under the tongue every 5 (five) minutes as needed for chest pain. 25 tablet 4   potassium chloride SA (KLOR-CON M) 20 MEQ tablet Take 2 tablets (40 mEq total) by mouth daily. 90 tablet 3   sitaGLIPtin  (JANUVIA) 100 MG tablet Take 1 tablet (100 mg total) by mouth in the morning. 90 tablet 3   No current facility-administered medications for this encounter.    ROS- All systems are reviewed and negative except as per the HPI above.  Physical Exam: Vitals:   09/08/23 0959  BP: 138/88  Pulse: 76  Weight: 105.6 kg  Height: 6' (1.829 m)    GEN: Well nourished, well developed in no acute distress CARDIAC: Regular rate and rhythm, no murmurs, rubs, gallops RESPIRATORY:  Clear to auscultation without rales, wheezing or rhonchi  ABDOMEN: Soft, non-tender, non-distended EXTREMITIES:  No edema; No deformity    Wt Readings from Last 3 Encounters:  09/08/23 105.6 kg  07/07/23 101.2 kg  04/28/23 101.3 kg    EKG today demonstrates  SR, PVC Vent. rate 76 BPM PR interval 180 ms QRS duration 82 ms QT/QTcB 368/414 ms   Echo 04/16/21 demonstrated   1. Left ventricular ejection fraction, by estimation, is 30 to 35%. The  left ventricle has moderately decreased function. The left ventricle  demonstrates global hypokinesis. There is moderate asymmetric left  ventricular hypertrophy of the posterior-lateral segment. Left ventricular diastolic parameters are indeterminate.   2. Right ventricular systolic function is moderately reduced. The right  ventricular size is normal.   3. Left atrial size was mild to moderately dilated.   4. Right atrial size was mildly dilated.   5. The mitral valve is grossly normal. Trivial mitral valve  regurgitation. No evidence of mitral stenosis.   6. The aortic valve is abnormal. There is moderate calcification of the  aortic valve. Aortic valve regurgitation is trivial. Mild to moderate  aortic valve sclerosis/calcification is present, without any evidence of  aortic stenosis.   7. The inferior vena cava is normal in size with <50% respiratory  variability, suggesting right atrial pressure of 8 mmHg.   Epic records are reviewed at length  today  CHA2DS2-VASc Score = 4  The patient's score is based upon: CHF History: 1 HTN History: 1 Diabetes History: 1 Stroke History: 0 Vascular Disease History: 1 Age Score: 0 Gender Score: 0       ASSESSMENT AND PLAN: Persistent Atrial Fibrillation (ICD10:  I48.19) The patient's CHA2DS2-VASc score is 4, indicating a 4.8% annual risk of stroke.   S/p afib ablation 2019 S/p dofetilide loading 05/2021 Patient appears to be maintaining SR Continue dofetilide 500 mcg BID Continue Eliquis 5 mg BID  Secondary Hypercoagulable State (ICD10:  D68.69) The patient is at significant risk for stroke/thromboembolism based upon his CHA2DS2-VASc Score of 4.  Continue Apixaban (Eliquis). Discussed referral to ENT for nosebleeds, patient deferred for now.   High Risk Medication Monitoring (ICD 10: Z79.899) QT interval on ECG acceptable for dofetilide monitoring. Check bmet/mag today.      HTN Stable on current regimen  CAD  No anginal symptoms Followed by Dr Royann Shivers   HFrecEF EF normalized on TEE 2022 GDMT per primary cardiology team Fluid status appears stable today   Follow up in the AF clinic in 6 months.    Jorja Loa [name] [facility] [address] [place], [place] [address] [phone]

## 2023-09-26 ENCOUNTER — Encounter: Payer: Self-pay | Admitting: Gastroenterology

## 2023-10-13 ENCOUNTER — Encounter: Payer: Self-pay | Admitting: Internal Medicine

## 2023-10-13 ENCOUNTER — Ambulatory Visit: Payer: Federal, State, Local not specified - PPO | Admitting: Internal Medicine

## 2023-10-13 VITALS — BP 151/82 | HR 79 | Ht 72.0 in | Wt 230.0 lb

## 2023-10-13 DIAGNOSIS — I10 Essential (primary) hypertension: Secondary | ICD-10-CM

## 2023-10-13 DIAGNOSIS — E669 Obesity, unspecified: Secondary | ICD-10-CM

## 2023-10-13 DIAGNOSIS — E785 Hyperlipidemia, unspecified: Secondary | ICD-10-CM

## 2023-10-13 DIAGNOSIS — E1159 Type 2 diabetes mellitus with other circulatory complications: Secondary | ICD-10-CM

## 2023-10-13 DIAGNOSIS — E119 Type 2 diabetes mellitus without complications: Secondary | ICD-10-CM | POA: Diagnosis not present

## 2023-10-13 DIAGNOSIS — L304 Erythema intertrigo: Secondary | ICD-10-CM

## 2023-10-13 DIAGNOSIS — I4819 Other persistent atrial fibrillation: Secondary | ICD-10-CM

## 2023-10-13 DIAGNOSIS — Z7984 Long term (current) use of oral hypoglycemic drugs: Secondary | ICD-10-CM

## 2023-10-13 DIAGNOSIS — S90111A Contusion of right great toe without damage to nail, initial encounter: Secondary | ICD-10-CM

## 2023-10-13 DIAGNOSIS — R21 Rash and other nonspecific skin eruption: Secondary | ICD-10-CM

## 2023-10-13 DIAGNOSIS — B369 Superficial mycosis, unspecified: Secondary | ICD-10-CM

## 2023-10-13 DIAGNOSIS — M2550 Pain in unspecified joint: Secondary | ICD-10-CM | POA: Insufficient documentation

## 2023-10-13 DIAGNOSIS — R52 Pain, unspecified: Secondary | ICD-10-CM

## 2023-10-13 MED ORDER — CLOTRIMAZOLE-BETAMETHASONE 1-0.05 % EX CREA: 1.0000 | TOPICAL_CREAM | Freq: Every day | CUTANEOUS | 0 refills | Status: AC

## 2023-10-13 NOTE — Patient Instructions (Signed)
 It was a pleasure to see you today.  Thank you for giving us  the opportunity to be involved in your care.  Below is a brief recap of your visit and next steps.  We will plan to see you again in 6 months.  Summary Lotrisone cream prescribed today for axilla rash Repeat labs ordered Follow up in 6 months

## 2023-10-13 NOTE — Progress Notes (Signed)
 Established Patient Office Visit  Subjective   Patient ID: [name], male    DOB: Aug 09, 1960  Age: 63 y.o. MRN: [id number]  Chief Complaint  Patient presents with   Care Management    Six month follow up    Arthritis    Patient feels he has arthritis threw out his body    Toe Injury    Since injuring the toe nail, patient has a black spot on his toe    [name] returns for a 6 month follow up. Last seen on 10/15 to establish care. Since his last visit, he has undergone colonoscopy for colon cancer screening. He also reports that his diarrhea has improved since that time. His concerns today are migratory arthralgias and rashes. He has experienced both conditions in the past, but they have  recurred within the past few months. He is not sure if they are related, but they tend to present together, especially as the weather warms. Today he reports pain around his radiocarpal joint that worsens with extension of the wrist. He also endorses hip and knee pain. There is a rash present in the left axilla that has previously presented on the left forearm with pruritus. He also in concerned about a dark patch on his right hallux that appeared after an impact that resulted in the loss of the nail.   Past Medical History:  Diagnosis Date   Abdominal distention    Anemia    Postoperative   Chronic diastolic CHF (congestive heart failure) (HCC)    Coronary artery disease    a. premature - acute MI age 20 s/p BMS to prox LAD. b. s/p DES to RCA, PLA, mLAD in 2007. c. 4V CABG in 2008 with LIMA to LAD, SVG to first diagonal, SVG to first OM, SVG to PDA   Diabetes (HCC)    Edema    H/O gastroesophageal reflux (GERD)    High cholesterol    Hypertension    Myocardial infarction (HCC)    Obesity    Persistent atrial fibrillation (HCC)    a. on Tikosyn, Xarelto.   Pleural effusion    resolved with therapy   Past Surgical History:  Procedure Laterality Date   ABLATION OF DYSRHYTHMIC FOCUS   09/23/2017   ATRIAL FIBRILLATION ABLATION N/A 09/23/2017   Procedure: ATRIAL FIBRILLATION ABLATION;  Surgeon: Hillis Range, MD;  Location: [facility] INVASIVE CV LAB;  Service: Cardiovascular;  Laterality: N/A;   BUBBLE STUDY  05/29/2021   Procedure: BUBBLE STUDY;  Surgeon: [name], MD;  Location: [facility] ENDOSCOPY;  Service: Cardiovascular;;   CARDIAC CATHETERIZATION  2008   CARDIOVERSION N/A 05/29/2021   Procedure: CARDIOVERSION;  Surgeon: [name], MD;  Location: [facility] ENDOSCOPY;  Service: Cardiovascular;  Laterality: N/A;   COLONOSCOPY WITH PROPOFOL N/A 07/07/2023   Procedure: COLONOSCOPY WITH PROPOFOL;  Surgeon: Lanelle Bal, DO;  Location: [facility] ENDO SUITE;  Service: Endoscopy;  Laterality: N/A;  830am, asa 3   CORONARY ANGIOPLASTY  1996   CORONARY ARTERY BYPASS GRAFT  2008   LIMA-LAD, SVG-Dx, SVG-OM1, SVG-PDA   CORONARY ATHERECTOMY N/A 05/16/2021   Procedure: CORONARY ATHERECTOMY;  Surgeon: Corky Crafts, MD;  Location: [facility] INVASIVE CV LAB;  Service: Cardiovascular;  Laterality: N/A;   CORONARY STENT INTERVENTION N/A 05/01/2021   Procedure: CORONARY STENT INTERVENTION;  Surgeon: Corky Crafts, MD;  Location: [facility] INVASIVE CV LAB;  Service: Cardiovascular;  Laterality: N/A;   CORONARY STENT INTERVENTION N/A 05/16/2021   Procedure: CORONARY STENT  INTERVENTION;  Surgeon: Lucendia Rusk, MD;  Location: [facility] INVASIVE CV LAB;  Service: Cardiovascular;  Laterality: N/A;   CORONARY ULTRASOUND/IVUS N/A 05/16/2021   Procedure: Intravascular Ultrasound/IVUS;  Surgeon: Lucendia Rusk, MD;  Location: [facility] INVASIVE CV LAB;  Service: Cardiovascular;  Laterality: N/A;   LEFT HEART CATH N/A 05/16/2021   Procedure: Left Heart Cath;  Surgeon: Lucendia Rusk, MD;  Location: [facility] INVASIVE CV LAB;  Service: Cardiovascular;  Laterality: N/A;   LEFT HEART CATH AND CORS/GRAFTS ANGIOGRAPHY N/A 09/25/2016   Procedure: Left Heart Cath and Cors/Grafts Angiography;  Surgeon: Lucendia Rusk, MD;  Location: [facility] INVASIVE CV LAB;  Service: Cardiovascular;  Laterality: N/A;   LEFT HEART CATH AND CORS/GRAFTS ANGIOGRAPHY N/A 05/01/2021   Procedure: LEFT HEART CATH AND CORS/GRAFTS ANGIOGRAPHY;  Surgeon: Lucendia Rusk, MD;  Location: [facility] INVASIVE CV LAB;  Service: Cardiovascular;  Laterality: N/A;   POLYPECTOMY  07/07/2023   Procedure: POLYPECTOMY;  Surgeon: Vinetta Greening, DO;  Location: [facility] ENDO SUITE;  Service: Endoscopy;;   TEE WITHOUT CARDIOVERSION N/A 05/29/2021   Procedure: TRANSESOPHAGEAL ECHOCARDIOGRAM (TEE);  Surgeon: Hazle Lites, MD;  Location: [facility] ENDOSCOPY;  Service: Cardiovascular;  Laterality: N/A;   Social History   Tobacco Use   Smoking status: Former    Current packs/day: 2.00    Average packs/day: 2.0 packs/day for 30.0 years (60.0 ttl pk-yrs)    Types: Cigarettes   Smokeless tobacco: Never   Tobacco comments:    Former smoker 05/29/2021  Vaping Use   Vaping status: Never Used  Substance Use Topics   Alcohol use: Yes    Alcohol/week: 1.0 standard drink of alcohol    Types: 1 Standard drinks or equivalent per week    Comment: rare   Drug use: No   Family History  Problem Relation Age of Onset   CAD Father    Vascular Disease Father        carotid artery disease   No Known Allergies  Review of Systems  Constitutional:  Negative for chills and fever.  Respiratory:  Negative for shortness of breath.   Cardiovascular:  Negative for chest pain.  Musculoskeletal:  Positive for arthritis, joint pain and myalgias.  Skin:  Positive for rash.     Objective:     BP (!) 151/82   Pulse 79   Ht 6' (1.829 m)   Wt 230 lb (104.3 kg)   SpO2 95%   BMI 31.19 kg/m  BP Readings from Last 3 Encounters:  10/13/23 (!) 151/82  09/08/23 138/88  07/07/23 (!) 108/53   Physical Exam Constitutional:      Appearance: Normal appearance. He is obese.  HENT:     Mouth/Throat:     Mouth: Mucous membranes are moist.     Pharynx: Oropharynx is clear.   Cardiovascular:     Rate and Rhythm: Normal rate and regular rhythm.  Pulmonary:     Effort: Pulmonary effort is normal.     Breath sounds: Normal breath sounds.  Abdominal:     General: Bowel sounds are normal.  Musculoskeletal:        General: Normal range of motion.     Comments: Positive Finkelstein's test of right wrist  Skin:    General: Skin is warm and dry.     Findings: Rash (well-demarcated,  erythematous rash in left axilla) present.  Neurological:     General: No focal deficit present.     Mental Status: He is alert and oriented to  person, place, and time.  Psychiatric:        Mood and Affect: Mood normal.        Behavior: Behavior normal.   Last CBC Lab Results  Component Value Date   WBC 13.0 (H) 04/15/2023   HGB 13.9 04/15/2023   HCT 42.8 04/15/2023   MCV 90 04/15/2023   MCH 29.3 04/15/2023   RDW 14.2 04/15/2023   PLT 235 04/15/2023   Last metabolic panel Lab Results  Component Value Date   GLUCOSE 166 (H) 09/08/2023   NA 139 09/08/2023   K 4.4 09/08/2023   CL 104 09/08/2023   CO2 20 (L) 09/08/2023   BUN 20 09/08/2023   CREATININE 0.84 09/08/2023   GFRNONAA >60 09/08/2023   CALCIUM 10.0 09/08/2023   PROT 7.1 04/15/2023   ALBUMIN 4.6 04/15/2023   LABGLOB 2.5 04/15/2023   BILITOT 0.4 04/15/2023   ALKPHOS 38 (L) 04/15/2023   AST 17 04/15/2023   ALT 25 04/15/2023   ANIONGAP 15 09/08/2023   Last lipids Lab Results  Component Value Date   CHOL 82 (L) 04/15/2023   HDL 27 (L) 04/15/2023   LDLCALC 40 04/15/2023   TRIG 69 04/15/2023   CHOLHDL 3.0 04/15/2023   Last hemoglobin A1c Lab Results  Component Value Date   HGBA1C 7.3 (H) 04/15/2023   Last thyroid functions Lab Results  Component Value Date   TSH 0.481 04/15/2023   Last vitamin D Lab Results  Component Value Date   VD25OH 44.4 04/15/2023   Last vitamin B12 and Folate Lab Results  Component Value Date   VITAMINB12 320 04/15/2023   FOLATE 19.0 04/15/2023     Assessment &  Plan:   Problem List Items Addressed This Visit       Persistent atrial fibrillation (HCC) (Chronic)   Regular rate and rhythm detected on exam again today. Recently seen in A-fib clinic for follow up. Currently prescribed Eliquis, Tikosyn, and nebivolol. No medication changes are indicated today.       Essential hypertension - Primary (Chronic)   BP was mildly elevated today. He reports home blood pressures consistently around 110's/70's. No medication changes necessary today.      Non-insulin dependent type 2 diabetes mellitus (HCC) (Chronic)   A1c 7.3 in October. He is currently prescribed metformin 1000 mg twice daily, glimepiride 4 mg twice daily, Januvia 100 mg daily, and Farxiga 10 mg daily. No medication changes necessary today. Recheck A1c.       Intertrigo   There is a rash present in the left axilla that is well demarcated and erythematous. It is itching in nature and occurs in warmer months. This is a recurring rash that appears most consistent with fungal origin. Treatment options reviewed. Will treat current rash with Lotrisone cream. Routine preventative hygiene measures reviewed.       Arthralgia of multiple sites   Reports arthralgic pain in right wrist and left hip. Today, pain occurs on the radial aspect of his right wrist, worse with radial flexion. Likely flexor tenosynovitis. However, co-occurrence with rashes concern for possible autoimmune etiology. Recommend analgesic cream. Order ANA, ESR, and CRP to screen for underlying autoimmune disorder.       Hematoma of right great toe   Dark patch on the end of right great toe after injury is likely a small hematoma. Recommended observation. Return if problem does not improve or gets worse.      Return in about 6 months (around 04/13/2024).   Billie Lade,  MD

## 2023-10-13 NOTE — Assessment & Plan Note (Addendum)
 Reports arthralgic pain in right wrist and left hip. Today, pain occurs on the radial aspect of his right wrist, worse with radial flexion. Likely flexor tenosynovitis. However, co-occurrence with rashes concern for possible autoimmune etiology. Recommend analgesic cream. Order ANA, ESR, and CRP to screen for underlying autoimmune disorder.

## 2023-10-13 NOTE — Assessment & Plan Note (Signed)
 BP was mildly elevated today. He reports home blood pressures consistently around 110's/70's. No medication changes necessary today.

## 2023-10-13 NOTE — Assessment & Plan Note (Signed)
 Dark patch on the end of right great toe after injury is likely a small hematoma. Recommended observation. Return if problem does not improve or gets worse.

## 2023-10-13 NOTE — Assessment & Plan Note (Addendum)
 Regular rate and rhythm detected on exam again today. Recently seen in A-fib clinic for follow up. Currently prescribed Eliquis, Tikosyn, and nebivolol. No medication changes are indicated today.

## 2023-10-13 NOTE — Assessment & Plan Note (Signed)
 A1c 7.3 in October. He is currently prescribed metformin 1000 mg twice daily, glimepiride 4 mg twice daily, Januvia 100 mg daily, and Farxiga 10 mg daily. No medication changes necessary today. Recheck A1c.

## 2023-10-13 NOTE — Assessment & Plan Note (Addendum)
 There is a rash present in the left axilla that is well demarcated and erythematous. It is itching in nature and occurs in warmer months. This is a recurring rash that appears most consistent with fungal origin. Treatment options reviewed. Will treat current rash with Lotrisone cream. Routine preventative hygiene measures reviewed.

## 2023-10-13 NOTE — Assessment & Plan Note (Signed)
 Denies recent chest pain. He is followed by cardiology. Currently prescribed atorvastatin, Imdur, and nebivolol. Was taken off of Plavix recently at a-fib clinic before colonoscopy. No medication changes are indicated today.

## 2023-10-14 ENCOUNTER — Encounter: Payer: Self-pay | Admitting: Internal Medicine

## 2023-10-14 LAB — CMP14+EGFR
ALT: 26 IU/L (ref 0–44)
AST: 18 IU/L (ref 0–40)
Albumin: 4.5 g/dL (ref 3.9–4.9)
Alkaline Phosphatase: 43 IU/L — ABNORMAL LOW (ref 44–121)
BUN/Creatinine Ratio: 20 (ref 10–24)
BUN: 18 mg/dL (ref 8–27)
Bilirubin Total: 0.4 mg/dL (ref 0.0–1.2)
CO2: 21 mmol/L (ref 20–29)
Calcium: 9.7 mg/dL (ref 8.6–10.2)
Chloride: 101 mmol/L (ref 96–106)
Creatinine, Ser: 0.88 mg/dL (ref 0.76–1.27)
Globulin, Total: 2.6 g/dL (ref 1.5–4.5)
Glucose: 154 mg/dL — ABNORMAL HIGH (ref 70–99)
Potassium: 4.5 mmol/L (ref 3.5–5.2)
Sodium: 137 mmol/L (ref 134–144)
Total Protein: 7.1 g/dL (ref 6.0–8.5)
eGFR: 98 mL/min/{1.73_m2} (ref >59)

## 2023-10-14 LAB — LIPID PANEL
Chol/HDL Ratio: 3.4 ratio (ref 0.0–5.0)
Cholesterol, Total: 84 mg/dL — ABNORMAL LOW (ref 100–199)
HDL: 25 mg/dL — ABNORMAL LOW (ref >39)
LDL Chol Calc (NIH): 45 mg/dL (ref 0–99)
Triglycerides: 60 mg/dL (ref 0–149)
VLDL Cholesterol Cal: 14 mg/dL (ref 5–40)

## 2023-10-14 LAB — CBC WITH DIFFERENTIAL/PLATELET
Basophils Absolute: 0.1 10*3/uL (ref 0.0–0.2)
Basos: 1 %
EOS (ABSOLUTE): 0.1 10*3/uL (ref 0.0–0.4)
Eos: 2 %
Hematocrit: 44.1 % (ref 37.5–51.0)
Hemoglobin: 14.3 g/dL (ref 13.0–17.7)
Immature Grans (Abs): 0 10*3/uL (ref 0.0–0.1)
Immature Granulocytes: 0 %
Lymphocytes Absolute: 2.2 10*3/uL (ref 0.7–3.1)
Lymphs: 25 %
MCH: 29.4 pg (ref 26.6–33.0)
MCHC: 32.4 g/dL (ref 31.5–35.7)
MCV: 91 fL (ref 79–97)
Monocytes Absolute: 0.7 10*3/uL (ref 0.1–0.9)
Monocytes: 8 %
Neutrophils Absolute: 5.8 10*3/uL (ref 1.4–7.0)
Neutrophils: 64 %
Platelets: 222 10*3/uL (ref 150–450)
RBC: 4.87 x10E6/uL (ref 4.14–5.80)
RDW: 13.8 % (ref 11.6–15.4)
WBC: 9 10*3/uL (ref 3.4–10.8)

## 2023-10-14 LAB — VITAMIN D 25 HYDROXY (VIT D DEFICIENCY, FRACTURES): Vit D, 25-Hydroxy: 44.4 ng/mL (ref 30.0–100.0)

## 2023-10-14 LAB — SEDIMENTATION RATE: Sed Rate: 3 mm/h (ref 0–30)

## 2023-10-14 LAB — C-REACTIVE PROTEIN: CRP: <1 mg/L (ref 0–10)

## 2023-10-14 LAB — B12 AND FOLATE PANEL
Folate: 9.7 ng/mL (ref >3.0)
Vitamin B-12: 241 pg/mL (ref 232–1245)

## 2023-10-14 LAB — HEMOGLOBIN A1C
Est. average glucose Bld gHb Est-mCnc: 171 mg/dL
Hgb A1c MFr Bld: 7.6 % — ABNORMAL HIGH (ref 4.8–5.6)

## 2023-10-14 LAB — TSH+FREE T4
Free T4: 1.45 ng/dL (ref 0.82–1.77)
TSH: 0.583 u[IU]/mL (ref 0.450–4.500)

## 2023-10-14 LAB — ANA W/REFLEX: Anti Nuclear Antibody (ANA): NEGATIVE

## 2023-11-10 ENCOUNTER — Other Ambulatory Visit: Payer: Self-pay | Admitting: Internal Medicine

## 2023-11-10 MED ORDER — METFORMIN HCL 1000 MG PO TABS: 1000.0000 mg | ORAL_TABLET | Freq: Two times a day (BID) | ORAL | Status: DC

## 2023-11-10 MED ORDER — LOSARTAN POTASSIUM 50 MG PO TABS: 50.0000 mg | ORAL_TABLET | Freq: Every evening | ORAL | 0 refills | Status: DC

## 2023-11-10 NOTE — Telephone Encounter (Signed)
 Copied from CRM [id number]. Topic: Clinical - Medication Refill >> Nov 10, 2023  1:41 PM Sophia H wrote: Medication: losartan  (COZAAR ) 50 MG tablet metFORMIN  (GLUCOPHAGE ) 1000 MG tablet  Has the patient contacted their pharmacy? Yes (Agent: If no, request that the patient contact the pharmacy for the refill. If patient does not wish to contact the pharmacy document the reason why and proceed with request.) (Agent: If yes, when and what did the pharmacy advise?)  This is the patient's preferred pharmacy:  [facility] - [place], [place] - [address] [place] [address] Phone: [phone] Fax: [phone]  Is this the correct pharmacy for this prescription? Yes If no, delete pharmacy and type the correct one.   Has the prescription been filled recently? Yes  Is the patient out of the medication? Yes  Has the patient been seen for an appointment in the last year OR does the patient have an upcoming appointment? Yes  Can we respond through MyChart? Yes  Agent: Please be advised that Rx refills may take up to 3 business days. We ask that you follow-up with your pharmacy.   **Pt has been out for 2 weeks now, needing filled asap, states pharmacy said they have reached out 2 times with no response. Please advise pt once done # [phone].

## 2023-11-10 NOTE — Telephone Encounter (Signed)
 Last Fill: Losartan : Unk, Historical Provider     Metformin : 05/16/21  Last OV: 10/13/23 Next OV: 04/19/24  Routing to provider for review/authorization.

## 2023-11-10 NOTE — Telephone Encounter (Unsigned)
 Copied from CRM [id number]. Topic: Clinical - Medication Refill >> Nov 10, 2023  1:37 PM Sophia H wrote: Medication: losartan  (COZAAR ) 50 MG tablet metFORMIN  (GLUCOPHAGE ) 1000 MG tablet  Has the patient contacted their pharmacy? Yes (Agent: If no, request that the patient contact the pharmacy for the refill. If patient does not wish to contact the pharmacy document the reason why and proceed with request.) (Agent: If yes, when and what did the pharmacy advise?)  This is the patient's preferred pharmacy:  [facility] - [place], [place] - [address] [place] [address] Phone: [phone] Fax: [phone]    Is this the correct pharmacy for this prescription? Yes If no, delete pharmacy and type the correct one.   Has the prescription been filled recently? Yes  Is the patient out of the medication? Yes  Has the patient been seen for an appointment in the last year OR does the patient have an upcoming appointment? Yes  Can we respond through MyChart? Yes  Agent: Please be advised that Rx refills may take up to 3 business days. We ask that you follow-up with your pharmacy.  **Pt needing enough fills to get him through till Dr. Kermit Ped is back in office, has been out for the last 2 weeks and needs filled ASAP.

## 2023-11-11 ENCOUNTER — Other Ambulatory Visit: Payer: Self-pay

## 2023-11-11 MED ORDER — METFORMIN HCL 1000 MG PO TABS: 1000.0000 mg | ORAL_TABLET | Freq: Two times a day (BID) | ORAL | 0 refills | Status: DC

## 2023-11-14 ENCOUNTER — Telehealth: Payer: Self-pay

## 2023-11-14 ENCOUNTER — Other Ambulatory Visit: Payer: Self-pay | Admitting: Cardiovascular Disease

## 2023-11-14 NOTE — Telephone Encounter (Signed)
 Left patient voicemail to call back to let me know what pharmacy to send meds to.

## 2023-11-14 NOTE — Telephone Encounter (Signed)
 Copied from CRM [id number]. Topic: Clinical - Prescription Issue >> Nov 14, 2023 11:25 AM [name] wrote: Reason for CRM: Pt wife calling due to the pharmacy only filling the prescription for a 30 day supply with no refills for either of the medications and the wife would like to know why there are no refills and why is this only a 30 fill after this was discussed in the appt with Dr Kermit Ped 10/13/23, please send these updates to the [facility] - [place], [place] - [address] [place] [address] Phone: [phone] Fax: [phone]  losartan  (COZAAR ) 50 MG tablet metFORMIN  (GLUCOPHAGE ) 1000 MG tablet  Pt husband num [phone] (M)

## 2023-11-17 ENCOUNTER — Other Ambulatory Visit: Payer: Self-pay

## 2023-11-17 MED ORDER — LOSARTAN POTASSIUM 50 MG PO TABS: 50.0000 mg | ORAL_TABLET | Freq: Every evening | ORAL | 0 refills | Status: DC

## 2023-11-17 NOTE — Telephone Encounter (Signed)
90 day supply was sent to pharmacy

## 2023-12-17 ENCOUNTER — Other Ambulatory Visit: Payer: Self-pay

## 2023-12-17 MED ORDER — GLIMEPIRIDE 4 MG PO TABS: 4.0000 mg | ORAL_TABLET | Freq: Every day | ORAL | 0 refills | Status: DC

## 2024-01-26 ENCOUNTER — Other Ambulatory Visit: Payer: Self-pay | Admitting: Internal Medicine

## 2024-02-11 ENCOUNTER — Other Ambulatory Visit: Payer: Self-pay | Admitting: Student

## 2024-02-11 DIAGNOSIS — I5042 Chronic combined systolic (congestive) and diastolic (congestive) heart failure: Secondary | ICD-10-CM

## 2024-02-11 DIAGNOSIS — I10 Essential (primary) hypertension: Secondary | ICD-10-CM

## 2024-02-11 DIAGNOSIS — I25810 Atherosclerosis of coronary artery bypass graft(s) without angina pectoris: Secondary | ICD-10-CM

## 2024-02-13 ENCOUNTER — Other Ambulatory Visit (HOSPITAL_COMMUNITY): Payer: Self-pay | Admitting: Cardiovascular Disease

## 2024-02-13 DIAGNOSIS — I5042 Chronic combined systolic (congestive) and diastolic (congestive) heart failure: Secondary | ICD-10-CM

## 2024-03-02 ENCOUNTER — Other Ambulatory Visit: Payer: Self-pay | Admitting: Internal Medicine

## 2024-03-15 ENCOUNTER — Ambulatory Visit (HOSPITAL_COMMUNITY): Admitting: Physician Assistant

## 2024-03-16 ENCOUNTER — Other Ambulatory Visit: Payer: Self-pay | Admitting: Cardiovascular Disease

## 2024-03-16 DIAGNOSIS — I4819 Other persistent atrial fibrillation: Secondary | ICD-10-CM

## 2024-03-16 NOTE — Telephone Encounter (Signed)
 Prescription refill request for Eliquis  received. Indication:afib Last office visit:10/24 Scr:0.88  4/25 Age: 63 Weight:104.3  kg  Prescription refilled

## 2024-03-31 ENCOUNTER — Other Ambulatory Visit: Payer: Self-pay | Admitting: Cardiovascular Disease

## 2024-03-31 DIAGNOSIS — I5042 Chronic combined systolic (congestive) and diastolic (congestive) heart failure: Secondary | ICD-10-CM

## 2024-04-12 DIAGNOSIS — E113292 Type 2 diabetes mellitus with mild nonproliferative diabetic retinopathy without macular edema, left eye: Secondary | ICD-10-CM | POA: Diagnosis not present

## 2024-04-12 DIAGNOSIS — H2513 Age-related nuclear cataract, bilateral: Secondary | ICD-10-CM | POA: Diagnosis not present

## 2024-04-12 DIAGNOSIS — E119 Type 2 diabetes mellitus without complications: Secondary | ICD-10-CM | POA: Diagnosis not present

## 2024-04-12 DIAGNOSIS — Q141 Congenital malformation of retina: Secondary | ICD-10-CM | POA: Diagnosis not present

## 2024-04-12 LAB — HM DIABETES EYE EXAM

## 2024-04-19 ENCOUNTER — Ambulatory Visit

## 2024-04-19 ENCOUNTER — Ambulatory Visit: Admitting: Cardiovascular Disease

## 2024-04-19 VITALS — BP 146/79 | HR 71 | Ht 72.0 in | Wt 232.0 lb

## 2024-04-19 DIAGNOSIS — Z23 Encounter for immunization: Secondary | ICD-10-CM

## 2024-04-19 DIAGNOSIS — Z7901 Long term (current) use of anticoagulants: Secondary | ICD-10-CM

## 2024-04-19 DIAGNOSIS — E876 Hypokalemia: Secondary | ICD-10-CM

## 2024-04-19 DIAGNOSIS — E785 Hyperlipidemia, unspecified: Secondary | ICD-10-CM | POA: Diagnosis not present

## 2024-04-19 DIAGNOSIS — I25810 Atherosclerosis of coronary artery bypass graft(s) without angina pectoris: Secondary | ICD-10-CM

## 2024-04-19 DIAGNOSIS — I10 Essential (primary) hypertension: Secondary | ICD-10-CM | POA: Diagnosis not present

## 2024-04-19 DIAGNOSIS — E119 Type 2 diabetes mellitus without complications: Secondary | ICD-10-CM | POA: Diagnosis not present

## 2024-04-19 DIAGNOSIS — I5042 Chronic combined systolic (congestive) and diastolic (congestive) heart failure: Secondary | ICD-10-CM

## 2024-04-19 DIAGNOSIS — Z7984 Long term (current) use of oral hypoglycemic drugs: Secondary | ICD-10-CM

## 2024-04-19 MED ORDER — GLIMEPIRIDE 4 MG PO TABS: 4.0000 mg | ORAL_TABLET | Freq: Every day | ORAL | 3 refills | Status: AC

## 2024-04-19 MED ORDER — ATORVASTATIN CALCIUM 80 MG PO TABS: 80.0000 mg | ORAL_TABLET | Freq: Every day | ORAL | 3 refills | Status: AC

## 2024-04-19 MED ORDER — ISOSORBIDE MONONITRATE ER 30 MG PO TB24: 30.0000 mg | ORAL_TABLET | Freq: Every morning | ORAL | 3 refills | Status: AC

## 2024-04-19 MED ORDER — LOSARTAN POTASSIUM 50 MG PO TABS: 50.0000 mg | ORAL_TABLET | Freq: Every day | ORAL | 3 refills | Status: DC

## 2024-04-19 MED ORDER — METFORMIN HCL 1000 MG PO TABS: 1000.0000 mg | ORAL_TABLET | Freq: Two times a day (BID) | ORAL | 3 refills | Status: AC

## 2024-04-19 MED ORDER — NEBIVOLOL HCL 20 MG PO TABS: 1.0000 | ORAL_TABLET | Freq: Every morning | ORAL | 3 refills | Status: DC

## 2024-04-19 MED ORDER — POTASSIUM CHLORIDE CRYS ER 20 MEQ PO TBCR: 40.0000 meq | EXTENDED_RELEASE_TABLET | Freq: Every day | ORAL | 3 refills | Status: AC

## 2024-04-19 MED ORDER — SITAGLIPTIN PHOSPHATE 100 MG PO TABS: 100.0000 mg | ORAL_TABLET | Freq: Every morning | ORAL | 3 refills | Status: DC

## 2024-04-19 MED ORDER — DAPAGLIFLOZIN PROPANEDIOL 10 MG PO TABS: 10.0000 mg | ORAL_TABLET | Freq: Every morning | ORAL | 3 refills | Status: AC

## 2024-04-19 NOTE — Assessment & Plan Note (Signed)
 He is currently prescribed atorvastatin  80 mg daily. Recheck fasting labs today.

## 2024-04-19 NOTE — Assessment & Plan Note (Signed)
 Currently prescribed Eliquis  5 mg twice a day.  Managed by cardiology, Dr. Francyne.

## 2024-04-19 NOTE — Assessment & Plan Note (Signed)
 Denies recent chest pain. He is followed by cardiology. Currently prescribed atorvastatin , Imdur , and nebivolol  and Eliquis .  No medication changes made today.

## 2024-04-19 NOTE — Assessment & Plan Note (Addendum)
 BP was mildly elevated today. Recent elevated blood pressure of 144/72 mmHg, possibly due to fluid retention from travel and dietary changes. - Encourage increased water intake.No medication changes necessary today.

## 2024-04-19 NOTE — Progress Notes (Signed)
 Established Patient Office Visit  Subjective   Patient ID: [name], male    DOB: 21-Feb-1961  Age: 63 y.o. MRN: [id number]  Chief Complaint  Patient presents with   Medical Management of Chronic Issues    Pt here for 6 month follow up    HPI Discussed the use of AI scribe software for clinical note transcription with the patient, who gave verbal consent to proceed.  History of Present Illness   [name] is a 63 year old male with diabetes who presents for a six-month follow-up.  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 impacted glycemic control - High blood sugar associated with increased fluid retention - No new symptoms since last visit  Cardiovascular disease management - History of cardiovascular disease - Current medications include Tikosyn , Eliquis , and Lasix , managed by cardiology - No changes in cardiovascular symptoms since last visit - Increased fluid retention when blood sugar is elevated, attributed to recent travel and dietary changes  Medication management - Current medications: Tikosyn , Eliquis , Lasix , Lotrisone  cream, potassium supplements, Nabibilol, and Januvia  - Nabibilol and Januvia  filled through [facility] - Recent change from ninety-day to thirty-day refills for some medications, resulting in increased expenses - Request to return to ninety-day refills where possible  Gastrointestinal symptoms - No changes in bowel movements      Patient Active Problem List   Diagnosis Date Noted   Hypokalemia 04/19/2024   Intertrigo 10/13/2023   Arthralgia of multiple sites 10/13/2023   Hematoma of right great toe 10/13/2023   Colon cancer screening 04/15/2023   Need for influenza vaccination 04/15/2023   Hypercoagulable state due to persistent atrial fibrillation  (HCC) 05/29/2021   CAD (coronary artery disease) 05/01/2021   Abnormal echocardiogram    Angina pectoris 09/26/2016   Chest pain of uncertain etiology 09/24/2016   Long term current use of anticoagulant therapy 02/05/2016   Encounter for monitoring dofetilide  therapy 02/05/2016   Chronic combined systolic and diastolic CHF (congestive heart failure) (HCC) 06/19/2015   Persistent atrial fibrillation (HCC) 03/31/2014   Hx of CABG 03/31/2014   Essential hypertension 03/31/2014   Dyslipidemia, goal LDL below 70 03/31/2014   Non-insulin  dependent type 2 diabetes mellitus (HCC) 03/31/2014   Obesity (BMI 30-39.9) 03/31/2014   GASTROESOPHAGEAL REFLUX DISEASE 02/27/2009   ANEMIA, IRON DEFICIENCY, HX OF 02/27/2009    ROS    Objective:     BP (!) 146/79   Pulse 71   Ht 6' (1.829 m)   Wt 232 lb (105.2 kg)   SpO2 95%   BMI 31.46 kg/m  BP Readings from Last 3 Encounters:  04/19/24 (!) 146/79  10/13/23 (!) 151/82  09/08/23 138/88   Wt Readings from Last 3 Encounters:  04/19/24 232 lb (105.2 kg)  10/13/23 230 lb (104.3 kg)  09/08/23 232 lb 12.8 oz (105.6 kg)      Physical Exam Vitals and nursing note reviewed.  Constitutional:      Appearance: Normal appearance.  HENT:     Head: Normocephalic.     Right Ear: Tympanic membrane, ear canal and external ear normal.     Left Ear: Tympanic membrane, ear canal and external ear normal.     Nose: Nose normal.     Mouth/Throat:     Mouth: Mucous membranes are moist.     Pharynx:  Oropharynx is clear.  Cardiovascular:     Rate and Rhythm: Normal rate and regular rhythm.  Pulmonary:     Effort: Pulmonary effort is normal.     Breath sounds: Normal breath sounds.  Musculoskeletal:     Cervical back: Normal range of motion and neck supple.  Skin:    General: Skin is warm and dry.  Neurological:     Mental Status: He is alert and oriented to person, place, and time.  Psychiatric:        Mood and Affect: Mood normal.        Thought  Content: Thought content normal.      No results found for any visits on 04/19/24.    The ASCVD Risk score (Arnett DK, et al., 2019) failed to calculate for the following reasons:   Risk score cannot be calculated because patient has a medical history suggesting prior/existing ASCVD    Assessment & Plan:   Problem List Items Addressed This Visit       Cardiovascular and Mediastinum   Essential hypertension - Primary (Chronic)   BP was mildly elevated today. Recent elevated blood pressure of 144/72 mmHg, possibly due to fluid retention from travel and dietary changes. - Encourage increased water intake.No medication changes necessary today.      Relevant Medications   atorvastatin  (LIPITOR ) 80 MG tablet   isosorbide  mononitrate (IMDUR ) 30 MG 24 hr tablet   losartan  (COZAAR ) 50 MG tablet   Nebivolol  HCl 20 MG TABS   Chronic combined systolic and diastolic CHF (congestive heart failure) (HCC)   Relevant Medications   atorvastatin  (LIPITOR ) 80 MG tablet   dapagliflozin  propanediol (FARXIGA ) 10 MG TABS tablet   isosorbide  mononitrate (IMDUR ) 30 MG 24 hr tablet   losartan  (COZAAR ) 50 MG tablet   Nebivolol  HCl 20 MG TABS   CAD (coronary artery disease)   Denies recent chest pain. He is followed by cardiology. Currently prescribed atorvastatin , Imdur , and nebivolol  and Eliquis .  No medication changes made today.       Relevant Medications   atorvastatin  (LIPITOR ) 80 MG tablet   isosorbide  mononitrate (IMDUR ) 30 MG 24 hr tablet   losartan  (COZAAR ) 50 MG tablet   Nebivolol  HCl 20 MG TABS     Endocrine   Non-insulin  dependent type 2 diabetes mellitus (HCC) (Chronic)   A1c reported 7.6 in April. He is currently prescribed metformin  1000 mg twice daily, glimepiride  4 mg twice daily, Januvia  100 mg daily, and Farxiga  10 mg daily.  He anticipates elevated A1c due to dietary changes while traveling. - Order blood work to assess glucose levels and other parameters. - Refill  medications with 90-day supplies to reduce costs and improve adherence.   Lab Results  Component Value Date   HGBA1C 7.6 (H) 10/13/2023   HGBA1C 7.3 (H) 04/15/2023   HGBA1C 8.6 (H) 09/24/2016         Relevant Medications   atorvastatin  (LIPITOR ) 80 MG tablet   dapagliflozin  propanediol (FARXIGA ) 10 MG TABS tablet   glimepiride  (AMARYL ) 4 MG tablet   losartan  (COZAAR ) 50 MG tablet   metFORMIN  (GLUCOPHAGE ) 1000 MG tablet   sitaGLIPtin  (JANUVIA ) 100 MG tablet   Other Relevant Orders   CMP14+EGFR   Hemoglobin A1c   Microalbumin / creatinine urine ratio     Other   Dyslipidemia, goal LDL below 70 (Chronic)   He is currently prescribed atorvastatin  80 mg daily. Recheck fasting labs today.      Relevant Medications   atorvastatin  (LIPITOR )  80 MG tablet   isosorbide  mononitrate (IMDUR ) 30 MG 24 hr tablet   losartan  (COZAAR ) 50 MG tablet   Nebivolol  HCl 20 MG TABS   Other Relevant Orders   CMP14+EGFR   Lipid panel   Long term current use of anticoagulant therapy (Chronic)   Currently prescribed Eliquis  5 mg twice a day.  Managed by cardiology, Dr. Francyne.      Relevant Orders   CBC with Differential/Platelet   Hypokalemia   Update labs to reassess potassium level      Relevant Medications   potassium chloride  SA (KLOR-CON  M) 20 MEQ tablet   Other Visit Diagnoses       Encounter for immunization       Relevant Orders   Flu vaccine trivalent PF, 6mos and older(Flulaval,Afluria,Fluarix,Fluzone) (Completed)       Return in about 6 months (around 10/18/2024) for chronic follow-up with PCP.    Leita Longs, FNP

## 2024-04-19 NOTE — Assessment & Plan Note (Signed)
 Update labs to reassess potassium level

## 2024-04-19 NOTE — Assessment & Plan Note (Addendum)
 A1c reported 7.6 in April. He is currently prescribed metformin  1000 mg twice daily, glimepiride  4 mg twice daily, Januvia  100 mg daily, and Farxiga  10 mg daily.  He anticipates elevated A1c due to dietary changes while traveling. - Order blood work to assess glucose levels and other parameters. - Refill medications with 90-day supplies to reduce costs and improve adherence.   Lab Results  Component Value Date   HGBA1C 7.6 (H) 10/13/2023   HGBA1C 7.3 (H) 04/15/2023   HGBA1C 8.6 (H) 09/24/2016

## 2024-04-23 ENCOUNTER — Ambulatory Visit: Admitting: Cardiovascular Disease

## 2024-04-23 ENCOUNTER — Other Ambulatory Visit: Payer: Self-pay

## 2024-04-23 DIAGNOSIS — E11319 Type 2 diabetes mellitus with unspecified diabetic retinopathy without macular edema: Secondary | ICD-10-CM | POA: Insufficient documentation

## 2024-06-09 ENCOUNTER — Other Ambulatory Visit: Payer: Self-pay | Admitting: Cardiovascular Disease

## 2024-06-30 ENCOUNTER — Encounter: Payer: Self-pay | Admitting: Cardiovascular Disease

## 2024-06-30 ENCOUNTER — Ambulatory Visit: Admitting: Cardiovascular Disease

## 2024-06-30 VITALS — BP 134/76 | HR 74 | Ht 72.0 in | Wt 232.0 lb

## 2024-06-30 DIAGNOSIS — E785 Hyperlipidemia, unspecified: Secondary | ICD-10-CM

## 2024-06-30 DIAGNOSIS — Z79899 Other long term (current) drug therapy: Secondary | ICD-10-CM

## 2024-06-30 DIAGNOSIS — D6869 Other thrombophilia: Secondary | ICD-10-CM

## 2024-06-30 DIAGNOSIS — I25810 Atherosclerosis of coronary artery bypass graft(s) without angina pectoris: Secondary | ICD-10-CM

## 2024-06-30 DIAGNOSIS — Z5181 Encounter for therapeutic drug level monitoring: Secondary | ICD-10-CM

## 2024-06-30 DIAGNOSIS — I5032 Chronic diastolic (congestive) heart failure: Secondary | ICD-10-CM | POA: Diagnosis not present

## 2024-06-30 DIAGNOSIS — Z7901 Long term (current) use of anticoagulants: Secondary | ICD-10-CM | POA: Diagnosis not present

## 2024-06-30 DIAGNOSIS — E119 Type 2 diabetes mellitus without complications: Secondary | ICD-10-CM | POA: Diagnosis not present

## 2024-06-30 DIAGNOSIS — I480 Paroxysmal atrial fibrillation: Secondary | ICD-10-CM

## 2024-06-30 DIAGNOSIS — I10 Essential (primary) hypertension: Secondary | ICD-10-CM | POA: Diagnosis not present

## 2024-06-30 MED ORDER — SACUBITRIL-VALSARTAN 49-51 MG PO TABS: 1.0000 | ORAL_TABLET | Freq: Two times a day (BID) | ORAL | 11 refills | Status: AC

## 2024-06-30 NOTE — Progress Notes (Signed)
 "   Cardiology Office Note    Date:  06/30/2024   ID:  [name], [name] [birth year], MRN [id number]  PCP:  Bevely Doffing, FNP  Cardiologist:   Jerel Balding, MD   Chief Complaint  Patient presents with   Coronary Artery Disease   Atrial Fibrillation    History of Present Illness:  [name] is a 63 y.o. male who presents for follow-up for early onset CAD, history of LV dysfunction with recovered ejection fraction, atrial fibrillation s/p ablation.  He is doing quite well.  He denies any problems with angina or dyspnea at rest or with activity.  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  He is also taking classes and woodworking.  Occasionally still works on the farm.  Gave up regular working at [employer].  Continues to take furosemide  on a daily basis, usually just 40 mg a day, occasional take a second dose in the afternoon if he feels that he is gaining fluid.  He monitors his weight regularly and usually notices abdominal distention rather than lower extremity edema.  Usually only requires a second dose of furosemide  every couple of weeks or so.  He had excellent relief of his atrial fibrillation after ablation, but then had to start taking dofetilide  for recurrent arrhythmia.  He has palpitations on a daily basis but these are usually brief, they make him occasionally a little bit dizzy, but he has not experienced near-syncope or syncope.  They did not lead to angina or dyspnea.  Overall his arrhythmia control on dofetilide  is satisfactory.  He has not had any bleeding issues on Eliquis  and denies any falls or serious injuries.  Most recent evaluation of LV function showed normal EF at 55-60% by the TEE in November 2022.  During evaluation for repeat ablation for atrial fibrillation he was found to have reduction in LV ejection fraction down to 30-35%.  Subsequent work-up showed presence of new coronary stenoses.  Initial revascularization  procedure on 05/01/2021 was a successful stent to the SVG-OM, but did not really provide any improvement in symptoms.  He returned on 05/16/2021 and underwent rotational atherectomy and eluding stenting of the left main coronary artery (Onyx frontier 3.5x26) and proximal left circumflex coronary artery (Onyx frontier 3.5x26).  Both procedures were performed by Dr. Dann.  After the second revascularization procedure he feels substantially improved.  In fact he feels better than he did after bypass surgery which he never felt provided complete relief of symptoms.  After revascularization was completed he started treatment with dofetilide  and underwent TEE guided cardioversion on 05/29/2021.  Per that study his ejection fraction had improved to 55-60%.  Metabolic control is better.  His hemoglobin A1c is 7.6%.  LDL is excellent at 45, but he has a chronically low HDL at 25.  Triglycerides are normal.  He has normal renal function.   He first presented with coronary disease at age 31 (bare-metal [id number] stent to proximal LAD in the setting of acute myocardial infarction) and had recurrent problems in 2007 (right coronary artery drug-eluting Taxus stents to the proximal RCA 3.0x12 and the mid PLA 2.5x16, followed by same admission placement of 2 drug-eluting Cypher 3.0x13 stents to the mid LAD). Bypass surgery was performed after repeat cardiac catheterization October 2008 showed 50% proximal LAD stent restenosis followed by aneurysmal dilatation and an 85% stenosis of the LAD involving the first diagonal branch as well as moderate lesions in the  left circumflex and right coronary artery. He  underwent four-vessel bypass surgery in 2008. (Dr. Lucas, LIMA to LAD, SVG to first diagonal, SVG to first OM, SVG to PDA).  EF was normal until 2022 when it dropped to 35%.  Coronary angiography showed all 4 grafts were patent but he had new stenoses in the SVG to OM, upstream in the OM artery preventing retrograde flow  to the left circumflex, high-grade stenoses in the left main and proximal left circumflex coronary arteries.  He underwent placement of a drug-eluting stents to the SVG-OM and atherectomy with drug-eluting stent to the left main coronary artery and proximal left circumflex coronary artery November 2022.  LVEF recovered to 55-60%.  He described classical angina pectoris and shortness of breath before his coronary events. (substernal pressure and burning, different from his reflux pain).   He did have postoperative paroxysmal atrial fibrillation, recurred in 2014, recorded incidentally during a routine exam. He is aware of the palpitations and thinks that he does a little worse when he is in the arrhythmia. Rate control is excellent, due to chronic treatment with beta blockers. He does not have a history of stroke or TIA or other embolic events and does not have a known bleeding tendency or abnormal bleeding. CHADS2VASC score: at least 3. He failed Multaq  therapy but did well for a while on Tikosyn . Underwent RF ablation (Allred) in March 2019 and dofetilide  was stopped.  Had recurrent atrial fibrillation 2022.  During evaluation for repeat ablation he was found to have decreased LVEF leading to repeat coronary revascularization.  He was restarted on dofetilide  and underwent cardioversion in November 2022.  Additional problems include type 2 diabetes mellitus, obesity, hypertension and gastroesophageal reflux disease. He has mixed hyperlipidemia. He is a former smoker. He may have a history of asbestos exposure when he worked in the [employer]. He continues to have 2 full-time jobs. 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   Past Medical History:  Diagnosis Date   Abdominal distention    Anemia    Postoperative   Chronic diastolic CHF (congestive heart failure) (HCC)    Coronary artery disease    a. premature - acute MI age 9 s/p BMS to prox LAD. b. s/p DES to RCA, PLA, mLAD in 2007.  c. 4V CABG in 2008 with LIMA to LAD, SVG to first diagonal, SVG to first OM, SVG to PDA   Diabetes (HCC)    Edema    H/O gastroesophageal reflux (GERD)    High cholesterol    Hypertension    Myocardial infarction (HCC)    Obesity    Persistent atrial fibrillation (HCC)    a. on Tikosyn , Xarelto .   Pleural effusion    resolved with therapy    Past Surgical History:  Procedure Laterality Date   ABLATION OF DYSRHYTHMIC FOCUS  09/23/2017   ATRIAL FIBRILLATION ABLATION N/A 09/23/2017   Procedure: ATRIAL FIBRILLATION ABLATION;  Surgeon: Kelsie Agent, MD;  Location: [facility] INVASIVE CV LAB;  Service: Cardiovascular;  Laterality: N/A;   BUBBLE STUDY  05/29/2021   Procedure: BUBBLE STUDY;  Surgeon: Mona Vinie BROCKS, MD;  Location: [facility] ENDOSCOPY;  Service: Cardiovascular;;   CARDIAC CATHETERIZATION  2008   CARDIOVERSION N/A 05/29/2021   Procedure: CARDIOVERSION;  Surgeon: Mona Vinie BROCKS, MD;  Location: [facility] ENDOSCOPY;  Service: Cardiovascular;  Laterality: N/A;   COLONOSCOPY WITH PROPOFOL  N/A 07/07/2023   Procedure: COLONOSCOPY WITH PROPOFOL ;  Surgeon: Cindie Carlin POUR, DO;  Location: [facility]  ENDO SUITE;  Service: Endoscopy;  Laterality: N/A;  830am, asa 3   CORONARY ANGIOPLASTY  1996   CORONARY ARTERY BYPASS GRAFT  2008   LIMA-LAD, SVG-Dx, SVG-OM1, SVG-PDA   CORONARY ATHERECTOMY N/A 05/16/2021   Procedure: CORONARY ATHERECTOMY;  Surgeon: Dann Candyce RAMAN, MD;  Location: [facility] INVASIVE CV LAB;  Service: Cardiovascular;  Laterality: N/A;   CORONARY STENT INTERVENTION N/A 05/01/2021   Procedure: CORONARY STENT INTERVENTION;  Surgeon: Dann Candyce RAMAN, MD;  Location: [facility] INVASIVE CV LAB;  Service: Cardiovascular;  Laterality: N/A;   CORONARY STENT INTERVENTION N/A 05/16/2021   Procedure: CORONARY STENT INTERVENTION;  Surgeon: Dann Candyce RAMAN, MD;  Location: [facility] INVASIVE CV LAB;  Service: Cardiovascular;  Laterality: N/A;   CORONARY ULTRASOUND/IVUS N/A 05/16/2021   Procedure: Intravascular  Ultrasound/IVUS;  Surgeon: Dann Candyce RAMAN, MD;  Location: [facility] INVASIVE CV LAB;  Service: Cardiovascular;  Laterality: N/A;   LEFT HEART CATH N/A 05/16/2021   Procedure: Left Heart Cath;  Surgeon: Dann Candyce RAMAN, MD;  Location: [facility] INVASIVE CV LAB;  Service: Cardiovascular;  Laterality: N/A;   LEFT HEART CATH AND CORS/GRAFTS ANGIOGRAPHY N/A 09/25/2016   Procedure: Left Heart Cath and Cors/Grafts Angiography;  Surgeon: Candyce RAMAN Dann, MD;  Location: [facility] INVASIVE CV LAB;  Service: Cardiovascular;  Laterality: N/A;   LEFT HEART CATH AND CORS/GRAFTS ANGIOGRAPHY N/A 05/01/2021   Procedure: LEFT HEART CATH AND CORS/GRAFTS ANGIOGRAPHY;  Surgeon: Dann Candyce RAMAN, MD;  Location: [facility] INVASIVE CV LAB;  Service: Cardiovascular;  Laterality: N/A;   POLYPECTOMY  07/07/2023   Procedure: POLYPECTOMY;  Surgeon: Cindie Carlin POUR, DO;  Location: [facility] ENDO SUITE;  Service: Endoscopy;;   TEE WITHOUT CARDIOVERSION N/A 05/29/2021   Procedure: TRANSESOPHAGEAL ECHOCARDIOGRAM (TEE);  Surgeon: Mona Vinie BROCKS, MD;  Location: [facility] ENDOSCOPY;  Service: Cardiovascular;  Laterality: N/A;    Current Medications: Outpatient Medications Prior to Visit  Medication Sig Dispense Refill   atorvastatin  (LIPITOR ) 80 MG tablet Take 1 tablet (80 mg total) by mouth at bedtime. 90 tablet 3   clotrimazole -betamethasone  (LOTRISONE ) cream Apply 1 Application topically daily. 30 g 0   dapagliflozin  propanediol (FARXIGA ) 10 MG TABS tablet Take 1 tablet (10 mg total) by mouth in the morning. 90 tablet 3   dofetilide  (TIKOSYN ) 500 MCG capsule TAKE ONE (1) CAPSULE BY MOUTH TWICE A DAY. (EVERY 12 HOURS.) 60 capsule 0   ELIQUIS  5 MG TABS tablet TAKE ONE TABLET (5MG  TOTAL) BY MOUTH TWOTIMES DAILY 60 tablet 11   furosemide  (LASIX ) 40 MG tablet Take 1 tablet (40 mg total) by mouth 2 (two) times daily as needed for fluid. 180 tablet 0   glimepiride  (AMARYL ) 4 MG tablet Take 1 tablet (4 mg total) by mouth daily with breakfast. 180 tablet 3    glucose blood (ACCU-CHEK AVIVA PLUS) test strip Use as instructed 100 each 12   isosorbide  mononitrate (IMDUR ) 30 MG 24 hr tablet Take 1 tablet (30 mg total) by mouth in the morning. 90 tablet 3   metFORMIN  (GLUCOPHAGE ) 1000 MG tablet Take 1 tablet (1,000 mg total) by mouth 2 (two) times daily with a meal. 180 tablet 3   Multiple Vitamins-Minerals (MULTIVITAMIN WITH MINERALS) tablet Take 1 tablet by mouth 2 (two) times daily.      Nebivolol  HCl 20 MG TABS Take 1 tablet (20 mg total) by mouth every morning. 90 tablet 3   nitroGLYCERIN  (NITROSTAT ) 0.4 MG SL tablet Place 1 tablet (0.4 mg total) under the tongue every 5 (five) minutes as needed for chest pain.  25 tablet 4   potassium chloride  SA (KLOR-CON  M) 20 MEQ tablet Take 2 tablets (40 mEq total) by mouth daily. 90 tablet 3   sitaGLIPtin  (JANUVIA ) 100 MG tablet Take 1 tablet (100 mg total) by mouth in the morning. 90 tablet 3   losartan  (COZAAR ) 50 MG tablet Take 1 tablet (50 mg total) by mouth daily. 90 tablet 3   No facility-administered medications prior to visit.     Allergies:   Patient has no known allergies.       Family History:   significant for early onset coronary disease  ROS:   Please see the history of present illness.    ROS All other systems reviewed and are negative.   PHYSICAL EXAM:   VS:  BP 134/76 (BP Location: Left Arm, Patient Position: Sitting, Cuff Size: Large)   Pulse 74   Ht 6' (1.829 m)   Wt 232 lb (105.2 kg)   SpO2 99%   BMI 31.46 kg/m      General: Alert, oriented x3, no distress, mildly obese Head: no evidence of trauma, PERRL, EOMI, no exophtalmos or lid lag, no myxedema, no xanthelasma; normal ears, nose and oropharynx Neck: normal jugular venous pulsations and no hepatojugular reflux; brisk carotid pulses without delay and no carotid bruits Chest: clear to auscultation, no signs of consolidation by percussion or palpation, normal fremitus, symmetrical and full respiratory  excursions Cardiovascular: normal position and quality of the apical impulse, regular rhythm, normal first and second heart sounds, no murmurs, rubs or gallops Abdomen: no tenderness or distention, no masses by palpation, no abnormal pulsatility or arterial bruits, normal bowel sounds, no hepatosplenomegaly Extremities: no clubbing, cyanosis or edema; 2+ radial, ulnar and brachial pulses bilaterally; 2+ right femoral, posterior tibial and dorsalis pedis pulses; 2+ left femoral, posterior tibial and dorsalis pedis pulses; no subclavian or femoral bruits Neurological: grossly nonfocal Psych: Normal mood and affect     Wt Readings from Last 3 Encounters:  06/30/24 232 lb (105.2 kg)  04/19/24 232 lb (105.2 kg)  10/13/23 230 lb (104.3 kg)      Studies/Labs Reviewed:   Cardiac catheterization 05/01/2021; Origin to Prox Graft lesion is 25% stenosed.   RPDA lesion is 80% stenosed.  SVG to PDA is patent.   Mid LAD lesion is 90% stenosed at large diagonal.  LIMA to LAD is patent.  SVG to diagonal is patent.   Ost LM to Mid LM lesion is 95% stenosed.   Ost Cx to Prox Cx lesion is 80% stenosed.   Prox RCA lesion is 50% stenosed.   RPAV lesion is 70% stenosed. Relatively small territory supplied.   2nd Mrg lesion is 100% stenosed.  SVG to OM2 Origin to Prox Graft lesion is 75% stenosed.   A drug-eluting stent was successfully placed in the SVG using a STENT ONYX FRONTIER 3.5X15, postdilated to 4.0 mm proximally.   Post intervention, there is a 0% residual stenosis.   There is mild left ventricular systolic dysfunction.   LV end diastolic pressure is mildly elevated.   The left ventricular ejection fraction is 35-45% by visual estimate.   There is no aortic valve stenosis.   Successful PCI of the SVG to OM.  This OM, on previous cath films, connected to the remainder of the circumflex and filled it retrograde.  Now the ostium of the OM is occluded and there is no retrograde filling of the  circumflex.  His left main disease has progressed as well.   Plan  for PCI of the left main and proximal circumflex.  This will likely require atherectomy or some other calcium  modifying technique.  Could use right radial approach as we would not need to engage any bypass grafts.   Restart Eliquis  tomorrow.  The patient was loaded with clopidogrel  today.  To minimize bleeding risk, will hold off on giving him aspirin  at this time.   Plan for same-day discharge if there are no bleeding issues.   Diagnostic Dominance: Right Intervention  Implants     Permanent Stent  Stent Onyx Frontier 3.5x15      Cardiac catheterization 05/16/2021 :     Ost LM to Mid LM lesion is 95% stenosed.   Prox Cx lesion is 80% stenosed.   A drug-eluting stent was successfully placed using a STENT ONYX FRONTIER 3.5X26, postdilated to greater than 4 mm and optimized with intravascular ultrasound.  This one stent covered both the left main and the circumflex lesions.   Post intervention, there is a 0% residual stenosis.   Ost LAD to Prox LAD lesion is 90% stenosed.  Known patent LIMA to LAD.  Flow remained in the LAD after the stent was placed across the ostium.   A drug-eluting stent was successfully placed using a STENT ONYX FRONTIER 3.5X26.   Post intervention, there is a 0% residual stenosis.   LV end diastolic pressure is mildly elevated.   There is no aortic valve stenosis.   Restart Eliquis  tomorrow.  Continue clopidogrel  monotherapy.  No aspirin  to decrease bleeding risk.  Given his extensive CAD, he may require ongoing antiplatelet therapy in the setting of Eliquis .  Diagnostic Dominance: Right Intervention  Implants     Permanent Stent  Stent Onyx Frontier 3.5x26      TEE 05/29/2021:    1. Left ventricular ejection fraction, by estimation, is 55 to 60%. The  left ventricle has normal function. There is mild left ventricular  hypertrophy.   2. Right ventricular systolic function is mildly  reduced. The right  ventricular size is normal.   3. Left atrial size was mildly dilated. No left atrial/left atrial  appendage thrombus was detected.   4. Right atrial size was mildly dilated.   5. The mitral valve is grossly normal. No evidence of mitral valve  regurgitation.   6. The aortic valve is tricuspid. Aortic valve regurgitation is not  visualized.   7. Agitated saline contrast bubble study was negative, with no evidence  of any interatrial shunt.   Conclusion(s)/Recommendation(s): No LA/LAA thrombus identified. Successful  cardioversion performed with restoration of normal sinus rhythm.   EKG:    EKG Interpretation Date/Time:  Wednesday June 30 2024 07:58:22 EST Ventricular Rate:  73 PR Interval:  154 QRS Duration:  92 QT Interval:  376 QTC Calculation: 414 R Axis:   94  Text Interpretation: Normal sinus rhythm Rightward axis Abnormal QRS-T angle, consider primary T wave abnormality When compared with ECG of 08-Sep-2023 10:01, Premature ventricular complexes are no longer Present Premature supraventricular complexes are no longer Present Confirmed by Havanna Groner ([id number]) on 06/30/2024 8:10:43 AM         Recent Labs: 09/08/2023: Magnesium  2.1 10/13/2023: ALT 26; BUN 18; Creatinine, Ser 0.88; Hemoglobin 14.3; Platelets 222; Potassium 4.5; Sodium 137; TSH 0.583   Lipid Panel    Component Value Date/Time   CHOL 84 (L) 10/13/2023 0910   TRIG 60 10/13/2023 0910   HDL 25 (L) 10/13/2023 0910   CHOLHDL 3.4 10/13/2023 0910   CHOLHDL 4.0 09/24/2016  1345   VLDL 19 09/24/2016 1345   LDLCALC 45 10/13/2023 0910     ASSESSMENT:    1. Paroxysmal atrial fibrillation (HCC)   2. Chronic heart failure with preserved ejection fraction (HFpEF) (HCC)   3. Coronary artery disease involving coronary bypass graft of native heart without angina pectoris   4. Dyslipidemia (high LDL; low HDL)   5. Non-insulin  dependent type 2 diabetes mellitus (HCC)   6. Essential  hypertension   7. Acquired thrombophilia   8. Encounter for monitoring dofetilide  therapy      PLAN:  In order of problems listed above:  AFib: Symptoms satisfactorily controlled after ablation and on treatment with dofetilide , although he still has daily palpitations..  On anticoagulation with Eliquis  which causes fewer intestinal problems then Xarelto  did.  CHA2DS2-VASc 4 (hypertension, diabetes, CAD, history of heart failure). CHF: History of LV systolic dysfunction, with recovered EF.  Currently NYHA functional class I and clinically euvolemic.  Continues to have a dry weight around 230 pounds.  On beta-blocker, angiotensin receptor blocker, SGLT2 inhibitor and a relatively low dose of loop diuretic, which he appropriately adjusts for fluid gain.  On ARB, but would probably gain additional long-term benefit from Entresto.  Will switch from losartan  to sacubitril-valsartan 49-51 mg twice daily. CAD: Asymptomatic.  Not on aspirin  due to full anticoagulation.  On Bystolic , which she tolerates better than conventional beta-blockers. HLP: Excellent LDL cholesterol, chronic very low HDL cholesterol which is likely inherited.  Weight loss would be beneficial.  Continue atorvastatin  maximum dose. DM: Ideally would like his hemoglobin A1c less than 7%.  He has gained some weight around the holidays.  On SGLT2 inhibitor.  Also on sulfonylurea and Januvia .  I think he is an excellent candidate for GLP-1 agonists, which would be associate with better long-term cardiovascular outcome compared with sulfonylurea. HTN: Very well-controlled on ARB and beta-blocker. Anticoagulation: No bleeding problems Dofetilide : QTc in normal range.  We discussed the risk of drug interactions and the importance of maintaining normal electrolytes.    Medication Adjustments/Labs and Tests Ordered: Current medicines are reviewed at length with the patient today.  Concerns regarding medicines are outlined above.  Medication  changes, Labs and Tests ordered today are listed in the Patient Instructions below. Patient Instructions  Medication Instructions:  Stop Losartan  Start Entresto 49-51 mg twice a day *If you need a refill on your cardiac medications before your next appointment, please call your pharmacy*  Lab Work: Already ordered lab work- please have drawn today If you have labs (blood work) drawn today and your tests are completely normal, you will receive your results only by: MyChart Message (if you have MyChart) OR A paper copy in the mail If you have any lab test that is abnormal or we need to change your treatment, we will call you to review the results.  Testing/Procedures: None ordered  Follow-Up: At [facility], you and your health needs are our priority.  As part of our continuing mission to provide you with exceptional heart care, our providers are all part of one team.  This team includes your primary Cardiologist (physician) and Advanced Practice Providers or APPs (Physician Assistants and Nurse Practitioners) who all work together to provide you with the care you need, when you need it.  Your next appointment:   6 month(s)  Provider:   Jerel Balding, MD    We recommend signing up for the patient portal called MyChart.  Sign up information is provided on this After  Visit Summary.  MyChart is used to connect with patients for Virtual Visits (Telemedicine).  Patients are able to view lab/test results, encounter notes, upcoming appointments, etc.  Non-urgent messages can be sent to your provider as well.   To learn more about what you can do with MyChart, go to [email or web address].      Signed, Jerel Balding, MD  06/30/2024 11:56 AM    [facility] [address] [place], [place], [place]  [address] Phone: [phone]; Fax: [phone]   "

## 2024-06-30 NOTE — Patient Instructions (Signed)
 Medication Instructions:  Stop Losartan  Start Entresto 49-51 mg twice a day *If you need a refill on your cardiac medications before your next appointment, please call your pharmacy*  Lab Work: Already ordered lab work- please have drawn today If you have labs (blood work) drawn today and your tests are completely normal, you will receive your results only by: MyChart Message (if you have MyChart) OR A paper copy in the mail If you have any lab test that is abnormal or we need to change your treatment, we will call you to review the results.  Testing/Procedures: None ordered  Follow-Up: At [facility], you and your health needs are our priority.  As part of our continuing mission to provide you with exceptional heart care, our providers are all part of one team.  This team includes your primary Cardiologist (physician) and Advanced Practice Providers or APPs (Physician Assistants and Nurse Practitioners) who all work together to provide you with the care you need, when you need it.  Your next appointment:   6 month(s)  Provider:   Jerel Balding, MD    We recommend signing up for the patient portal called MyChart.  Sign up information is provided on this After Visit Summary.  MyChart is used to connect with patients for Virtual Visits (Telemedicine).  Patients are able to view lab/test results, encounter notes, upcoming appointments, etc.  Non-urgent messages can be sent to your provider as well.   To learn more about what you can do with MyChart, go to [email or web address].

## 2024-07-02 LAB — MICROALBUMIN / CREATININE URINE RATIO
Creatinine, Urine: 91.2 mg/dL
Microalb/Creat Ratio: 58 mg/g{creat} — ABNORMAL HIGH (ref 0–29)
Microalbumin, Urine: 53.3 ug/mL

## 2024-07-02 LAB — CMP14+EGFR
ALT: 26 IU/L (ref 0–44)
AST: 16 IU/L (ref 0–40)
Albumin: 4.6 g/dL (ref 3.9–4.9)
Alkaline Phosphatase: 42 IU/L — ABNORMAL LOW (ref 47–123)
BUN/Creatinine Ratio: 22 (ref 10–24)
BUN: 20 mg/dL (ref 8–27)
Bilirubin Total: 0.4 mg/dL (ref 0.0–1.2)
CO2: 21 mmol/L (ref 20–29)
Calcium: 9.6 mg/dL (ref 8.6–10.2)
Chloride: 103 mmol/L (ref 96–106)
Creatinine, Ser: 0.93 mg/dL (ref 0.76–1.27)
Globulin, Total: 2.6 g/dL (ref 1.5–4.5)
Glucose: 176 mg/dL — ABNORMAL HIGH (ref 70–99)
Potassium: 4.1 mmol/L (ref 3.5–5.2)
Sodium: 138 mmol/L (ref 134–144)
Total Protein: 7.2 g/dL (ref 6.0–8.5)
eGFR: 93 mL/min/1.73

## 2024-07-02 LAB — CBC WITH DIFFERENTIAL/PLATELET
Basophils Absolute: 0.1 x10E3/uL (ref 0.0–0.2)
Basos: 1 %
EOS (ABSOLUTE): 0.2 x10E3/uL (ref 0.0–0.4)
Eos: 2 %
Hematocrit: 42.2 % (ref 37.5–51.0)
Hemoglobin: 13.8 g/dL (ref 13.0–17.7)
Immature Grans (Abs): 0.1 x10E3/uL (ref 0.0–0.1)
Immature Granulocytes: 1 %
Lymphocytes Absolute: 2.3 x10E3/uL (ref 0.7–3.1)
Lymphs: 23 %
MCH: 29.4 pg (ref 26.6–33.0)
MCHC: 32.7 g/dL (ref 31.5–35.7)
MCV: 90 fL (ref 79–97)
Monocytes Absolute: 0.8 x10E3/uL (ref 0.1–0.9)
Monocytes: 8 %
Neutrophils Absolute: 6.6 x10E3/uL (ref 1.4–7.0)
Neutrophils: 65 %
Platelets: 213 x10E3/uL (ref 150–450)
RBC: 4.69 x10E6/uL (ref 4.14–5.80)
RDW: 13.3 % (ref 11.6–15.4)
WBC: 10 x10E3/uL (ref 3.4–10.8)

## 2024-07-02 LAB — LIPID PANEL
Chol/HDL Ratio: 3.3 ratio (ref 0.0–5.0)
Cholesterol, Total: 87 mg/dL — ABNORMAL LOW (ref 100–199)
HDL: 26 mg/dL — ABNORMAL LOW
LDL Chol Calc (NIH): 44 mg/dL (ref 0–99)
Triglycerides: 81 mg/dL (ref 0–149)
VLDL Cholesterol Cal: 17 mg/dL (ref 5–40)

## 2024-07-02 LAB — HEMOGLOBIN A1C
Est. average glucose Bld gHb Est-mCnc: 200 mg/dL
Hgb A1c MFr Bld: 8.6 % — ABNORMAL HIGH (ref 4.8–5.6)

## 2024-07-07 ENCOUNTER — Other Ambulatory Visit: Payer: Self-pay

## 2024-07-07 DIAGNOSIS — I10 Essential (primary) hypertension: Secondary | ICD-10-CM

## 2024-07-07 DIAGNOSIS — I5042 Chronic combined systolic (congestive) and diastolic (congestive) heart failure: Secondary | ICD-10-CM

## 2024-07-07 DIAGNOSIS — I25810 Atherosclerosis of coronary artery bypass graft(s) without angina pectoris: Secondary | ICD-10-CM

## 2024-07-07 MED ORDER — NEBIVOLOL HCL 20 MG PO TABS: 1.0000 | ORAL_TABLET | Freq: Every morning | ORAL | 3 refills | Status: AC

## 2024-07-12 ENCOUNTER — Other Ambulatory Visit: Payer: Self-pay

## 2024-07-12 ENCOUNTER — Other Ambulatory Visit: Payer: Self-pay | Admitting: Cardiovascular Disease

## 2024-07-13 ENCOUNTER — Ambulatory Visit: Payer: Self-pay

## 2024-07-30 ENCOUNTER — Other Ambulatory Visit: Payer: Self-pay

## 2024-07-30 DIAGNOSIS — E119 Type 2 diabetes mellitus without complications: Secondary | ICD-10-CM

## 2024-07-30 MED ORDER — SITAGLIPTIN PHOSPHATE 100 MG PO TABS: 100.0000 mg | ORAL_TABLET | Freq: Every morning | ORAL | 0 refills | Status: AC

## 2024-10-18 ENCOUNTER — Ambulatory Visit
